# Patient Record
Sex: Female | Born: 1955
Health system: Southern US, Community
[De-identification: ages and names within clinical notes are randomized; demographics above are authoritative.]

## PROBLEM LIST (undated history)

## (undated) DIAGNOSIS — K219 Gastro-esophageal reflux disease without esophagitis: Secondary | ICD-10-CM

## (undated) DIAGNOSIS — K222 Esophageal obstruction: Secondary | ICD-10-CM

## (undated) DIAGNOSIS — F32A Depression, unspecified: Secondary | ICD-10-CM

## (undated) DIAGNOSIS — M1990 Unspecified osteoarthritis, unspecified site: Secondary | ICD-10-CM

## (undated) DIAGNOSIS — R519 Headache, unspecified: Secondary | ICD-10-CM

## (undated) DIAGNOSIS — K5900 Constipation, unspecified: Secondary | ICD-10-CM

## (undated) DIAGNOSIS — Z8489 Family history of other specified conditions: Secondary | ICD-10-CM

## (undated) DIAGNOSIS — E559 Vitamin D deficiency, unspecified: Secondary | ICD-10-CM

## (undated) DIAGNOSIS — E538 Deficiency of other specified B group vitamins: Secondary | ICD-10-CM

## (undated) DIAGNOSIS — G500 Trigeminal neuralgia: Secondary | ICD-10-CM

## (undated) DIAGNOSIS — T7840XA Allergy, unspecified, initial encounter: Secondary | ICD-10-CM

## (undated) DIAGNOSIS — H269 Unspecified cataract: Secondary | ICD-10-CM

## (undated) DIAGNOSIS — F419 Anxiety disorder, unspecified: Secondary | ICD-10-CM

## (undated) DIAGNOSIS — R51 Headache: Secondary | ICD-10-CM

## (undated) DIAGNOSIS — F329 Major depressive disorder, single episode, unspecified: Secondary | ICD-10-CM

## (undated) DIAGNOSIS — H35342 Macular cyst, hole, or pseudohole, left eye: Secondary | ICD-10-CM

## (undated) DIAGNOSIS — K760 Fatty (change of) liver, not elsewhere classified: Secondary | ICD-10-CM

## (undated) HISTORY — DX: Vitamin D deficiency, unspecified: E55.9

## (undated) HISTORY — DX: Anxiety disorder, unspecified: F41.9

## (undated) HISTORY — PX: ESOPHAGOGASTRODUODENOSCOPY (EGD) WITH ESOPHAGEAL DILATION: SHX5812

## (undated) HISTORY — DX: Esophageal obstruction: K22.2

## (undated) HISTORY — PX: OTHER SURGICAL HISTORY: SHX169

## (undated) HISTORY — PX: APPENDECTOMY: SHX54

## (undated) HISTORY — PX: COLONOSCOPY: SHX174

## (undated) HISTORY — PX: EYE SURGERY: SHX253

## (undated) HISTORY — DX: Allergy, unspecified, initial encounter: T78.40XA

## (undated) HISTORY — DX: Trigeminal neuralgia: G50.0

## (undated) HISTORY — DX: Deficiency of other specified B group vitamins: E53.8

## (undated) HISTORY — DX: Depression, unspecified: F32.A

## (undated) HISTORY — DX: Major depressive disorder, single episode, unspecified: F32.9

## (undated) HISTORY — PX: ABDOMINAL HYSTERECTOMY: SHX81

---

## 2010-09-08 ENCOUNTER — Other Ambulatory Visit (HOSPITAL_COMMUNITY): Payer: Self-pay | Admitting: Podiatry

## 2010-09-08 DIAGNOSIS — M79672 Pain in left foot: Secondary | ICD-10-CM

## 2010-09-08 DIAGNOSIS — M8430XA Stress fracture, unspecified site, initial encounter for fracture: Secondary | ICD-10-CM

## 2010-09-15 ENCOUNTER — Encounter (HOSPITAL_COMMUNITY)
Admission: RE | Admit: 2010-09-15 | Discharge: 2010-09-15 | Disposition: A | Discharge status: Home / Self Care | Payer: BC Managed Care – PPO | Source: Ambulatory Visit | Attending: Podiatry | Admitting: Podiatry

## 2010-09-15 ENCOUNTER — Other Ambulatory Visit (HOSPITAL_COMMUNITY): Payer: Self-pay | Admitting: Podiatry

## 2010-09-15 ENCOUNTER — Ambulatory Visit (HOSPITAL_COMMUNITY)
Admission: RE | Admit: 2010-09-15 | Discharge: 2010-09-15 | Disposition: A | Discharge status: Home / Self Care | Payer: BC Managed Care – PPO | Source: Ambulatory Visit | Attending: Podiatry | Admitting: Podiatry

## 2010-09-15 DIAGNOSIS — M8430XA Stress fracture, unspecified site, initial encounter for fracture: Secondary | ICD-10-CM

## 2010-09-15 DIAGNOSIS — M79672 Pain in left foot: Secondary | ICD-10-CM

## 2010-09-15 DIAGNOSIS — S92309A Fracture of unspecified metatarsal bone(s), unspecified foot, initial encounter for closed fracture: Secondary | ICD-10-CM | POA: Insufficient documentation

## 2010-09-15 DIAGNOSIS — X58XXXA Exposure to other specified factors, initial encounter: Secondary | ICD-10-CM | POA: Insufficient documentation

## 2010-09-15 DIAGNOSIS — M79609 Pain in unspecified limb: Secondary | ICD-10-CM | POA: Insufficient documentation

## 2010-09-15 DIAGNOSIS — W208XXA Other cause of strike by thrown, projected or falling object, initial encounter: Secondary | ICD-10-CM | POA: Insufficient documentation

## 2010-09-15 MED ORDER — TECHNETIUM TC 99M MEDRONATE IV KIT
23.5000 | PACK | Freq: Once | INTRAVENOUS | Status: AC | PRN
  Administered 2010-09-15: 23.5 via INTRAVENOUS

## 2010-09-18 ENCOUNTER — Other Ambulatory Visit: Payer: Self-pay | Admitting: Podiatry

## 2010-09-18 DIAGNOSIS — T07XXXA Unspecified multiple injuries, initial encounter: Secondary | ICD-10-CM

## 2010-09-24 ENCOUNTER — Ambulatory Visit
Admission: RE | Admit: 2010-09-24 | Discharge: 2010-09-24 | Disposition: A | Discharge status: Home / Self Care | Payer: BC Managed Care – PPO | Source: Ambulatory Visit | Attending: Podiatry | Admitting: Podiatry

## 2010-09-24 DIAGNOSIS — T07XXXA Unspecified multiple injuries, initial encounter: Secondary | ICD-10-CM

## 2010-10-20 ENCOUNTER — Other Ambulatory Visit (HOSPITAL_COMMUNITY): Payer: Self-pay | Admitting: Internal Medicine

## 2010-10-20 ENCOUNTER — Ambulatory Visit (HOSPITAL_COMMUNITY)
Admission: RE | Admit: 2010-10-20 | Discharge: 2010-10-20 | Disposition: A | Discharge status: Home / Self Care | Payer: BC Managed Care – PPO | Source: Ambulatory Visit | Attending: Internal Medicine | Admitting: Internal Medicine

## 2010-10-20 DIAGNOSIS — R059 Cough, unspecified: Secondary | ICD-10-CM | POA: Insufficient documentation

## 2010-10-20 DIAGNOSIS — R05 Cough: Secondary | ICD-10-CM

## 2012-12-20 ENCOUNTER — Encounter (INDEPENDENT_AMBULATORY_CARE_PROVIDER_SITE_OTHER): Payer: Self-pay | Admitting: Ophthalmology

## 2013-01-04 ENCOUNTER — Encounter (INDEPENDENT_AMBULATORY_CARE_PROVIDER_SITE_OTHER): Payer: 59 | Admitting: Ophthalmology

## 2013-01-04 DIAGNOSIS — H35349 Macular cyst, hole, or pseudohole, unspecified eye: Secondary | ICD-10-CM

## 2013-01-04 DIAGNOSIS — H43819 Vitreous degeneration, unspecified eye: Secondary | ICD-10-CM

## 2013-01-04 DIAGNOSIS — H2510 Age-related nuclear cataract, unspecified eye: Secondary | ICD-10-CM

## 2013-05-08 ENCOUNTER — Ambulatory Visit (INDEPENDENT_AMBULATORY_CARE_PROVIDER_SITE_OTHER): Payer: 59 | Admitting: Ophthalmology

## 2013-05-08 DIAGNOSIS — H35349 Macular cyst, hole, or pseudohole, unspecified eye: Secondary | ICD-10-CM

## 2013-05-08 DIAGNOSIS — H2510 Age-related nuclear cataract, unspecified eye: Secondary | ICD-10-CM

## 2013-05-08 DIAGNOSIS — H43819 Vitreous degeneration, unspecified eye: Secondary | ICD-10-CM

## 2013-05-28 ENCOUNTER — Encounter: Payer: Self-pay | Admitting: *Deleted

## 2013-05-28 DIAGNOSIS — F339 Major depressive disorder, recurrent, unspecified: Secondary | ICD-10-CM | POA: Insufficient documentation

## 2013-05-28 DIAGNOSIS — F325 Major depressive disorder, single episode, in full remission: Secondary | ICD-10-CM | POA: Insufficient documentation

## 2013-05-28 DIAGNOSIS — F329 Major depressive disorder, single episode, unspecified: Secondary | ICD-10-CM | POA: Insufficient documentation

## 2013-05-31 ENCOUNTER — Encounter: Payer: Self-pay | Admitting: Emergency Medicine

## 2013-05-31 ENCOUNTER — Encounter (INDEPENDENT_AMBULATORY_CARE_PROVIDER_SITE_OTHER): Payer: Self-pay

## 2013-05-31 ENCOUNTER — Ambulatory Visit (INDEPENDENT_AMBULATORY_CARE_PROVIDER_SITE_OTHER): Payer: 59 | Admitting: Emergency Medicine

## 2013-05-31 VITALS — BP 106/62 | HR 70 | Temp 98.2°F | Resp 16 | Ht 65.0 in | Wt 183.0 lb

## 2013-05-31 DIAGNOSIS — F329 Major depressive disorder, single episode, unspecified: Secondary | ICD-10-CM

## 2013-05-31 DIAGNOSIS — F32A Depression, unspecified: Secondary | ICD-10-CM

## 2013-05-31 DIAGNOSIS — Z23 Encounter for immunization: Secondary | ICD-10-CM

## 2013-05-31 DIAGNOSIS — K5900 Constipation, unspecified: Secondary | ICD-10-CM

## 2013-05-31 DIAGNOSIS — E559 Vitamin D deficiency, unspecified: Secondary | ICD-10-CM | POA: Insufficient documentation

## 2013-05-31 DIAGNOSIS — R635 Abnormal weight gain: Secondary | ICD-10-CM

## 2013-05-31 DIAGNOSIS — F419 Anxiety disorder, unspecified: Secondary | ICD-10-CM

## 2013-05-31 DIAGNOSIS — Z0000 Encounter for general adult medical examination without abnormal findings: Secondary | ICD-10-CM

## 2013-05-31 DIAGNOSIS — Z111 Encounter for screening for respiratory tuberculosis: Secondary | ICD-10-CM

## 2013-05-31 DIAGNOSIS — E538 Deficiency of other specified B group vitamins: Secondary | ICD-10-CM

## 2013-05-31 LAB — BASIC METABOLIC PANEL WITH GFR
BUN: 14 mg/dL (ref 6–23)
CO2: 28 mEq/L (ref 19–32)
Calcium: 9.3 mg/dL (ref 8.4–10.5)
Chloride: 102 mEq/L (ref 96–112)
Creat: 0.6 mg/dL (ref 0.50–1.10)
GFR, Est African American: >89 mL/min
GFR, Est Non African American: >89 mL/min
Glucose, Bld: 72 mg/dL (ref 70–99)
Potassium: 4.2 mEq/L (ref 3.5–5.3)
Sodium: 142 mEq/L (ref 135–145)

## 2013-05-31 LAB — CBC WITH DIFFERENTIAL/PLATELET
Basophils Absolute: 0 10*3/uL (ref 0.0–0.1)
Basophils Relative: 1 % (ref 0–1)
Eosinophils Absolute: 0.1 10*3/uL (ref 0.0–0.7)
Eosinophils Relative: 1 % (ref 0–5)
HCT: 37.9 % (ref 36.0–46.0)
Hemoglobin: 12.9 g/dL (ref 12.0–15.0)
Lymphocytes Relative: 40 % (ref 12–46)
Lymphs Abs: 2.5 10*3/uL (ref 0.7–4.0)
MCH: 30.1 pg (ref 26.0–34.0)
MCHC: 34 g/dL (ref 30.0–36.0)
MCV: 88.3 fL (ref 78.0–100.0)
Monocytes Absolute: 0.4 10*3/uL (ref 0.1–1.0)
Monocytes Relative: 7 % (ref 3–12)
Neutro Abs: 3.2 10*3/uL (ref 1.7–7.7)
Neutrophils Relative %: 51 % (ref 43–77)
Platelets: 327 10*3/uL (ref 150–400)
RBC: 4.29 MIL/uL (ref 3.87–5.11)
RDW: 13.9 % (ref 11.5–15.5)
WBC: 6.3 10*3/uL (ref 4.0–10.5)

## 2013-05-31 LAB — HEPATIC FUNCTION PANEL
ALT: 14 U/L (ref 0–35)
AST: 14 U/L (ref 0–37)
Albumin: 4.3 g/dL (ref 3.5–5.2)
Alkaline Phosphatase: 72 U/L (ref 39–117)
Bilirubin, Direct: 0.1 mg/dL (ref 0.0–0.3)
Indirect Bilirubin: 0.3 mg/dL (ref 0.0–0.9)
Total Bilirubin: 0.4 mg/dL (ref 0.3–1.2)
Total Protein: 7.2 g/dL (ref 6.0–8.3)

## 2013-05-31 LAB — VITAMIN B12: Vitamin B-12: >2000 pg/mL — ABNORMAL HIGH (ref 211–911)

## 2013-05-31 LAB — LIPID PANEL
Cholesterol: 186 mg/dL (ref 0–200)
HDL: 64 mg/dL (ref >39)
LDL Cholesterol: 109 mg/dL — ABNORMAL HIGH (ref 0–99)
Total CHOL/HDL Ratio: 2.9 Ratio
Triglycerides: 67 mg/dL (ref <150)
VLDL: 13 mg/dL (ref 0–40)

## 2013-05-31 LAB — IRON AND TIBC
%SAT: 23 % (ref 20–55)
Iron: 81 ug/dL (ref 42–145)
TIBC: 349 ug/dL (ref 250–470)
UIBC: 268 ug/dL (ref 125–400)

## 2013-05-31 LAB — TSH: TSH: 1.043 u[IU]/mL (ref 0.350–4.500)

## 2013-05-31 LAB — HEMOGLOBIN A1C
Hgb A1c MFr Bld: 5.8 % — ABNORMAL HIGH (ref <5.7)
Mean Plasma Glucose: 120 mg/dL — ABNORMAL HIGH (ref <117)

## 2013-05-31 LAB — MAGNESIUM: Magnesium: 2 mg/dL (ref 1.5–2.5)

## 2013-05-31 MED ORDER — LORAZEPAM 0.5 MG PO TABS: 0.5000 mg | ORAL_TABLET | Freq: Two times a day (BID) | ORAL | Status: DC

## 2013-05-31 NOTE — Progress Notes (Signed)
Subjective:    Patient ID: Margaret Gilmore, female    DOB: 09/24/1955, 58 y.o.   MRN: [id number]  HPI Comments: 58 yo female CPE.She has not had a CPE at this office. She notes stress level has increased with work and occasionally takes a Lorazepam of her mothers and notes it helps. She was unable to tolerate zoloft in past. She would like her own RX of Lorazepam for PRN use. She notes occasional difficulty with sleep. She is not exercising routinely. She eats descent. LAST LABS all WNL except ViT D was low. She added vit d AD.  She notes + Constipation. She thinks/ feels like gas comes out of vaginal area. She notes questionable fissure in the past which resolved. She is overdue for her colonoscopy but notes no polyps [place]     Medication List       This list is accurate as of: 05/31/13 11:59 PM.  Always use your most recent med list.               cyanocobalamin 100 MCG tablet  Take 1,000 mcg by mouth daily.     LORazepam 0.5 MG tablet  Commonly known as:  ATIVAN  Take 1 tablet (0.5 mg total) by mouth 2 (two) times daily.     Vitamin D 2000 UNITS tablet  Take 2,000 Units by mouth daily.       ALLERGIES Eggs or egg-derived products; Lyrica; Sulfa antibiotics; and Zoloft  Past Medical History  Diagnosis Date  . Depression   . Unspecified vitamin D deficiency   . Anxiety   . B12 deficiency     Past Surgical History  Procedure Laterality Date  . Abdominal hysterectomy    . Cesarean section    . Trigeminal      History  Substance Use Topics  . Smoking status: Never Smoker   . Smokeless tobacco: Not on file  . Alcohol Use: No   Family History  Problem Relation Age of Onset  . Cancer Father     pancreatic  . Hyperlipidemia Father   . Heart disease Father   . Hypertension Father   . Diabetes Mother   . COPD Mother   . Hyperlipidemia Mother   . Hypertension Brother   . Dementia Maternal Grandmother   . Cancer Maternal  Grandfather     bone  . Cancer Maternal Aunt     BREAST  . Cancer Paternal Aunt     HODGKINS  . Cancer Paternal Uncle     BONE      Review of Systems  HENT: Positive for postnasal drip.        [place] 2008- WNL  Eyes:       [place]- 05/31/13 APT TODAY [facility]- RETINAL HOLE HEALING 2 WEEKS AGO  Gastrointestinal: Positive for constipation.       COLONOSCOPY 11 YEARS AGO [place]  Genitourinary:       LAST PAP WNL 2004 (AFTER THE COMPLETE HYSTERECTOMY) PATIENT NOTES SHE HAD 2 SETS OF FEMALE ORGANS (UTERUS X 2, OVARIES X 4)  Musculoskeletal:       PETRY- 2012 BONE DENSITY NEG 2012 WITH FOOT STRESS FX  Skin:       [place]ULL [place]  Neurological:       SMIDT- 30 YEARS AGO FOR TRIGEMINAL  Psychiatric/Behavioral: The patient is nervous/anxious.   All other systems reviewed and are negative.  BP 106/62  Pulse 70  Temp(Src) 98.2 F (36.8 C) (Temporal)  Resp 16  Ht 5\' 5"  (1.651 m)  Wt 183 lb (83.008 kg)  BMI 30.45 kg/m2     Objective:   Physical Exam  Nursing note and vitals reviewed. Constitutional: She is oriented to person, place, and time. She appears well-developed and well-nourished. No distress.  Over wt  HENT:  Head: Normocephalic and atraumatic.  Right Ear: External ear normal.  Left Ear: External ear normal.  Nose: Nose normal.  Mouth/Throat: Oropharynx is clear and moist. No oropharyngeal exudate.  Eyes: Conjunctivae and EOM are normal. Pupils are equal, round, and reactive to light. Right eye exhibits no discharge. Left eye exhibits no discharge. No scleral icterus.  Neck: Normal range of motion. Neck supple. No JVD present. No tracheal deviation present. No thyromegaly present.  Cardiovascular: Normal rate, regular rhythm, normal heart sounds and intact distal pulses.   Pulmonary/Chest: Effort normal and breath sounds normal.  Abdominal: Soft. Bowel sounds are normal. She exhibits no distension and no mass. There  is no tenderness. There is no rebound and no guarding.  Genitourinary:  DEF GYN  Musculoskeletal: Normal range of motion. She exhibits no edema and no tenderness.  Lymphadenopathy:    She has no cervical adenopathy.  Neurological: She is alert and oriented to person, place, and time. She has normal reflexes. No cranial nerve deficit. She exhibits normal muscle tone. Coordination normal.  Skin: Skin is warm and dry. No rash noted. No erythema. No pallor.  Psychiatric: She has a normal mood and affect. Her behavior is normal. Judgment and thought content normal.     EKG NSCSPT WNL     Assessment & Plan:  1. CPE- Overwt-Check labs 2. Wt gain/ anxiety/ depression- Check labs, increase activity and water intake. Lorazepam .5 mg AD 3. Constipation- Increase fiber/ water intake, decrease caffeine, increase activity level, can add Miralax or Metamucil QD until BM soft. NEEDS UPDATED COLONOSCOPY REF TO GI

## 2013-05-31 NOTE — Patient Instructions (Addendum)
Tetanus, Diphtheria, Pertussis (Tdap) Vaccine What You Need to Know WHY GET VACCINATED? Tetanus, diphtheria and pertussis can be very serious diseases, even for adolescents and adults. Tdap vaccine can protect us from these diseases. TETANUS (Lockjaw) causes painful muscle tightening and stiffness, usually all over the body.  It can lead to tightening of muscles in the head and neck so you can't open your mouth, swallow, or sometimes even breathe. Tetanus kills about 1 out of 5 people who are infected. DIPHTHERIA can cause a thick coating to form in the back of the throat.  It can lead to breathing problems, paralysis, heart failure, and death. PERTUSSIS (Whooping Cough) causes severe coughing spells, which can cause difficulty breathing, vomiting and disturbed sleep.  It can also lead to weight loss, incontinence, and rib fractures. Up to 2 in 100 adolescents and 5 in 100 adults with pertussis are hospitalized or have complications, which could include pneumonia and death. These diseases are caused by bacteria. Diphtheria and pertussis are spread from person to person through coughing or sneezing. Tetanus enters the body through cuts, scratches, or wounds. Before vaccines, the [place] saw as many as 200,000 cases a year of diphtheria and pertussis, and hundreds of cases of tetanus. Since vaccination began, tetanus and diphtheria have dropped by about 99% and pertussis by about 80%. TDAP VACCINE Tdap vaccine can protect adolescents and adults from tetanus, diphtheria, and pertussis. One dose of Tdap is routinely given at age 11 or 12. People who did not get Tdap at that age should get it as soon as possible. Tdap is especially important for health care professionals and anyone having close contact with a baby younger than 12 months. Pregnant women should get a dose of Tdap during every pregnancy, to protect the newborn from pertussis. Infants are most at risk for severe, life-threatening  complications from pertussis. A similar vaccine, called Td, protects from tetanus and diphtheria, but not pertussis. A Td booster should be given every 10 years. Tdap may be given as one of these boosters if you have not already gotten a dose. Tdap may also be given after a severe cut or burn to prevent tetanus infection. Your doctor can give you more information. Tdap may safely be given at the same time as other vaccines. SOME PEOPLE SHOULD NOT GET THIS VACCINE  If you ever had a life-threatening allergic reaction after a dose of any tetanus, diphtheria, or pertussis containing vaccine, OR if you have a severe allergy to any part of this vaccine, you should not get Tdap. Tell your doctor if you have any severe allergies.  If you had a coma, or long or multiple seizures within 7 days after a childhood dose of DTP or DTaP, you should not get Tdap, unless a cause other than the vaccine was found. You can still get Td.  Talk to your doctor if you:  have epilepsy or another nervous system problem,  had severe pain or swelling after any vaccine containing diphtheria, tetanus or pertussis,  ever had Guillain-Barr Syndrome (GBS),  aren't feeling well on the day the shot is scheduled. RISKS OF A VACCINE REACTION With any medicine, including vaccines, there is a chance of side effects. These are usually mild and go away on their own, but serious reactions are also possible. Brief fainting spells can follow a vaccination, leading to injuries from falling. Sitting or lying down for about 15 minutes can help prevent these. Tell your doctor if you feel dizzy or light-headed, or   have vision changes or ringing in the ears. Mild problems following Tdap (Did not interfere with activities)  Pain where the shot was given (about 3 in 4 adolescents or 2 in 3 adults)  Redness or swelling where the shot was given (about 1 person in 5)  Mild fever of at least 100.20F (up to about 1 in 25 adolescents or 1 in  100 adults)  Headache (about 3 or 4 people in 10)  Tiredness (about 1 person in 3 or 4)  Nausea, vomiting, diarrhea, stomach ache (up to 1 in 4 adolescents or 1 in 10 adults)  Chills, body aches, sore joints, rash, swollen glands (uncommon) Moderate problems following Tdap (Interfered with activities, but did not require medical attention)  Pain where the shot was given (about 1 in 5 adolescents or 1 in 100 adults)  Redness or swelling where the shot was given (up to about 1 in 16 adolescents or 1 in 25 adults)  Fever over 102F (about 1 in 100 adolescents or 1 in 250 adults)  Headache (about 3 in 20 adolescents or 1 in 10 adults)  Nausea, vomiting, diarrhea, stomach ache (up to 1 or 3 people in 100)  Swelling of the entire arm where the shot was given (up to about 3 in 100). Severe problems following Tdap (Unable to perform usual activities, required medical attention)  Swelling, severe pain, bleeding and redness in the arm where the shot was given (rare). A severe allergic reaction could occur after any vaccine (estimated less than 1 in a million doses). WHAT IF THERE IS A SERIOUS REACTION? What should I look for?  Look for anything that concerns you, such as signs of a severe allergic reaction, very high fever, or behavior changes. Signs of a severe allergic reaction can include hives, swelling of the face and throat, difficulty breathing, a fast heartbeat, dizziness, and weakness. These would start a few minutes to a few hours after the vaccination. What should I do?  If you think it is a severe allergic reaction or other emergency that can't wait, call 9-1-1 or get the person to the nearest hospital. Otherwise, call your doctor.  Afterward, the reaction should be reported to the "Vaccine Adverse Event Reporting System" (VAERS). Your doctor might file this report, or you can do it yourself through the VAERS web site at www.vaers.SamedayNews.es, or by calling [phone]. VAERS is  only for reporting reactions. They do not give medical advice.  THE NATIONAL VACCINE INJURY COMPENSATION PROGRAM The National Vaccine Injury Compensation Program (VICP) is a federal program that was created to compensate people who may have been injured by certain vaccines. Persons who believe they may have been injured by a vaccine can learn about the program and about filing a claim by calling 651-569-0839 or visiting the [employer] website at GoldCloset.com.ee. HOW CAN I LEARN MORE?  Ask your doctor.  Call your local or state health department.  Contact the Centers for Disease Control and Prevention (CDC):  Call (203)708-0551 or visit CDC's website at http://hunter.com/. CDC Tdap Vaccine VIS (09/24/11) Document Released: 11/03/2011 Document Revised: 08/29/2012 Document Reviewed: 08/24/2012 [facility] Patient Information 2014 [place], [place]. Depression, Adult Depression is feeling sad, low, down in the dumps, blue, gloomy, or empty. In general, there are two kinds of depression:  Normal sadness or grief. This can happen after something upsetting. It often goes away on its own within 2 weeks. After losing a loved one (bereavement), normal sadness and grief may last longer than two weeks. It  usually gets better with time.  Clinical depression. This kind lasts longer than normal sadness or grief. It keeps you from doing the things you normally do in life. It is often hard to function at home, work, or at school. It may affect your relationships with others. Treatment is often needed. GET HELP RIGHT AWAY IF:  You have thoughts about hurting yourself or others.  You lose touch with reality (psychotic symptoms). You may:  See or hear things that are not real.  Have untrue beliefs about your life or people around you.  Your medicine is giving you problems. MAKE SURE YOU:  Understand these instructions.  Will watch your condition.  Will get help right away if you are not  doing well or get worse. Document Released: 06/06/2010 Document Revised: 01/27/2012 Document Reviewed: 09/03/2011 [facility] Patient Information 2014 [place], [place]. Constipation, Adult Constipation is when a person:  Poops (bowel movement) less than 3 times a week.  Has a hard time pooping.  Has poop that is dry, hard, or bigger than normal. HOME CARE   Eat more fiber, such as fruits, vegetables, whole grains like brown rice, and beans.  Eat less fatty foods and sugar. This includes [place] fries, hamburgers, cookies, candy, and soda.  If you are not getting enough fiber from food, take products with added fiber in them (supplements).  Drink enough fluid to keep your pee (urine) clear or pale yellow.  Go to the restroom when you feel like you need to poop. Do not hold it.  Only take medicine as told by your doctor. Do not take medicines that help you poop (laxatives) without talking to your doctor first.  Exercise on a regular basis, or as told by your doctor. GET HELP RIGHT AWAY IF:   You have bright red blood in your poop (stool).  Your constipation lasts more than 4 days or gets worse.  You have belly (abdomen) or butt (rectal) pain.  You have thin poop (as thin as a pencil).  You lose weight, and it cannot be explained. MAKE SURE YOU:   Understand these instructions.  Will watch your condition.  Will get help right away if you are not doing well or get worse. Document Released: 10/21/2007 Document Revised: 07/27/2011 Document Reviewed: 02/13/2013 [facility] Patient Information 2014 [place], [place].

## 2013-06-01 ENCOUNTER — Encounter: Payer: Self-pay | Admitting: Gastroenterology

## 2013-06-01 LAB — INSULIN, FASTING: Insulin fasting, serum: 10 u[IU]/mL (ref 3–28)

## 2013-06-01 LAB — MICROALBUMIN / CREATININE URINE RATIO
Creatinine, Urine: 53.8 mg/dL
Microalb Creat Ratio: 9.3 mg/g (ref 0.0–30.0)
Microalb, Ur: 0.5 mg/dL (ref 0.00–1.89)

## 2013-06-01 LAB — URINALYSIS, ROUTINE W REFLEX MICROSCOPIC
Bilirubin Urine: NEGATIVE
Glucose, UA: NEGATIVE mg/dL
Hgb urine dipstick: NEGATIVE
Ketones, ur: NEGATIVE mg/dL
Leukocytes, UA: NEGATIVE
Nitrite: NEGATIVE
Protein, ur: NEGATIVE mg/dL
Specific Gravity, Urine: 1.01 (ref 1.005–1.030)
Urobilinogen, UA: 0.2 mg/dL (ref 0.0–1.0)
pH: 6 (ref 5.0–8.0)

## 2013-06-01 LAB — FOLATE RBC: RBC Folate: 565 ng/mL (ref >280)

## 2013-06-01 LAB — VITAMIN D 25 HYDROXY (VIT D DEFICIENCY, FRACTURES): Vit D, 25-Hydroxy: 50 ng/mL (ref 30–89)

## 2013-06-02 LAB — TB SKIN TEST
Induration: 0 mm
TB Skin Test: NEGATIVE

## 2013-06-21 ENCOUNTER — Ambulatory Visit: Payer: BC Managed Care – PPO | Admitting: Gastroenterology

## 2013-06-21 ENCOUNTER — Encounter: Payer: Self-pay | Admitting: Gastroenterology

## 2013-06-21 ENCOUNTER — Ambulatory Visit (INDEPENDENT_AMBULATORY_CARE_PROVIDER_SITE_OTHER): Payer: 59 | Admitting: Gastroenterology

## 2013-06-21 VITALS — BP 104/70 | HR 77 | Ht 65.0 in | Wt 181.0 lb

## 2013-06-21 DIAGNOSIS — Z1211 Encounter for screening for malignant neoplasm of colon: Secondary | ICD-10-CM

## 2013-06-21 DIAGNOSIS — K5900 Constipation, unspecified: Secondary | ICD-10-CM

## 2013-06-21 DIAGNOSIS — R1319 Other dysphagia: Secondary | ICD-10-CM

## 2013-06-21 MED ORDER — PEG-KCL-NACL-NASULF-NA ASC-C 100 G PO SOLR: 1.0000 | Freq: Once | ORAL | Status: DC

## 2013-06-21 NOTE — Progress Notes (Addendum)
    History of Present Illness: This is a 58 year old female with chronic constipation. She relates a one year history of worsening solid food dysphagia. She states that she had a colonoscopy performed in 2003 in [place]. The only finding was apparently mild diverticulosis. Denies weight loss, abdominal pain, diarrhea, change in stool caliber, melena, hematochezia, nausea, vomiting, reflux symptoms, chest pain.  Review of Systems: Pertinent positive and negative review of systems were noted in the above HPI section. All other review of systems were otherwise negative.  Current Medications, Allergies, Past Medical History, Past Surgical History, Family History and Social History were reviewed in [employer] record.  Physical Exam: General: Well developed , well nourished, no acute distress Head: Normocephalic and atraumatic Eyes:  sclerae anicteric, EOMI Ears: Normal auditory acuity Mouth: No deformity or lesions Neck: Supple, no masses or thyromegaly Lungs: Clear throughout to auscultation Heart: Regular rate and rhythm; no murmurs, rubs or bruits Abdomen: Soft, non tender and non distended. No masses, hepatosplenomegaly or hernias noted. Normal Bowel sounds Rectal: Deferred to colonoscopy Musculoskeletal: Symmetrical with no gross deformities  Skin: No lesions on visible extremities Pulses:  Normal pulses noted Extremities: No clubbing, cyanosis, edema or deformities noted Neurological: Alert oriented x 4, grossly nonfocal Cervical Nodes:  No significant cervical adenopathy Inguinal Nodes: No significant inguinal adenopathy Psychological:  Alert and cooperative. Normal mood and affect  Assessment and Recommendations:  1. Dysphagia to solids. Suspected esophageal stricture. The risks, benefits, and alternatives to endoscopy with possible biopsy and possible dilation were discussed with the patient and they consent to proceed.   2. CRC screening, routine  risk. The risks, benefits, and alternatives to colonoscopy with possible biopsy and possible polypectomy were discussed with the patient and they consent to proceed.   3. Chronic constipation. Miralax qd to bid titrated for adequate bowel movements.

## 2013-06-21 NOTE — Patient Instructions (Signed)
Start taking Miralax over the counter mixing 17 grams in 8 oz of water 1-2 x daily. Make sure you take Miralax every day x 7 days leading up to your procedures.   You have been scheduled for an endoscopy and colonoscopy with propofol. Please follow the written instructions given to you at your visit today. Please pick up your prep at the pharmacy within the next 1-3 days. If you use inhalers (even only as needed), please bring them with you on the day of your procedure. Your physician has requested that you go to www.startemmi.com and enter the access code given to you at your visit today. This web site gives a general overview about your procedure. However, you should still follow specific instructions given to you by our office regarding your preparation for the procedure.  Thank you for choosing me and [facility].  Pricilla Riffle. Dagoberto Ligas., MD., Marval Regal  cc: Unk Pinto, MD

## 2013-06-22 ENCOUNTER — Encounter: Payer: Self-pay | Admitting: Gastroenterology

## 2013-07-27 ENCOUNTER — Other Ambulatory Visit: Payer: Self-pay | Admitting: Emergency Medicine

## 2013-08-11 ENCOUNTER — Encounter: Payer: 59 | Admitting: Gastroenterology

## 2013-08-16 ENCOUNTER — Telehealth: Payer: Self-pay | Admitting: *Deleted

## 2013-08-25 ENCOUNTER — Ambulatory Visit (AMBULATORY_SURGERY_CENTER): Payer: 59 | Admitting: Gastroenterology

## 2013-08-25 ENCOUNTER — Encounter: Payer: Self-pay | Admitting: Gastroenterology

## 2013-08-25 VITALS — BP 127/70 | HR 61 | Temp 97.8°F | Resp 12 | Ht 65.0 in | Wt 181.0 lb

## 2013-08-25 DIAGNOSIS — D128 Benign neoplasm of rectum: Secondary | ICD-10-CM

## 2013-08-25 DIAGNOSIS — D129 Benign neoplasm of anus and anal canal: Secondary | ICD-10-CM

## 2013-08-25 DIAGNOSIS — R1319 Other dysphagia: Secondary | ICD-10-CM

## 2013-08-25 DIAGNOSIS — D126 Benign neoplasm of colon, unspecified: Secondary | ICD-10-CM

## 2013-08-25 DIAGNOSIS — Z1211 Encounter for screening for malignant neoplasm of colon: Secondary | ICD-10-CM

## 2013-08-25 MED ORDER — OMEPRAZOLE 40 MG PO CPDR: 40.0000 mg | DELAYED_RELEASE_CAPSULE | Freq: Every day | ORAL | Status: DC

## 2013-08-25 MED ORDER — SODIUM CHLORIDE 0.9 % IV SOLN: 500.0000 mL | INTRAVENOUS | Status: DC

## 2013-08-25 NOTE — Progress Notes (Signed)
Procedure ewnds, to recovery, report given and VSS. 

## 2013-08-25 NOTE — Op Note (Addendum)
[place]  [address] [place], [id number]   ENDOSCOPY PROCEDURE REPORT  PATIENT: Margaret Gilmore, Margaret Gilmore  MR#: [id number] BIRTHDATE: 22-Mar-1956 , 58  yrs. old GENDER: Female ENDOSCOPIST: Ladene Artist, MD, [facility] REFERRED BY: Kelby Aline, PA PROCEDURE DATE:  08/25/2013 PROCEDURE:   EGD with dilatation over guidewire ASA CLASS:   Class II INDICATIONS:dysphagia. MEDICATIONS: There was residual sedation effect present from prior procedure, MAC sedation, administered by CRNA, and propofol (Diprivan) 150mg  IV TOPICAL ANESTHETIC:   none DESCRIPTION OF PROCEDURE:   After the risks benefits and alternatives of the procedure were thoroughly explained, informed consent was obtained.  The     endoscope was introduced through the mouth  and advanced to the descending duodenum ,      The instrument was slowly withdrawn as the mucosa was carefully examined.  ESOPHAGUS: There was LA Class A esophagitis noted.   A stricture was found at the gastroesophageal junction.  The stenosis was traversable with the endoscope. It measured about 14 mm in diameter.  The esophagus was otherwise normal. STOMACH: There was mild non erosive gastritis in the antrum. The gastric mucosa otherwise appeared normal. DUODENUM: The duodenal mucosa showed no abnormalities in the bulb and second portion of the duodenum.     Dilation was then performed at the gastroesphageal junction Dilator:Savary over guidewire Size:17 mm  Reststance:minimal Heme:none  COMPLICATIONS: There were no complications.  ENDOSCOPIC IMPRESSION: 1.    LA Class A esophagitis 2.   Stricture at the gastroesophageal junction 3.    Mild, nonerosive antral gastritis  RECOMMENDATIONS: 1.  anti-reflux regimen long term 2.  PPI qam long term: omeprazole 40 mg po qam, 1 year of refils 3.  post dilation instructions  eSigned:  Ladene Artist, MD, [facility] 08/25/2013 2:09 PM Revised: 08/25/2013 2:09 PM

## 2013-08-25 NOTE — Progress Notes (Signed)
Called to room to assist during endoscopic procedure.  Patient ID and intended procedure confirmed with present staff. Received instructions for my participation in the procedure from the performing physician.  

## 2013-08-25 NOTE — Patient Instructions (Addendum)
Discharge instructions given with verbal understanding. Handouts on polyps,diverticulosis,hemorrhoids and a dilatation diet. Resume previous medications. YOU HAD AN ENDOSCOPIC PROCEDURE TODAY AT [facility]: Refer to the procedure report that was given to you for any specific questions about what was found during the examination.  If the procedure report does not answer your questions, please call your gastroenterologist to clarify.  If you requested that your care partner not be given the details of your procedure findings, then the procedure report has been included in a sealed envelope for you to review at your convenience later.  YOU SHOULD EXPECT: Some feelings of bloating in the abdomen. Passage of more gas than usual.  Walking can help get rid of the air that was put into your GI tract during the procedure and reduce the bloating. If you had a lower endoscopy (such as a colonoscopy or flexible sigmoidoscopy) you may notice spotting of blood in your stool or on the toilet paper. If you underwent a bowel prep for your procedure, then you may not have a normal bowel movement for a few days.  DIET: Your first meal following the procedure should be a light meal and then it is ok to progress to your normal diet.  A half-sandwich or bowl of soup is an example of a good first meal.  Heavy or fried foods are harder to digest and may make you feel nauseous or bloated.  Likewise meals heavy in dairy and vegetables can cause extra gas to form and this can also increase the bloating.  Drink plenty of fluids but you should avoid alcoholic beverages for 24 hours.  ACTIVITY: Your care partner should take you home directly after the procedure.  You should plan to take it easy, moving slowly for the rest of the day.  You can resume normal activity the day after the procedure however you should NOT DRIVE or use heavy machinery for 24 hours (because of the sedation medicines used during the test).     SYMPTOMS TO REPORT IMMEDIATELY: A gastroenterologist can be reached at any hour.  During normal business hours, 8:30 AM to 5:00 PM Monday through Friday, call [phone].  After hours and on weekends, please call the GI answering service at [phone] who will take a message and have the physician on call contact you.   Following lower endoscopy (colonoscopy or flexible sigmoidoscopy):  Excessive amounts of blood in the stool  Significant tenderness or worsening of abdominal pains  Swelling of the abdomen that is new, acute  Fever of 100F or higher  Following upper endoscopy (EGD)  Vomiting of blood or coffee ground material  New chest pain or pain under the shoulder blades  Painful or persistently difficult swallowing  New shortness of breath  Fever of 100F or higher  Black, tarry-looking stools  FOLLOW UP: If any biopsies were taken you will be contacted by phone or by letter within the next 1-3 weeks.  Call your gastroenterologist if you have not heard about the biopsies in 3 weeks.  Our staff will call the home number listed on your records the next business day following your procedure to check on you and address any questions or concerns that you may have at that time regarding the information given to you following your procedure. This is a courtesy call and so if there is no answer at the home number and we have not heard from you through the emergency physician on call, we will assume that  you have returned to your regular daily activities without incident.  SIGNATURES/CONFIDENTIALITY: You and/or your care partner have signed paperwork which will be entered into your electronic medical record.  These signatures attest to the fact that that the information above on your After Visit Summary has been reviewed and is understood.  Full responsibility of the confidentiality of this discharge information lies with you and/or your care-partner.

## 2013-08-25 NOTE — Progress Notes (Signed)
Pt c/o headache ( bilat temporal areas and occipital area ) on admission. Pt also has Eggs listed as allergy or intolerance ,says she stays away from eggs since they give her a headache. John Nulty,CRNA spoke with pt in admitting about this and ok'd pt for propofol and will discuss with Holland Falling, CRNA who will be giving anesthesia for pt today.

## 2013-08-25 NOTE — Op Note (Addendum)
[address]  [employer] [place], [id number]   COLONOSCOPY PROCEDURE REPORT  PATIENT: Mareli, Antunes  MR#: [id number] BIRTHDATE: 1956-02-11 , 58  yrs. old GENDER: Female ENDOSCOPIST: Ladene Artist, MD, [facility] REFERRED BY: Kelby Aline, PA PROCEDURE DATE:  08/25/2013 PROCEDURE:   Colonoscopy with biopsy First Screening Colonoscopy - Avg.  risk and is 50 yrs.  old or older - No.  Prior Negative Screening - Now for repeat screening. 10 or more years since last screening  History of Adenoma - Now for follow-up colonoscopy & has been > or = to 3 yrs.  N/A  Polyps Removed Today? Yes. ASA CLASS:   Class II INDICATIONS:average risk screening. MEDICATIONS: MAC sedation, administered by CRNA and propofol (Diprivan) 250mg  IV DESCRIPTION OF PROCEDURE:   After the risks benefits and alternatives of the procedure were thoroughly explained, informed consent was obtained.  A digital rectal exam revealed no abnormalities of the rectum.   The LB [id number]  endoscope was introduced through the anus and advanced to the cecum, which was identified by both the appendix and ileocecal valve. No adverse events experienced with a tortuous and redundant colon.   The quality of the prep was good, using MoviPrep  The instrument was then slowly withdrawn as the colon was fully examined.  COLON FINDINGS: Moderate diverticulosis was noted in the ascending colon, transverse colon, descending colon, and sigmoid colon.   A sessile polyp measuring 4 mm in size was found at the cecum.  A polypectomy was performed with cold forceps.  The resection was complete and the polyp tissue was completely retrieved.   The colon was otherwise normal.  There was no diverticulosis, inflammation, polyps or cancers unless previously stated.  Retroflexed views revealed small internal hemorrhoids. The time to cecum=6 minutes 18 seconds.  Withdrawal time=10 minutes 31 seconds.  The scope was withdrawn  and the procedure completed. COMPLICATIONS: There were no complications.  ENDOSCOPIC IMPRESSION: 1.   Moderate diverticulosis in the ascending colon, transverse colon, descending colon, and sigmoid colon 2.   Sessile polyp measuring 4 mm at the cecum; polypectomy performed with cold forceps 3.   Small internal hemorrhoids  RECOMMENDATIONS: 1.  Await pathology results 2.  High fiber diet with liberal fluid intake. 3.  Repeat colonoscopy in 5 years if polyp adenomatous; otherwise 10 years  eSigned:  Ladene Artist, MD, [facility] 08/25/2013 2:04 PM Revised: 08/25/2013 2:04 PM

## 2013-08-28 ENCOUNTER — Telehealth: Payer: Self-pay | Admitting: *Deleted

## 2013-08-28 NOTE — Telephone Encounter (Signed)
  Follow up Call-  Call back number 08/25/2013  Post procedure Call Back phone  # [phone]  Permission to leave phone message Yes     Patient questions:  Do you have a fever, pain , or abdominal swelling? no Pain Score  0 *  Have you tolerated food without any problems? yes  Have you been able to return to your normal activities? yes  Do you have any questions about your discharge instructions: Diet   no Medications  no Follow up visit  no  Do you have questions or concerns about your Care? no  Actions: * If pain score is 4 or above: No action needed, pain <4.

## 2013-08-31 ENCOUNTER — Encounter: Payer: Self-pay | Admitting: Gastroenterology

## 2013-09-13 ENCOUNTER — Encounter: Payer: Self-pay | Admitting: Emergency Medicine

## 2013-09-13 ENCOUNTER — Ambulatory Visit (INDEPENDENT_AMBULATORY_CARE_PROVIDER_SITE_OTHER): Payer: 59 | Admitting: Emergency Medicine

## 2013-09-13 VITALS — BP 112/60 | HR 64 | Temp 98.2°F | Resp 16 | Ht 65.0 in | Wt 181.0 lb

## 2013-09-13 DIAGNOSIS — E782 Mixed hyperlipidemia: Secondary | ICD-10-CM

## 2013-09-13 DIAGNOSIS — F419 Anxiety disorder, unspecified: Secondary | ICD-10-CM

## 2013-09-13 DIAGNOSIS — R7309 Other abnormal glucose: Secondary | ICD-10-CM

## 2013-09-13 DIAGNOSIS — F411 Generalized anxiety disorder: Secondary | ICD-10-CM

## 2013-09-13 LAB — CBC WITH DIFFERENTIAL/PLATELET
Basophils Absolute: 0.1 10*3/uL (ref 0.0–0.1)
Basophils Relative: 1 % (ref 0–1)
Eosinophils Absolute: 0.1 10*3/uL (ref 0.0–0.7)
Eosinophils Relative: 2 % (ref 0–5)
HCT: 36.7 % (ref 36.0–46.0)
Hemoglobin: 12.7 g/dL (ref 12.0–15.0)
Lymphocytes Relative: 40 % (ref 12–46)
Lymphs Abs: 2.8 10*3/uL (ref 0.7–4.0)
MCH: 29.3 pg (ref 26.0–34.0)
MCHC: 34.6 g/dL (ref 30.0–36.0)
MCV: 84.6 fL (ref 78.0–100.0)
Monocytes Absolute: 0.4 10*3/uL (ref 0.1–1.0)
Monocytes Relative: 6 % (ref 3–12)
Neutro Abs: 3.6 10*3/uL (ref 1.7–7.7)
Neutrophils Relative %: 51 % (ref 43–77)
Platelets: 327 10*3/uL (ref 150–400)
RBC: 4.34 MIL/uL (ref 3.87–5.11)
RDW: 13.7 % (ref 11.5–15.5)
WBC: 7.1 10*3/uL (ref 4.0–10.5)

## 2013-09-13 LAB — HEPATIC FUNCTION PANEL
ALT: 26 U/L (ref 0–35)
AST: 22 U/L (ref 0–37)
Albumin: 4.1 g/dL (ref 3.5–5.2)
Alkaline Phosphatase: 70 U/L (ref 39–117)
Bilirubin, Direct: 0.1 mg/dL (ref 0.0–0.3)
Indirect Bilirubin: 0.3 mg/dL (ref 0.2–1.2)
Total Bilirubin: 0.4 mg/dL (ref 0.2–1.2)
Total Protein: 6.7 g/dL (ref 6.0–8.3)

## 2013-09-13 LAB — LIPID PANEL
Cholesterol: 182 mg/dL (ref 0–200)
HDL: 51 mg/dL (ref >39)
LDL Cholesterol: 101 mg/dL — ABNORMAL HIGH (ref 0–99)
Total CHOL/HDL Ratio: 3.6 Ratio
Triglycerides: 148 mg/dL (ref <150)
VLDL: 30 mg/dL (ref 0–40)

## 2013-09-13 LAB — BASIC METABOLIC PANEL WITH GFR
BUN: 20 mg/dL (ref 6–23)
CO2: 28 mEq/L (ref 19–32)
Calcium: 9 mg/dL (ref 8.4–10.5)
Chloride: 107 mEq/L (ref 96–112)
Creat: 0.68 mg/dL (ref 0.50–1.10)
GFR, Est African American: >89 mL/min
GFR, Est Non African American: >89 mL/min
Glucose, Bld: 86 mg/dL (ref 70–99)
Potassium: 4 mEq/L (ref 3.5–5.3)
Sodium: 140 mEq/L (ref 135–145)

## 2013-09-13 MED ORDER — LORAZEPAM 0.5 MG PO TABS: ORAL_TABLET | ORAL | Status: DC

## 2013-09-13 NOTE — Progress Notes (Signed)
Subjective:    Patient ID: Margaret Gilmore, female    DOB: Oct 08, 1955, 58 y.o.   MRN: [id number]  HPI Comments: 58 yo WF presents for 3 month F/U for Cholesterol, Pre-Dm. She has increase stress with decreased money with decreased work hours. She has had more headaches with increased stress and sleep disturbance with stress. She is not exercising due to work schedule. She is eating healthy but skips meals.  Last labs  CHOL         186   05/31/2013 HDL           64   05/31/2013 LDLCALC      109   05/31/2013 TRIG          67   05/31/2013 CHOLHDL      2.9   05/31/2013 ALT           14   05/31/2013 AST           14   05/31/2013 ALKPHOS       72   05/31/2013 BILITOT      0.4   05/31/2013 CREATININE     0.60   05/31/2013 BUN              14   05/31/2013 NA              142   05/31/2013 K               4.2   05/31/2013 CL              102   05/31/2013 CO2              28   05/31/2013 WBC      6.3   05/31/2013 HGB     12.9   05/31/2013 HCT     37.9   05/31/2013 MCV     88.3   05/31/2013 PLT      327   05/31/2013 A1c 5.8     Medication List       This list is accurate as of: 09/13/13  4:43 PM.  Always use your most recent med list.               GOODY HEADACHE PO  Take by mouth.     LORazepam 0.5 MG tablet  Commonly known as:  ATIVAN  TAKE 1 TABLET BY MOUTH TWICE A DAY prn     omeprazole 40 MG capsule  Commonly known as:  PRILOSEC  Take 1 capsule (40 mg total) by mouth daily. Take this medication in the A.M.     Vitamin D 2000 UNITS tablet  Take 2,000 Units by mouth daily.       Allergies  Allergen Reactions  . Eggs Or Egg-Derived Products     Headaches  . Lyrica [Pregabalin]     swelling  . Sulfa Antibiotics     Headache  . Zoloft [Sertraline Hcl]     headache    Past Medical History  Diagnosis Date  . Depression   . Unspecified vitamin D deficiency   . Anxiety   . B12 deficiency   . Trigeminal neuralgia       Review of Systems  Constitutional: Positive for fatigue.   Neurological: Positive for headaches.  Psychiatric/Behavioral: Positive for sleep disturbance. The patient is nervous/anxious.   All other systems reviewed and are negative.  BP 112/60  Pulse 64  Temp(Src) 98.2 F (36.8 C) (Temporal)  Resp 16  Ht 5\' 5"  (1.651 m)  Wt 181 lb (82.101 kg)  BMI 30.12 kg/m2     Objective:   Physical Exam  Nursing note and vitals reviewed. Constitutional: She is oriented to person, place, and time. She appears well-developed and well-nourished. No distress.  HENT:  Head: Normocephalic and atraumatic.  Right Ear: External ear normal.  Left Ear: External ear normal.  Nose: Nose normal.  Mouth/Throat: Oropharynx is clear and moist.  Eyes: Conjunctivae and EOM are normal. Pupils are equal, round, and reactive to light.  Neck: Normal range of motion. Neck supple. No JVD present. No thyromegaly present.  Cardiovascular: Normal rate, regular rhythm, normal heart sounds and intact distal pulses.   Pulmonary/Chest: Effort normal and breath sounds normal.  Abdominal: Soft. Bowel sounds are normal. She exhibits no distension. There is no tenderness. There is no guarding.  Musculoskeletal: Normal range of motion. She exhibits no edema and no tenderness.  Lymphadenopathy:    She has no cervical adenopathy.  Neurological: She is alert and oriented to person, place, and time. No cranial nerve deficit.  Skin: Skin is warm and dry. No rash noted. No erythema. No pallor.  Psychiatric: She has a normal mood and affect. Her behavior is normal. Judgment and thought content normal.          Assessment & Plan:  1. Depression/ Anxiety- ? Stress/ tension induced HA vs hypoglycemia-Take Lorazepam 1/2 QD- BID call with results, counseling/ decrease stress advised. w/c if SX increase or ER. Eat Q 2 hours  2. Pre-DM/ Cholesterol- recheck labs, Need to eat healthier and exercise AD.

## 2013-09-13 NOTE — Patient Instructions (Signed)
   Bad carbs also include fruit juice, alcohol, and sweet tea. These are empty calories that do not signal to your brain that you are full.   Please remember the good carbs are still carbs which convert into sugar. So please measure them out no more than 1/2-1 cup of rice, oatmeal, pasta, and beans.  Veggies are however free foods! Pile them on.   I like lean protein at every meal such as chicken, turkey, pork chops, cottage cheese, etc. Just do not fry these meats and please center your meal around vegetable, the meats should be a side dish.   No all fruit is created equal. Please see the list below, the fruit at the bottom is higher in sugars than the fruit at the top   We want weight loss that will last so you should lose 1-2 pounds a week.  THAT IS IT! Please pick THREE things a month to change. Once it is a habit check off the item. Then pick another three items off the list to become habits.  If you are already doing a habit on the list GREAT!  Cross that item off! o Don't drink your calories. Ie, alcohol, soda, fruit juice, and sweet tea.  o Drink more water. Drink a glass when you feel hungry or before each meal.  o Eat breakfast - Complex carb and protein (likeDannon light and fit yogurt, oatmeal, fruit, eggs, turkey bacon). o Measure your cereal.  Eat no more than one cup a day. (ie Kashi) o Eat an apple a day. o Add a vegetable a day. o Try a new vegetable a month. o Use Pam! Stop using oil or butter to cook. o Don't finish your plate or use smaller plates. o Share your dessert. o Eat sugar free Jello for dessert or frozen grapes. o Don't eat 2-3 hours before bed. o Switch to whole wheat bread, pasta, and brown rice. o Make healthier choices when you eat out. No fries! o Pick baked chicken, NOT fried. o Don't forget to SLOW DOWN when you eat. It is not going anywhere.  o Take the stairs. o Park far away in the parking lot o Lift soup cans (or weights) for 10 minutes while  watching TV. o Walk at work for 10 minutes during break. o Walk outside 1 time a week with your friend, kids, dog, or significant other. o Start a walking group at church. o Walk the mall as much as you can tolerate.  o Keep a food diary. o Weigh yourself daily. o Walk for 15 minutes 3 days per week. o Cook at home more often and eat out less.  If life happens and you go back to old habits, it is okay.  Just start over. You can do it!   If you experience chest pain, get short of breath, or tired during the exercise, please stop immediately and inform your doctor.   

## 2013-09-14 LAB — HEMOGLOBIN A1C
Hgb A1c MFr Bld: 5.8 % — ABNORMAL HIGH (ref <5.7)
Mean Plasma Glucose: 120 mg/dL — ABNORMAL HIGH (ref <117)

## 2013-09-19 ENCOUNTER — Encounter: Payer: Self-pay | Admitting: *Deleted

## 2014-01-18 ENCOUNTER — Ambulatory Visit: Payer: Self-pay | Admitting: Emergency Medicine

## 2014-01-23 ENCOUNTER — Ambulatory Visit (INDEPENDENT_AMBULATORY_CARE_PROVIDER_SITE_OTHER): Payer: 59 | Admitting: Physician Assistant

## 2014-01-23 ENCOUNTER — Encounter: Payer: Self-pay | Admitting: Physician Assistant

## 2014-01-23 ENCOUNTER — Ambulatory Visit: Payer: Self-pay | Admitting: Emergency Medicine

## 2014-01-23 VITALS — BP 102/62 | HR 72 | Temp 98.1°F | Resp 16 | Ht 65.0 in | Wt 168.0 lb

## 2014-01-23 DIAGNOSIS — R7309 Other abnormal glucose: Secondary | ICD-10-CM | POA: Insufficient documentation

## 2014-01-23 DIAGNOSIS — E785 Hyperlipidemia, unspecified: Secondary | ICD-10-CM

## 2014-01-23 DIAGNOSIS — Z79899 Other long term (current) drug therapy: Secondary | ICD-10-CM

## 2014-01-23 DIAGNOSIS — R7303 Prediabetes: Secondary | ICD-10-CM

## 2014-01-23 DIAGNOSIS — E559 Vitamin D deficiency, unspecified: Secondary | ICD-10-CM

## 2014-01-23 NOTE — Patient Instructions (Signed)

## 2014-01-23 NOTE — Progress Notes (Signed)
Assessment and Plan:  Hypertension: Continue medication, monitor blood pressure at home. Continue DASH diet. Cholesterol: Continue diet and exercise. Check cholesterol.  Pre-diabetes-Continue diet and exercise. Check A1C Vitamin D Def- check level and continue medications.  Migraines/TN- does not want med at this time if needs we will send in tomapax.   Continue diet and meds as discussed. Further disposition pending results of labs.  HPI 58 y.o. female  presents for 3 month follow up with hypertension, hyperlipidemia, prediabetes and vitamin D. Her blood pressure has been controlled at home, today their BP is BP: 102/62 mmHg She does not workout. She denies chest pain, shortness of breath, dizziness.  She is not on cholesterol medication and denies myalgias. Her cholesterol is at goal. The cholesterol last visit was:   Lab Results  Component Value Date   CHOL 182 09/13/2013   HDL 51 09/13/2013   LDLCALC 101* 09/13/2013   TRIG 148 09/13/2013   CHOLHDL 3.6 09/13/2013   She has been working on diet and exercise for prediabetes, and denies paresthesia of the feet, polydipsia and polyuria. Last A1C in the office was:  Lab Results  Component Value Date   HGBA1C 5.8* 09/13/2013   Patient is on Vitamin D supplement.   Lab Results  Component Value Date   VD25OH 50 05/31/2013     She started isogenics program with her daughter, and has lost a total of 13 lbs.  She has been stressed still but not taking any of the lorazepam that was given last visit.  She continues to have headaches but they have gotten less, has been on topamax in the past and did well.   Current Medications:  No current outpatient prescriptions on file prior to visit.   No current facility-administered medications on file prior to visit.   Medical History:  Past Medical History  Diagnosis Date  . Depression   . Unspecified vitamin D deficiency   . Anxiety   . B12 deficiency   . Trigeminal neuralgia    Allergies:   Allergies  Allergen Reactions  . Eggs Or Egg-Derived Products     Headaches  . Lyrica [Pregabalin]     swelling  . Sulfa Antibiotics     Headache  . Zoloft [Sertraline Hcl]     headache     Review of Systems: [X]  = complains of  [ ]  = denies  General: Fatigue [ ]  Fever [ ]  Chills [ ]  Weakness [ ]   Insomnia [ ]  Eyes: Redness [ ]  Blurred vision [ ]  Diplopia [ ]   ENT: Congestion [ ]  Sinus Pain [ ]  Post Nasal Drip [ ]  Sore Throat [ ]  Earache [ ]   Cardiac: Chest pain/pressure [ ]  SOB [ ]  Orthopnea [ ]   Palpitations [ ]   Paroxysmal nocturnal dyspnea[ ]  Claudication [ ]  Edema [ ]   Pulmonary: Cough [ ]  Wheezing[ ]   SOB [ ]   Snoring [ ]   GI: Nausea [ ]  Vomiting[ ]  Dysphagia[ ]  Heartburn[ ]  Abdominal pain [ ]  Constipation [ ] ; Diarrhea [ ] ; BRBPR [ ]  Melena[ ]  GU: Hematuria[ ]  Dysuria [ ]  Nocturia[ ]  Urgency [ ]   Hesitancy [ ]  Discharge [ ]  Neuro: Headaches[ ]  Vertigo[ ]  Paresthesias[ ]  Spasm [ ]  Speech changes [ ]  Incoordination [ ]   Ortho: Arthritis [ ]  Joint pain [ ]  Muscle pain [ ]  Joint swelling [ ]  Back Pain [ ]  Skin:  Rash [ ]   Pruritis [ ]  Change in skin lesion [ ]   Psych:  Depression[ ]  Anxiety[ ]  Confusion [ ]  Memory loss [ ]   Heme/Lypmh: Bleeding [ ]  Bruising [ ]  Enlarged lymph nodes [ ]   Endocrine: Visual blurring [ ]  Paresthesia [ ]  Polyuria [ ]  Polydypsea [ ]    Heat/cold intolerance [ ]  Hypoglycemia [ ]   Family history- Review and unchanged Social history- Review and unchanged Physical Exam: BP 102/62  Pulse 72  Temp(Src) 98.1 F (36.7 C)  Resp 16  Ht 5\' 5"  (1.651 m)  Wt 168 lb (76.204 kg)  BMI 27.96 kg/m2 Wt Readings from Last 3 Encounters:  01/23/14 168 lb (76.204 kg)  09/13/13 181 lb (82.101 kg)  08/25/13 181 lb (82.101 kg)   General Appearance: Well nourished, in no apparent distress. Eyes: PERRLA, EOMs, conjunctiva no swelling or erythema Sinuses: No Frontal/maxillary tenderness ENT/Mouth: Ext aud canals clear, TMs without erythema, bulging. No erythema,  swelling, or exudate on post pharynx.  Tonsils not swollen or erythematous. Hearing normal.  Neck: Supple, thyroid normal.  Respiratory: Respiratory effort normal, BS equal bilaterally without rales, rhonchi, wheezing or stridor.  Cardio: RRR with no MRGs. Brisk peripheral pulses without edema.  Abdomen: Soft, + BS.  Non tender, no guarding, rebound, hernias, masses. Lymphatics: Non tender without lymphadenopathy.  Musculoskeletal: Full ROM, 5/5 strength, normal gait.  Skin: Warm, dry without rashes, lesions, ecchymosis.  Neuro: Cranial nerves intact. Normal muscle tone, no cerebellar symptoms. Sensation intact.  Psych: Awake and oriented X 3, normal affect, Insight and Judgment appropriate.    Vicie Mutters 4:39 PM

## 2014-01-24 LAB — CBC WITH DIFFERENTIAL/PLATELET
Basophils Absolute: 0 10*3/uL (ref 0.0–0.1)
Basophils Relative: 0 % (ref 0–1)
Eosinophils Absolute: 0.1 10*3/uL (ref 0.0–0.7)
Eosinophils Relative: 1 % (ref 0–5)
HCT: 37.7 % (ref 36.0–46.0)
Hemoglobin: 12.7 g/dL (ref 12.0–15.0)
Lymphocytes Relative: 39 % (ref 12–46)
Lymphs Abs: 2.8 10*3/uL (ref 0.7–4.0)
MCH: 29.4 pg (ref 26.0–34.0)
MCHC: 33.7 g/dL (ref 30.0–36.0)
MCV: 87.3 fL (ref 78.0–100.0)
Monocytes Absolute: 0.4 10*3/uL (ref 0.1–1.0)
Monocytes Relative: 5 % (ref 3–12)
Neutro Abs: 4 10*3/uL (ref 1.7–7.7)
Neutrophils Relative %: 55 % (ref 43–77)
Platelets: 331 10*3/uL (ref 150–400)
RBC: 4.32 MIL/uL (ref 3.87–5.11)
RDW: 13.9 % (ref 11.5–15.5)
WBC: 7.3 10*3/uL (ref 4.0–10.5)

## 2014-01-24 LAB — LIPID PANEL
Cholesterol: 143 mg/dL (ref 0–200)
HDL: 52 mg/dL (ref >39)
LDL Cholesterol: 73 mg/dL (ref 0–99)
Total CHOL/HDL Ratio: 2.8 Ratio
Triglycerides: 92 mg/dL (ref <150)
VLDL: 18 mg/dL (ref 0–40)

## 2014-01-24 LAB — BASIC METABOLIC PANEL WITH GFR
BUN: 21 mg/dL (ref 6–23)
CO2: 27 mEq/L (ref 19–32)
Calcium: 9.7 mg/dL (ref 8.4–10.5)
Chloride: 105 mEq/L (ref 96–112)
Creat: 0.76 mg/dL (ref 0.50–1.10)
GFR, Est African American: >89 mL/min
GFR, Est Non African American: 86 mL/min
Glucose, Bld: 103 mg/dL — ABNORMAL HIGH (ref 70–99)
Potassium: 4.3 mEq/L (ref 3.5–5.3)
Sodium: 140 mEq/L (ref 135–145)

## 2014-01-24 LAB — HEMOGLOBIN A1C
Hgb A1c MFr Bld: 5.8 % — ABNORMAL HIGH (ref <5.7)
Mean Plasma Glucose: 120 mg/dL — ABNORMAL HIGH (ref <117)

## 2014-01-24 LAB — HEPATIC FUNCTION PANEL
ALT: 16 U/L (ref 0–35)
AST: 15 U/L (ref 0–37)
Albumin: 4.4 g/dL (ref 3.5–5.2)
Alkaline Phosphatase: 60 U/L (ref 39–117)
Bilirubin, Direct: 0.1 mg/dL (ref 0.0–0.3)
Indirect Bilirubin: 0.3 mg/dL (ref 0.2–1.2)
Total Bilirubin: 0.4 mg/dL (ref 0.2–1.2)
Total Protein: 7.4 g/dL (ref 6.0–8.3)

## 2014-01-24 LAB — VITAMIN D 25 HYDROXY (VIT D DEFICIENCY, FRACTURES): Vit D, 25-Hydroxy: 63 ng/mL (ref 30–89)

## 2014-01-24 LAB — TSH: TSH: 1.518 u[IU]/mL (ref 0.350–4.500)

## 2014-01-24 LAB — MAGNESIUM: Magnesium: 2.2 mg/dL (ref 1.5–2.5)

## 2014-02-07 ENCOUNTER — Ambulatory Visit (INDEPENDENT_AMBULATORY_CARE_PROVIDER_SITE_OTHER): Payer: 59 | Admitting: Ophthalmology

## 2014-02-07 DIAGNOSIS — H43819 Vitreous degeneration, unspecified eye: Secondary | ICD-10-CM

## 2014-02-07 DIAGNOSIS — H2510 Age-related nuclear cataract, unspecified eye: Secondary | ICD-10-CM

## 2014-02-07 DIAGNOSIS — H35349 Macular cyst, hole, or pseudohole, unspecified eye: Secondary | ICD-10-CM

## 2014-03-08 NOTE — Telephone Encounter (Signed)
error 

## 2014-05-31 ENCOUNTER — Encounter: Payer: Self-pay | Admitting: Emergency Medicine

## 2014-06-11 ENCOUNTER — Ambulatory Visit (INDEPENDENT_AMBULATORY_CARE_PROVIDER_SITE_OTHER): Payer: 59 | Admitting: Ophthalmology

## 2014-06-11 DIAGNOSIS — H35343 Macular cyst, hole, or pseudohole, bilateral: Secondary | ICD-10-CM

## 2014-06-11 DIAGNOSIS — H43813 Vitreous degeneration, bilateral: Secondary | ICD-10-CM

## 2014-10-10 ENCOUNTER — Ambulatory Visit (INDEPENDENT_AMBULATORY_CARE_PROVIDER_SITE_OTHER): Payer: 59 | Admitting: Ophthalmology

## 2014-10-10 DIAGNOSIS — H43813 Vitreous degeneration, bilateral: Secondary | ICD-10-CM | POA: Diagnosis not present

## 2014-10-10 DIAGNOSIS — H2513 Age-related nuclear cataract, bilateral: Secondary | ICD-10-CM | POA: Diagnosis not present

## 2014-10-10 DIAGNOSIS — H35342 Macular cyst, hole, or pseudohole, left eye: Secondary | ICD-10-CM

## 2014-10-22 ENCOUNTER — Ambulatory Visit: Payer: Self-pay

## 2014-10-22 ENCOUNTER — Other Ambulatory Visit: Payer: Self-pay | Admitting: Occupational Medicine

## 2014-10-22 DIAGNOSIS — M79671 Pain in right foot: Secondary | ICD-10-CM

## 2015-01-30 ENCOUNTER — Telehealth: Payer: Self-pay | Admitting: Physician Assistant

## 2015-01-30 MED ORDER — PREDNISONE 20 MG PO TABS: ORAL_TABLET | ORAL | Status: DC

## 2015-01-30 MED ORDER — FLUTICASONE PROPIONATE 50 MCG/ACT NA SUSP: 2.0000 | Freq: Every day | NASAL | Status: DC

## 2015-01-30 NOTE — Telephone Encounter (Signed)
Patient calling, saying having hoarseness, sore throat, eyes watery x several days, on allegra.  Still likely viral or allergies- will send in flonase and prednisone, if not better call the office.

## 2015-01-30 NOTE — Telephone Encounter (Signed)
Patient aware.

## 2015-02-11 ENCOUNTER — Ambulatory Visit (INDEPENDENT_AMBULATORY_CARE_PROVIDER_SITE_OTHER): Payer: Managed Care, Other (non HMO) | Admitting: Ophthalmology

## 2015-02-11 DIAGNOSIS — H43813 Vitreous degeneration, bilateral: Secondary | ICD-10-CM | POA: Diagnosis not present

## 2015-02-11 DIAGNOSIS — H35343 Macular cyst, hole, or pseudohole, bilateral: Secondary | ICD-10-CM

## 2015-02-11 DIAGNOSIS — H35342 Macular cyst, hole, or pseudohole, left eye: Secondary | ICD-10-CM | POA: Diagnosis present

## 2015-02-11 NOTE — H&P (Signed)
Margaret Gilmore is an 59 y.o. female.   Chief Complaint: Loss of vision left eye  HPI: Loss of vision left eye over 4 months from [facility]  Past Medical History  Diagnosis Date  . Depression   . Unspecified vitamin D deficiency   . Anxiety   . B12 deficiency   . Trigeminal neuralgia     Past Surgical History  Procedure Laterality Date  . Abdominal hysterectomy    . Cesarean section    . Trigeminal     . Appendectomy      Family History  Problem Relation Age of Onset  . Pancreatic cancer Father   . Hyperlipidemia Father   . Heart disease Father   . Hypertension Father   . Diabetes Mother   . COPD Mother   . Hyperlipidemia Mother   . Hypertension Brother   . Dementia Maternal Grandmother   . Cancer Maternal Grandfather     bone  . Cancer Maternal Aunt     BREAST  . Cancer Paternal Aunt     HODGKINS  . Cancer Paternal Uncle     BONE  . Colon cancer Paternal Uncle    Social History:  reports that she has never smoked. She has never used smokeless tobacco. She reports that she does not drink alcohol or use illicit drugs.  Allergies:  Allergies  Allergen Reactions  . Eggs Or Egg-Derived Products     Headaches  . Lyrica [Pregabalin]     swelling  . Sulfa Antibiotics     Headache  . Zoloft [Sertraline Hcl]     headache    No prescriptions prior to admission    Review of systems otherwise negative  There were no vitals taken for this visit.  Physical exam: Mental status: oriented x3. Eyes: See eye exam associated with this date of surgery in media tab.  Scanned in by scanning center Ears, Nose, Throat: within normal limits Neck: Within Normal limits General: within normal limits Chest: Within normal limits Breast: deferred Heart: Within normal limits Abdomen: Within normal limits GU: deferred Extremities: within normal limits Skin: within normal limits  Assessment/Plan Macular hole left eye Plan: To [facility] for Pars plana vitrectomy,  membrane peel, laser, serum patch, gas injection left eye  MATTHEWS, JOHN D 02/11/2015, 5:05 PM

## 2015-03-04 ENCOUNTER — Encounter (HOSPITAL_COMMUNITY): Payer: Self-pay | Admitting: *Deleted

## 2015-03-04 NOTE — Progress Notes (Signed)
Pt denies cardiac history, chest pain or sob. Pt was instructed by Dr. Zigmund Daniel to be here tomorrow at 9:00 AM and not to take any medications in the AM.

## 2015-03-05 ENCOUNTER — Ambulatory Visit (HOSPITAL_COMMUNITY): Payer: Managed Care, Other (non HMO) | Admitting: Certified Registered Nurse Anesthetist

## 2015-03-05 ENCOUNTER — Encounter (HOSPITAL_COMMUNITY)
Admission: RE | Disposition: A | Discharge status: Home / Self Care | Payer: Self-pay | Source: Ambulatory Visit | Attending: Ophthalmology

## 2015-03-05 ENCOUNTER — Encounter (HOSPITAL_COMMUNITY): Payer: Self-pay | Admitting: *Deleted

## 2015-03-05 ENCOUNTER — Ambulatory Visit (HOSPITAL_COMMUNITY)
Admission: RE | Admit: 2015-03-05 | Discharge: 2015-03-06 | Disposition: A | Discharge status: Home / Self Care | Payer: Managed Care, Other (non HMO) | Source: Ambulatory Visit | Attending: Ophthalmology | Admitting: Ophthalmology

## 2015-03-05 DIAGNOSIS — H35342 Macular cyst, hole, or pseudohole, left eye: Secondary | ICD-10-CM | POA: Insufficient documentation

## 2015-03-05 HISTORY — PX: MEMBRANE PEEL: SHX5967

## 2015-03-05 HISTORY — PX: 25 GAUGE PARS PLANA VITRECTOMY WITH 20 GAUGE MVR PORT FOR MACULAR HOLE: SHX6096

## 2015-03-05 HISTORY — DX: Family history of other specified conditions: Z84.89

## 2015-03-05 HISTORY — DX: Unspecified cataract: H26.9

## 2015-03-05 HISTORY — DX: Constipation, unspecified: K59.00

## 2015-03-05 HISTORY — DX: Unspecified osteoarthritis, unspecified site: M19.90

## 2015-03-05 HISTORY — DX: Macular cyst, hole, or pseudohole, left eye: H35.342

## 2015-03-05 HISTORY — PX: SERUM PATCH: SHX6091

## 2015-03-05 HISTORY — DX: Headache, unspecified: R51.9

## 2015-03-05 HISTORY — DX: Headache: R51

## 2015-03-05 HISTORY — PX: GAS/FLUID EXCHANGE: SHX5334

## 2015-03-05 HISTORY — PX: LASER PHOTO ABLATION: SHX5942

## 2015-03-05 HISTORY — DX: Gastro-esophageal reflux disease without esophagitis: K21.9

## 2015-03-05 LAB — CBC
HCT: 38.3 % (ref 36.0–46.0)
Hemoglobin: 12.4 g/dL (ref 12.0–15.0)
MCH: 29.5 pg (ref 26.0–34.0)
MCHC: 32.4 g/dL (ref 30.0–36.0)
MCV: 91.2 fL (ref 78.0–100.0)
Platelets: 300 10*3/uL (ref 150–400)
RBC: 4.2 MIL/uL (ref 3.87–5.11)
RDW: 13.3 % (ref 11.5–15.5)
WBC: 6.2 10*3/uL (ref 4.0–10.5)

## 2015-03-05 LAB — AUTOLOGOUS SERUM PATCH PREP

## 2015-03-05 SURGERY — 25 GAUGE PARS PLANA VITRECTOMY WITH 20 GAUGE MVR PORT FOR MACULAR HOLE
Anesthesia: General | Site: Eye | Laterality: Left

## 2015-03-05 MED ORDER — SODIUM CHLORIDE 0.9 % IJ SOLN
INTRAMUSCULAR | Status: AC
  Filled 2015-03-05: qty 10

## 2015-03-05 MED ORDER — HYDROMORPHONE HCL 1 MG/ML IJ SOLN
INTRAMUSCULAR | Status: AC
  Administered 2015-03-05: 0.25 mg via INTRAVENOUS
  Filled 2015-03-05: qty 1

## 2015-03-05 MED ORDER — SODIUM HYALURONATE 10 MG/ML IO SOLN
INTRAOCULAR | Status: DC | PRN
  Administered 2015-03-05: 0.85 mL via INTRAOCULAR

## 2015-03-05 MED ORDER — ACETAZOLAMIDE SODIUM 500 MG IJ SOLR
500.0000 mg | Freq: Once | INTRAMUSCULAR | Status: AC
  Administered 2015-03-06: 500 mg via INTRAVENOUS
  Filled 2015-03-05: qty 500

## 2015-03-05 MED ORDER — DEXAMETHASONE SODIUM PHOSPHATE 10 MG/ML IJ SOLN
INTRAMUSCULAR | Status: DC | PRN
  Administered 2015-03-05: 10 mg via INTRAVENOUS

## 2015-03-05 MED ORDER — PHENYLEPHRINE HCL 10 % OP SOLN
OPHTHALMIC | Status: AC
  Filled 2015-03-05: qty 5

## 2015-03-05 MED ORDER — LATANOPROST 0.005 % OP SOLN
1.0000 [drp] | Freq: Every day | OPHTHALMIC | Status: DC
  Filled 2015-03-05: qty 2.5

## 2015-03-05 MED ORDER — HYDROCODONE-ACETAMINOPHEN 5-325 MG PO TABS: 1.0000 | ORAL_TABLET | ORAL | Status: DC | PRN

## 2015-03-05 MED ORDER — ONDANSETRON HCL 4 MG/2ML IJ SOLN
INTRAMUSCULAR | Status: DC | PRN
  Administered 2015-03-05: 4 mg via INTRAVENOUS

## 2015-03-05 MED ORDER — FENTANYL CITRATE (PF) 250 MCG/5ML IJ SOLN
INTRAMUSCULAR | Status: AC
  Filled 2015-03-05: qty 5

## 2015-03-05 MED ORDER — SODIUM CHLORIDE 0.9 % IV SOLN
INTRAVENOUS | Status: DC
  Administered 2015-03-05 (×2): via INTRAVENOUS

## 2015-03-05 MED ORDER — HYDROMORPHONE HCL 1 MG/ML IJ SOLN
0.2500 mg | INTRAMUSCULAR | Status: DC | PRN
  Administered 2015-03-05 (×3): 0.25 mg via INTRAVENOUS

## 2015-03-05 MED ORDER — GLYCOPYRROLATE 0.2 MG/ML IJ SOLN
INTRAMUSCULAR | Status: DC | PRN
  Administered 2015-03-05: 0.4 mg via INTRAVENOUS

## 2015-03-05 MED ORDER — LIDOCAINE HCL 4 % MT SOLN
OROMUCOSAL | Status: DC | PRN
  Administered 2015-03-05: 10 mL via TOPICAL

## 2015-03-05 MED ORDER — TETRACAINE HCL 0.5 % OP SOLN
2.0000 [drp] | Freq: Once | OPHTHALMIC | Status: DC
  Filled 2015-03-05: qty 2

## 2015-03-05 MED ORDER — CEFAZOLIN SODIUM-DEXTROSE 2-3 GM-% IV SOLR: 2.0000 g | INTRAVENOUS | Status: DC

## 2015-03-05 MED ORDER — BACITRACIN-POLYMYXIN B 500-10000 UNIT/GM OP OINT
1.0000 "application " | TOPICAL_OINTMENT | Freq: Three times a day (TID) | OPHTHALMIC | Status: DC
  Filled 2015-03-05: qty 3.5

## 2015-03-05 MED ORDER — ACETAMINOPHEN 325 MG PO TABS
325.0000 mg | ORAL_TABLET | ORAL | Status: DC | PRN
  Administered 2015-03-05 – 2015-03-06 (×2): 650 mg via ORAL
  Filled 2015-03-05 (×2): qty 2

## 2015-03-05 MED ORDER — ROCURONIUM BROMIDE 100 MG/10ML IV SOLN
INTRAVENOUS | Status: DC | PRN
  Administered 2015-03-05: 30 mg via INTRAVENOUS

## 2015-03-05 MED ORDER — TEMAZEPAM 15 MG PO CAPS: 15.0000 mg | ORAL_CAPSULE | Freq: Every evening | ORAL | Status: DC | PRN

## 2015-03-05 MED ORDER — HYALURONIDASE HUMAN 150 UNIT/ML IJ SOLN
INTRAMUSCULAR | Status: AC
  Filled 2015-03-05: qty 1

## 2015-03-05 MED ORDER — GATIFLOXACIN 0.5 % OP SOLN
1.0000 [drp] | OPHTHALMIC | Status: AC
  Administered 2015-03-05 (×3): 1 [drp] via OPHTHALMIC

## 2015-03-05 MED ORDER — CEFAZOLIN SODIUM-DEXTROSE 2-3 GM-% IV SOLR
INTRAVENOUS | Status: AC
  Administered 2015-03-05: 2 g via INTRAVENOUS
  Filled 2015-03-05: qty 50

## 2015-03-05 MED ORDER — EPINEPHRINE HCL 1 MG/ML IJ SOLN
INTRAOCULAR | Status: DC | PRN
  Administered 2015-03-05: 12:00:00

## 2015-03-05 MED ORDER — LIDOCAINE HCL (CARDIAC) 20 MG/ML IV SOLN
INTRAVENOUS | Status: DC | PRN
  Administered 2015-03-05: 100 mg via INTRAVENOUS

## 2015-03-05 MED ORDER — DEXAMETHASONE SODIUM PHOSPHATE 10 MG/ML IJ SOLN
INTRAMUSCULAR | Status: AC
  Filled 2015-03-05: qty 1

## 2015-03-05 MED ORDER — MAGNESIUM HYDROXIDE 400 MG/5ML PO SUSP: 15.0000 mL | Freq: Four times a day (QID) | ORAL | Status: DC | PRN

## 2015-03-05 MED ORDER — BACITRACIN-POLYMYXIN B 500-10000 UNIT/GM OP OINT
TOPICAL_OINTMENT | OPHTHALMIC | Status: AC
  Filled 2015-03-05: qty 3.5

## 2015-03-05 MED ORDER — PHENYLEPHRINE HCL 10 % OP SOLN
1.0000 [drp] | Freq: Once | OPHTHALMIC | Status: AC
  Administered 2015-03-05: 1 [drp] via OPHTHALMIC

## 2015-03-05 MED ORDER — BUPIVACAINE HCL (PF) 0.75 % IJ SOLN
INTRAMUSCULAR | Status: AC
  Filled 2015-03-05: qty 10

## 2015-03-05 MED ORDER — CYCLOPENTOLATE HCL 1 % OP SOLN
OPHTHALMIC | Status: AC
  Filled 2015-03-05: qty 2

## 2015-03-05 MED ORDER — PHENYLEPHRINE HCL 2.5 % OP SOLN
1.0000 [drp] | OPHTHALMIC | Status: AC
  Administered 2015-03-05 (×3): 1 [drp] via OPHTHALMIC

## 2015-03-05 MED ORDER — PREDNISOLONE ACETATE 1 % OP SUSP
1.0000 [drp] | Freq: Four times a day (QID) | OPHTHALMIC | Status: DC
  Filled 2015-03-05: qty 5

## 2015-03-05 MED ORDER — EPHEDRINE SULFATE 50 MG/ML IJ SOLN
INTRAMUSCULAR | Status: DC | PRN
  Administered 2015-03-05 (×6): 5 mg via INTRAVENOUS

## 2015-03-05 MED ORDER — FENTANYL CITRATE (PF) 100 MCG/2ML IJ SOLN
INTRAMUSCULAR | Status: DC | PRN
  Administered 2015-03-05: 75 ug via INTRAVENOUS

## 2015-03-05 MED ORDER — BRIMONIDINE TARTRATE 0.2 % OP SOLN
1.0000 [drp] | Freq: Two times a day (BID) | OPHTHALMIC | Status: DC
  Filled 2015-03-05: qty 5

## 2015-03-05 MED ORDER — BSS IO SOLN
INTRAOCULAR | Status: AC
  Filled 2015-03-05: qty 15

## 2015-03-05 MED ORDER — POLYMYXIN B SULFATE 500000 UNITS IJ SOLR
INTRAMUSCULAR | Status: DC | PRN
  Administered 2015-03-05: 12:00:00

## 2015-03-05 MED ORDER — GATIFLOXACIN 0.5 % OP SOLN
1.0000 [drp] | Freq: Four times a day (QID) | OPHTHALMIC | Status: DC
  Filled 2015-03-05: qty 2.5

## 2015-03-05 MED ORDER — GATIFLOXACIN 0.5 % OP SOLN
OPHTHALMIC | Status: AC
  Filled 2015-03-05: qty 2.5

## 2015-03-05 MED ORDER — SODIUM CHLORIDE 0.45 % IV SOLN
INTRAVENOUS | Status: DC
  Administered 2015-03-05: 20:00:00 via INTRAVENOUS

## 2015-03-05 MED ORDER — TROPICAMIDE 1 % OP SOLN
1.0000 [drp] | OPHTHALMIC | Status: AC
  Administered 2015-03-05 (×3): 1 [drp] via OPHTHALMIC

## 2015-03-05 MED ORDER — TROPICAMIDE 1 % OP SOLN
OPHTHALMIC | Status: AC
  Filled 2015-03-05: qty 3

## 2015-03-05 MED ORDER — ONDANSETRON HCL 4 MG/2ML IJ SOLN: 4.0000 mg | Freq: Once | INTRAMUSCULAR | Status: DC | PRN

## 2015-03-05 MED ORDER — SODIUM HYALURONATE 10 MG/ML IO SOLN
INTRAOCULAR | Status: AC
  Filled 2015-03-05: qty 0.85

## 2015-03-05 MED ORDER — CYCLOPENTOLATE HCL 1 % OP SOLN
1.0000 [drp] | OPHTHALMIC | Status: AC
  Administered 2015-03-05 (×3): 1 [drp] via OPHTHALMIC

## 2015-03-05 MED ORDER — PHENYLEPHRINE HCL 10 MG/ML IJ SOLN
INTRAMUSCULAR | Status: DC | PRN
  Administered 2015-03-05: 40 ug via INTRAVENOUS
  Administered 2015-03-05 (×2): 80 ug via INTRAVENOUS
  Administered 2015-03-05 (×3): 40 ug via INTRAVENOUS

## 2015-03-05 MED ORDER — LIDOCAINE HCL 2 % IJ SOLN
INTRAMUSCULAR | Status: AC
  Filled 2015-03-05: qty 20

## 2015-03-05 MED ORDER — MORPHINE SULFATE (PF) 2 MG/ML IV SOLN: 1.0000 mg | INTRAVENOUS | Status: DC | PRN

## 2015-03-05 MED ORDER — PHENYLEPHRINE HCL 2.5 % OP SOLN
OPHTHALMIC | Status: AC
  Filled 2015-03-05: qty 2

## 2015-03-05 MED ORDER — BSS IO SOLN
INTRAOCULAR | Status: DC | PRN
  Administered 2015-03-05: 15 mL via INTRAOCULAR

## 2015-03-05 MED ORDER — TRIAMCINOLONE ACETONIDE 40 MG/ML IJ SUSP
INTRAMUSCULAR | Status: AC
  Filled 2015-03-05: qty 5

## 2015-03-05 MED ORDER — PROPOFOL 10 MG/ML IV BOLUS
INTRAVENOUS | Status: AC
  Filled 2015-03-05: qty 20

## 2015-03-05 MED ORDER — EPINEPHRINE HCL 1 MG/ML IJ SOLN
INTRAMUSCULAR | Status: AC
  Filled 2015-03-05: qty 1

## 2015-03-05 MED ORDER — NEOSTIGMINE METHYLSULFATE 10 MG/10ML IV SOLN
INTRAVENOUS | Status: DC | PRN
  Administered 2015-03-05: 3 mg via INTRAVENOUS

## 2015-03-05 MED ORDER — PROPOFOL 10 MG/ML IV BOLUS
INTRAVENOUS | Status: DC | PRN
  Administered 2015-03-05: 200 mg via INTRAVENOUS

## 2015-03-05 MED ORDER — HYPROMELLOSE (GONIOSCOPIC) 2.5 % OP SOLN
OPHTHALMIC | Status: AC
  Filled 2015-03-05: qty 15

## 2015-03-05 MED ORDER — POLYMYXIN B SULFATE 500000 UNITS IJ SOLR
INTRAMUSCULAR | Status: AC
  Filled 2015-03-05: qty 1

## 2015-03-05 MED ORDER — BUPIVACAINE HCL (PF) 0.75 % IJ SOLN
INTRAMUSCULAR | Status: DC | PRN
  Administered 2015-03-05: 20 mL

## 2015-03-05 MED ORDER — ONDANSETRON HCL 4 MG/2ML IJ SOLN
4.0000 mg | Freq: Four times a day (QID) | INTRAMUSCULAR | Status: DC
  Administered 2015-03-05 – 2015-03-06 (×3): 4 mg via INTRAVENOUS
  Filled 2015-03-05 (×3): qty 2

## 2015-03-05 MED ORDER — GENTAMICIN SULFATE 40 MG/ML IJ SOLN
INTRAMUSCULAR | Status: AC
  Filled 2015-03-05: qty 2

## 2015-03-05 MED ORDER — BSS PLUS IO SOLN
INTRAOCULAR | Status: AC
  Filled 2015-03-05: qty 500

## 2015-03-05 MED ORDER — BACITRACIN-POLYMYXIN B 500-10000 UNIT/GM OP OINT
TOPICAL_OINTMENT | OPHTHALMIC | Status: DC | PRN
  Administered 2015-03-05: 1 via OPHTHALMIC

## 2015-03-05 MED ORDER — ATROPINE SULFATE 1 % OP SOLN
OPHTHALMIC | Status: AC
  Filled 2015-03-05: qty 5

## 2015-03-05 SURGICAL SUPPLY — 70 items
BALL CTTN LRG ABS STRL LF (GAUZE/BANDAGES/DRESSINGS) ×3
BLADE EYE CATARACT 19 1.4 BEAV (BLADE) IMPLANT
BLADE MVR KNIFE 19G (BLADE) IMPLANT
BLADE MVR KNIFE 20G (BLADE) ×3 IMPLANT
CANNULA VLV SOFT TIP 25G (OPHTHALMIC) ×1 IMPLANT
CANNULA VLV SOFT TIP 25GA (OPHTHALMIC) ×6 IMPLANT
CORDS BIPOLAR (ELECTRODE) ×2 IMPLANT
COTTONBALL LRG STERILE PKG (GAUZE/BANDAGES/DRESSINGS) ×6 IMPLANT
COVER MAYO STAND STRL (DRAPES) IMPLANT
DRAPE INCISE 51X51 W/FILM STRL (DRAPES) ×1 IMPLANT
DRAPE OPHTHALMIC 77X100 STRL (CUSTOM PROCEDURE TRAY) ×2 IMPLANT
ERASER HMR WETFIELD 23G BP (MISCELLANEOUS) ×2 IMPLANT
FILTER BLUE MILLIPORE (MISCELLANEOUS) IMPLANT
FILTER STRAW FLUID ASPIR (MISCELLANEOUS) ×2 IMPLANT
FORCEPS GRIESHABER ILM 25G A (INSTRUMENTS) ×1 IMPLANT
GAS AUTO FILL CONSTEL (OPHTHALMIC) ×2
GAS AUTO FILL CONSTELLATION (OPHTHALMIC) ×1 IMPLANT
GLOVE SS BIOGEL STRL SZ 6.5 (GLOVE) ×1 IMPLANT
GLOVE SS BIOGEL STRL SZ 7 (GLOVE) ×1 IMPLANT
GLOVE SUPERSENSE BIOGEL SZ 6.5 (GLOVE) ×1
GLOVE SUPERSENSE BIOGEL SZ 7 (GLOVE) ×1
GLOVE SURG 8.5 LATEX PF (GLOVE) ×2 IMPLANT
GOWN STRL REUS W/ TWL LRG LVL3 (GOWN DISPOSABLE) ×3 IMPLANT
GOWN STRL REUS W/TWL LRG LVL3 (GOWN DISPOSABLE) ×6
HANDLE PNEUMATIC FOR CONSTEL (OPHTHALMIC) IMPLANT
KIT BASIN OR (CUSTOM PROCEDURE TRAY) ×2 IMPLANT
KNIFE GRIESHABER SHARP 2.5MM (MISCELLANEOUS) IMPLANT
MICROPICK 25G (MISCELLANEOUS)
NDL 18GX1X1/2 (RX/OR ONLY) (NEEDLE) ×1 IMPLANT
NDL 25GX 5/8IN NON SAFETY (NEEDLE) ×1 IMPLANT
NDL FILTER BLUNT 18X1 1/2 (NEEDLE) IMPLANT
NDL HYPO 30X.5 LL (NEEDLE) IMPLANT
NEEDLE 18GX1X1/2 (RX/OR ONLY) (NEEDLE) ×2 IMPLANT
NEEDLE 25GX 5/8IN NON SAFETY (NEEDLE) ×2 IMPLANT
NEEDLE 27GAX1X1/2 (NEEDLE) IMPLANT
NEEDLE BACKFLUSH 1281 A1 (NEEDLE) ×1 IMPLANT
NEEDLE FILTER BLUNT 18X 1/2SAF (NEEDLE)
NEEDLE FILTER BLUNT 18X1 1/2 (NEEDLE) IMPLANT
NEEDLE HYPO 30X.5 LL (NEEDLE) IMPLANT
NS IRRIG 1000ML POUR BTL (IV SOLUTION) ×2 IMPLANT
PACK FRAGMATOME (OPHTHALMIC) IMPLANT
PACK VITRECTOMY CUSTOM (CUSTOM PROCEDURE TRAY) ×2 IMPLANT
PAD ARMBOARD 7.5X6 YLW CONV (MISCELLANEOUS) ×4 IMPLANT
PAK PIK VITRECTOMY CVS 25GA (OPHTHALMIC) ×2 IMPLANT
PIC ILLUMINATED 25G (OPHTHALMIC) ×2
PICK MICROPICK 25G (MISCELLANEOUS) IMPLANT
PIK ILLUMINATED 25G (OPHTHALMIC) ×1 IMPLANT
PROBE LASER ILLUM FLEX CVD 25G (OPHTHALMIC) IMPLANT
REPL STRA BRUSH NDL (NEEDLE) ×1 IMPLANT
REPL STRA BRUSH NEEDLE (NEEDLE) ×4 IMPLANT
RESERVOIR BACK FLUSH (MISCELLANEOUS) ×2 IMPLANT
ROLLS DENTAL (MISCELLANEOUS) ×4 IMPLANT
SCRAPER DIAMOND 25GA (OPHTHALMIC RELATED) IMPLANT
SCRAPER DIAMOND DUST MEMBRANE (MISCELLANEOUS) ×3 IMPLANT
SPEAR EYE SURG WECK-CEL (MISCELLANEOUS) ×1 IMPLANT
SPONGE SURGIFOAM ABS GEL 12-7 (HEMOSTASIS) ×3 IMPLANT
STOPCOCK 4 WAY LG BORE MALE ST (IV SETS) IMPLANT
SUT CHROMIC 7 0 TG140 8 (SUTURE) IMPLANT
SUT ETHILON 9 0 TG140 8 (SUTURE) ×3 IMPLANT
SUT POLY NON ABSORB 10-0 8 STR (SUTURE) IMPLANT
SUT SILK 4 0 RB 1 (SUTURE) IMPLANT
SUT VICRYL 7 0 TG140 8 (SUTURE) ×1 IMPLANT
SYR 20CC LL (SYRINGE) ×2 IMPLANT
SYR 5ML LL (SYRINGE) IMPLANT
SYR BULB 3OZ (MISCELLANEOUS) ×2 IMPLANT
SYR TB 1ML LUER SLIP (SYRINGE) ×2 IMPLANT
SYRINGE 10CC LL (SYRINGE) IMPLANT
TUBING HIGH PRESS EXTEN 6IN (TUBING) IMPLANT
WATER STERILE IRR 1000ML POUR (IV SOLUTION) ×2 IMPLANT
WIPE INSTRUMENT VISIWIPE 73X73 (MISCELLANEOUS) IMPLANT

## 2015-03-05 NOTE — Transfer of Care (Signed)
Immediate Anesthesia Transfer of Care Note  Patient: Margaret Gilmore  Procedure(s) Performed: Procedure(s) with comments: 25 GAUGE PARS PLANA VITRECTOMY WITH 20 GAUGE MVR PORT FOR MACULAR HOLE (Left) MEMBRANE PEEL (Left) SERUM PATCH (Left) LASER PHOTO ABLATION (Left) - Headscope Laser GAS/FLUID EXCHANGE (Left) - CF38  Patient Location: PACU  Anesthesia Type:General  Level of Consciousness: awake, patient cooperative and responds to stimulation  Airway & Oxygen Therapy: Patient Spontanous Breathing and Patient connected to nasal cannula oxygen  Post-op Assessment: Report given to RN, Post -op Vital signs reviewed and stable and Patient moving all extremities X 4  Post vital signs: Reviewed and stable  Last Vitals:  Filed Vitals:   03/05/15 1305  BP:   Pulse:   Temp: 90.1 C    Complications: No apparent anesthesia complications

## 2015-03-05 NOTE — Anesthesia Preprocedure Evaluation (Signed)
Anesthesia Evaluation  Patient identified by MRN, date of birth, ID band Patient awake    Reviewed: Allergy & Precautions, NPO status , Patient's Chart, lab work & pertinent test results, reviewed documented beta blocker date and time   Airway Mallampati: I  TM Distance: >3 FB     Dental   Pulmonary    Pulmonary exam normal        Cardiovascular  Rhythm:Regular Rate:Normal     Neuro/Psych  Headaches, Depression  Neuromuscular disease    GI/Hepatic GERD  ,  Endo/Other    Renal/GU      Musculoskeletal  (+) Arthritis ,   Abdominal   Peds  Hematology   Anesthesia Other Findings   Reproductive/Obstetrics                             Anesthesia Physical Anesthesia Plan  ASA: II  Anesthesia Plan: General   Post-op Pain Management:    Induction: Intravenous  Airway Management Planned: Oral ETT  Additional Equipment:   Intra-op Plan:   Post-operative Plan: Extubation in OR  Informed Consent: I have reviewed the patients History and Physical, chart, labs and discussed the procedure including the risks, benefits and alternatives for the proposed anesthesia with the patient or authorized representative who has indicated his/her understanding and acceptance.     Plan Discussed with: CRNA, Anesthesiologist and Surgeon  Anesthesia Plan Comments:         Anesthesia Quick Evaluation

## 2015-03-05 NOTE — H&P (Signed)
I examined the patient today and there is no change in the medical status 

## 2015-03-05 NOTE — Brief Op Note (Signed)
Brief Operative note   Preoperative diagnosis:  macular hole left eye Postoperative diagnosis  * No Diagnosis Codes entered *  Procedures: repair of macular hole with pars plana vitrectomy, laser, serum patch, ILM peeling, gas fluid exchange left eye  Surgeon:  [name], MD...  Assistant:  Deatra Ina SA   Anesthesia: General  Specimen: none  Estimated blood loss:  1cc  Complications: none  Patient sent to PACU in good condition  Composed by [name] MD  Dictation number: [id number]

## 2015-03-05 NOTE — Anesthesia Postprocedure Evaluation (Signed)
  Anesthesia Post-op Note  Patient: Margaret Gilmore  Procedure(s) Performed: Procedure(s) with comments: 25 GAUGE PARS PLANA VITRECTOMY WITH 20 GAUGE MVR PORT FOR MACULAR HOLE (Left) MEMBRANE PEEL (Left) SERUM PATCH (Left) LASER PHOTO ABLATION (Left) - Headscope Laser GAS/FLUID EXCHANGE (Left) - CF38  Patient Location: PACU  Anesthesia Type:General  Level of Consciousness: awake, alert , oriented and patient cooperative  Airway and Oxygen Therapy: Patient Spontanous Breathing  Post-op Pain: mild  Post-op Assessment: Post-op Vital signs reviewed, Patient's Cardiovascular Status Stable, Respiratory Function Stable, Patent Airway and No signs of Nausea or vomiting              Post-op Vital Signs: stable  Last Vitals:  Filed Vitals:   03/05/15 1350  BP: 115/55  Pulse: 69  Temp:   Resp: 12    Complications: No apparent anesthesia complications

## 2015-03-05 NOTE — Op Note (Signed)
Margaret Gilmore, Margaret Gilmore NO.:  [id number]  MEDICAL RECORD NO.:  [id number]  LOCATION:  6N01C                        FACILITY:  [facility]  PHYSICIAN:  Chrystie Nose. Zigmund Daniel, M.D. DATE OF BIRTH:  1955/11/10  DATE OF PROCEDURE:  03/05/2015 DATE OF DISCHARGE:                              OPERATIVE REPORT   ADMISSION DIAGNOSIS:  Macular hole, left eye.  PROCEDURES:  Pars plana vitrectomy, left eye.  Retinal photocoagulation, left eye.  Membrane peel, left eye.  ILM removal, left eye.  Serum patch, left eye.  Gas fluid exchange, left eye.  SURGEON:  Chrystie Nose. Zigmund Daniel, M.D.  ASSISTANT:  Deatra Ina, SA.  ANESTHESIA:  General.  DETAILS OF PROCEDURE:  After usual prep and drape, the indirect ophthalmoscope laser was moved into place.  A 767 burns were placed in the retinal periphery and weak areas near the ora serrata.  The power was 400 mW, 1000 microns each in 0.1 seconds each.  Attention was then carried to the pars plana area where a three layered 20-gauge MVR incision was made at 1 o'clock.  The 25-gauge trocars were placed at 4 and 10 o'clock.  Contact lens ring anchored into place at 6 and 12 o'clock.  Methylcellulose placed on the corneal surface, and the flat contact lens placed.  Pars plana vitrectomy begun just behind the cataractous lens.  The vitrectomy was carried posteriorly down to the macular surface where the macular hole was seen.  There was wrinkling of this retinal surface around the macular hole.  The core vitrectomy was completed, and the silicone tip suction line was drawn down to the macular region, and the fish strike sign occurred.  Posterior hyaloid was lifted from its attachments to the macular region.  The attention was carried to the mid periphery and far periphery with a 30-degree prismatic contact lens.  All vitreous was removed from these areas. Attention was then carried back to the macular region where the magnifying contact lens was placed  onto a layer of methylcellulose and onto the cornea.  The diamond-dusted membrane scraper was used to engage the internal limiting membrane and dragged it from its attachments to the edge of the macular hole.  The hole was freed for 360 degrees. Then, the ILM was peeled out to 1 disc diameter from the central macular hole.  The edges of the macular hole were freed at that point.  An additional vitrectomy was carried out, and a gas fluid exchange was performed.  Additional time was allowed for fluid to track down the walls of the eye and collect in the posterior segment.  During this time, the serum patch was prepared, and C3F8 was prepared into a 14% concentration.  Additional fluid was removed with the silicone tip suction line.  The macular hole was flattened at this point.  Serum patch was delivered and then additional fluid and serum was removed with the silicone tip suction line.  C3F8 14% was exchanged for intravitreal gas.  The scleral wound was close at 1 o'clock with 9-0 nylon suture. The conjunctiva was closed with wet-field cautery.  The 25-gauge trocars were removed.  The wounds were tested and found to be secured.  Polymyxin and gentamicin were irrigated into tenon space.  Marcaine was injected around the globe for postop pain.  Closing pressure was 10 with a Barraquer tonometer.  Polysporin ophthalmic ointment, a patch and shield were placed.  The patient was awakened and taken to Recovery in satisfactory condition.     Chrystie Nose. Zigmund Daniel, M.D.     JDM/MEDQ  D:  03/05/2015  T:  03/05/2015  Job:  [id number]

## 2015-03-05 NOTE — Progress Notes (Signed)
Patient admitted to [facility].  S/P left PPV. Awake, alert and oriented. Sitting in recliner with head down as ordered. Recliner per patient's request. Dressing intact to left eye. Denies pain. Call bell in reach.

## 2015-03-05 NOTE — Anesthesia Procedure Notes (Signed)
Procedure Name: Intubation Date/Time: 03/05/2015 11:48 AM Performed by: Tressia Miners LEFFEW Pre-anesthesia Checklist: Patient identified, Patient being monitored, Timeout performed, Emergency Drugs available and Suction available Patient Re-evaluated:Patient Re-evaluated prior to inductionOxygen Delivery Method: Circle System Utilized Preoxygenation: Pre-oxygenation with 100% oxygen Intubation Type: IV induction Ventilation: Mask ventilation without difficulty Laryngoscope Size: Mac and 3 Grade View: Grade I Tube type: Oral Tube size: 7.0 mm Number of attempts: 1 Airway Equipment and Method: Stylet Placement Confirmation: ETT inserted through vocal cords under direct vision,  positive ETCO2 and breath sounds checked- equal and bilateral Secured at: 21 cm Tube secured with: Tape Dental Injury: Teeth and Oropharynx as per pre-operative assessment

## 2015-03-06 ENCOUNTER — Encounter (HOSPITAL_COMMUNITY): Payer: Self-pay | Admitting: Ophthalmology

## 2015-03-06 DIAGNOSIS — H35342 Macular cyst, hole, or pseudohole, left eye: Secondary | ICD-10-CM | POA: Diagnosis not present

## 2015-03-06 MED ORDER — GATIFLOXACIN 0.5 % OP SOLN: 1.0000 [drp] | Freq: Four times a day (QID) | OPHTHALMIC | Status: DC

## 2015-03-06 MED ORDER — PREDNISOLONE ACETATE 1 % OP SUSP: 1.0000 [drp] | Freq: Four times a day (QID) | OPHTHALMIC | Status: DC

## 2015-03-06 MED ORDER — BACITRACIN-POLYMYXIN B 500-10000 UNIT/GM OP OINT: 1.0000 "application " | TOPICAL_OINTMENT | Freq: Three times a day (TID) | OPHTHALMIC | Status: DC

## 2015-03-06 NOTE — Progress Notes (Signed)
Patient discharged to home with instructions. 

## 2015-03-06 NOTE — Progress Notes (Signed)
03/06/2015, 6:35 AM  Mental Status:  Awake, Alert, Oriented  Anterior segment: Cornea  Clear    Anterior Chamber Clear    Lens:   Cataract  Intra Ocular Pressure 9 mmHg with Tonopen  Vitreous: Clear 95%gas bubble  Retina:  Attached Good laser reaction   Impression: Excellent result Retina attached   Final Diagnosis: Principal Problem:   Macular hole, left eye   Plan: start post operative eye drops.  Discharge to home.  Give post operative instructions  [name] 03/06/2015, 6:35 AM

## 2015-03-06 NOTE — Discharge Summary (Signed)
Discharge summary not needed on [facility] patients per medical records. 

## 2015-03-12 ENCOUNTER — Encounter (INDEPENDENT_AMBULATORY_CARE_PROVIDER_SITE_OTHER): Payer: Managed Care, Other (non HMO) | Admitting: Ophthalmology

## 2015-03-12 DIAGNOSIS — H35342 Macular cyst, hole, or pseudohole, left eye: Secondary | ICD-10-CM

## 2015-04-03 ENCOUNTER — Encounter (INDEPENDENT_AMBULATORY_CARE_PROVIDER_SITE_OTHER): Payer: Managed Care, Other (non HMO) | Admitting: Ophthalmology

## 2015-04-03 DIAGNOSIS — H35342 Macular cyst, hole, or pseudohole, left eye: Secondary | ICD-10-CM

## 2015-06-12 ENCOUNTER — Encounter (INDEPENDENT_AMBULATORY_CARE_PROVIDER_SITE_OTHER): Payer: Managed Care, Other (non HMO) | Admitting: Ophthalmology

## 2015-06-12 DIAGNOSIS — H35342 Macular cyst, hole, or pseudohole, left eye: Secondary | ICD-10-CM | POA: Diagnosis not present

## 2015-06-12 DIAGNOSIS — H2513 Age-related nuclear cataract, bilateral: Secondary | ICD-10-CM | POA: Diagnosis not present

## 2015-06-12 DIAGNOSIS — H43811 Vitreous degeneration, right eye: Secondary | ICD-10-CM

## 2015-06-27 ENCOUNTER — Ambulatory Visit (INDEPENDENT_AMBULATORY_CARE_PROVIDER_SITE_OTHER): Payer: Managed Care, Other (non HMO) | Admitting: Internal Medicine

## 2015-06-27 ENCOUNTER — Encounter: Payer: Self-pay | Admitting: Internal Medicine

## 2015-06-27 VITALS — BP 126/70 | HR 78 | Temp 98.0°F | Resp 16 | Ht 65.0 in | Wt 183.0 lb

## 2015-06-27 DIAGNOSIS — G44209 Tension-type headache, unspecified, not intractable: Secondary | ICD-10-CM | POA: Diagnosis not present

## 2015-06-27 DIAGNOSIS — R040 Epistaxis: Secondary | ICD-10-CM

## 2015-06-27 MED ORDER — RIZATRIPTAN BENZOATE 10 MG PO TABS
10.0000 mg | ORAL_TABLET | Freq: Once | ORAL | Status: DC | PRN
Start: 2015-06-27 — End: 2017-01-11

## 2015-06-27 MED ORDER — KETOROLAC TROMETHAMINE 30 MG/ML IJ SOLN
30.0000 mg | Freq: Once | INTRAMUSCULAR | Status: AC
  Administered 2015-06-27: 30 mg via INTRAMUSCULAR

## 2015-06-27 NOTE — Progress Notes (Signed)
Subjective:    Patient ID: Margaret Gilmore, female    DOB: 1955/11/25, 60 y.o.   MRN: [id number]  Epistaxis   Patient presents to the office for evaluation of headache which has been intermittent for the last 3 weeks.  She reports that the headaches are all over her head.  She reports that the headache came on and woke her up in the middle of the night.  She reports that she has been constant with some little bit of nausea.  She does seem to have some light sensitivity.  She has had a history of bad headaches in the past.  This headache is different from prior headaches.  She does have some sound sensitivity.  Some tightness in the base of the skull, but no actual neck pain.  No fevers no chills.  She has not vomited.  She has also been having some nosebleeds which has happened once daily for the last 3 days.  She is taking excedrin and flonase which help a little bit.  She has a stressful job working with dementia patients.    No head injury.  NO recent pregnancy.    Review of Systems  Constitutional: Negative for fever, chills and fatigue.  HENT: Positive for congestion, nosebleeds, postnasal drip and sinus pressure. Negative for ear pain, sore throat and trouble swallowing.   Eyes: Positive for photophobia. Negative for visual disturbance.  Neurological: Positive for headaches. Negative for dizziness, weakness, light-headedness and numbness.  Psychiatric/Behavioral: Positive for decreased concentration. Negative for confusion.       Objective:   Physical Exam  Constitutional: She is oriented to person, place, and time. She appears well-developed and well-nourished. No distress.  HENT:  Head: Normocephalic.  Mouth/Throat: Oropharynx is clear and moist. No oropharyngeal exudate.  Right nose scabbing on the later rim of the nare.    Eyes: Conjunctivae are normal. No scleral icterus.  Neck: Normal range of motion. Neck supple. No JVD present. No Brudzinski's sign and no Kernig's sign  noted. No thyromegaly present.  Cardiovascular: Normal rate, regular rhythm, normal heart sounds and intact distal pulses.  Exam reveals no gallop and no friction rub.   No murmur heard. Pulmonary/Chest: Effort normal and breath sounds normal. No respiratory distress. She has no wheezes. She has no rales. She exhibits no tenderness.  Abdominal: Soft. Bowel sounds are normal. She exhibits no distension and no mass. There is no tenderness. There is no rebound and no guarding.  Musculoskeletal: Normal range of motion.  Lymphadenopathy:    She has no cervical adenopathy.  Neurological: She is alert and oriented to person, place, and time. She has normal strength. No cranial nerve deficit or sensory deficit. Coordination normal.  Skin: Skin is warm and dry. She is not diaphoretic.  Psychiatric: She has a normal mood and affect. Her behavior is normal. Judgment and thought content normal.  Nursing note and vitals reviewed.   Filed Vitals:   06/27/15 0900  BP: 126/70  Pulse: 78  Temp: 98 F (36.7 C)  Resp: 16         Assessment & Plan:    1. Epistaxis -vaseline or aquaphor -avoid touching -ER if bleeding greater than 45 minutes  2. Tension headache  - rizatriptan (MAXALT) 10 MG tablet; Take 1 tablet (10 mg total) by mouth once as needed for migraine. May repeat in 2 hours if needed  Dispense: 30 tablet; Refill: 2 - ketorolac (TORADOL) 30 MG/ML injection 30 mg; Inject 1 mL (30 mg  total) into the muscle once.

## 2015-06-27 NOTE — Patient Instructions (Signed)
Rizatriptan tablets What is this medicine? RIZATRIPTAN (rye za TRIP tan) is used to treat migraines with or without aura. An aura is a strange feeling or visual disturbance that warns you of an attack. It is not used to prevent migraines. This medicine may be used for other purposes; ask your health care provider or pharmacist if you have questions. What should I tell my health care provider before I take this medicine? They need to know if you have any of these conditions: -bowel disease or colitis -diabetes -family history of heart disease -fast or irregular heart beat -heart or blood vessel disease, angina (chest pain), or previous heart attack -high blood pressure -high cholesterol -history of stroke, transient ischemic attacks (TIAs or mini-strokes), or intracranial bleeding -kidney or liver disease -overweight -poor circulation -postmenopausal or surgical removal of uterus and ovaries -Raynaud's disease -seizure disorder -an unusual or allergic reaction to rizatriptan, other medicines, foods, dyes, or preservatives -pregnant or trying to get pregnant -breast-feeding How should I use this medicine? This medicine is taken by mouth with a glass of water. Follow the directions on the prescription label. This medicine is taken at the first symptoms of a migraine. It is not for everyday use. If your migraine headache returns after one dose, you can take another dose as directed. You must leave at least 2 hours between doses, and do not take more than 30 mg total in 24 hours. If there is no improvement at all after the first dose, do not take a second dose without talking to your doctor or health care professional. Do not take your medicine more often than directed. Talk to your pediatrician regarding the use of this medicine in children. While this drug may be prescribed for children as young as 6 years for selected conditions, precautions do apply. Overdosage: If you think you have taken  too much of this medicine contact a poison control center or emergency room at once. NOTE: This medicine is only for you. Do not share this medicine with others. What if I miss a dose? This does not apply; this medicine is not for regular use. What may interact with this medicine? Do not take this medicine with any of the following medicines: -amphetamine, dextroamphetamine or cocaine -dihydroergotamine, ergotamine, ergoloid mesylates, methysergide, or ergot-type medication - do not take within 24 hours of taking rizatriptan -feverfew -MAOIs like Carbex, Eldepryl, Marplan, Nardil, and Parnate - do not take rizatriptan within 2 weeks of stopping MAOI therapy. -other migraine medicines like almotriptan, eletriptan, naratriptan, sumatriptan, zolmitriptan - do not take within 24 hours of taking rizatriptan -tryptophan This medicine may also interact with the following medications: -medicines for mental depression, anxiety or mood problems -propranolol This list may not describe all possible interactions. Give your health care provider a list of all the medicines, herbs, non-prescription drugs, or dietary supplements you use. Also tell them if you smoke, drink alcohol, or use illegal drugs. Some items may interact with your medicine. What should I watch for while using this medicine? Only take this medicine for a migraine headache. Take it if you get warning symptoms or at the start of a migraine attack. It is not for regular use to prevent migraine attacks. You may get drowsy or dizzy. Do not drive, use machinery, or do anything that needs mental alertness until you know how this medicine affects you. To reduce dizzy or fainting spells, do not sit or stand up quickly, especially if you are an older patient. Alcohol can increase   drowsiness, dizziness and flushing. Avoid alcoholic drinks. Smoking cigarettes may increase the risk of heart-related side effects from using this medicine. If you take  migraine medicines for 10 or more days a month, your migraines may get worse. Keep a diary of headache days and medicine use. Contact your healthcare professional if your migraine attacks occur more frequently. What side effects may I notice from receiving this medicine? Side effects that you should report to your doctor or health care professional as soon as possible: -allergic reactions like skin rash, itching or hives, swelling of the face, lips, or tongue -fast, slow, or irregular heart beat -increased or decreased blood pressure -seizures -severe stomach pain and cramping, bloody diarrhea -signs and symptoms of a blood clot such as breathing problems; changes in vision; chest pain; severe, sudden headache; pain, swelling, warmth in the leg; trouble speaking; sudden numbness or weakness of the face, arm or leg -tingling, pain, or numbness in the face, hands, or feet Side effects that usually do not require medical attention (report to your doctor or health care professional if they continue or are bothersome): -drowsiness -dry mouth -feeling warm, flushing, or redness of the face -headache -muscle cramps, pain -nausea, vomiting -unusually weak or tired This list may not describe all possible side effects. Call your doctor for medical advice about side effects. You may report side effects to FDA at 1-800-FDA-1088. Where should I keep my medicine? Keep out of the reach of children. Store at room temperature between 15 and 30 degrees C (59 and 86 degrees F). Keep container tightly closed. Throw away any unused medicine after the expiration date. NOTE: This sheet is a summary. It may not cover all possible information. If you have questions about this medicine, talk to your doctor, pharmacist, or health care provider.    2016, Elsevier/Gold Standard. (2013-01-03 10:16:39)  

## 2015-08-23 ENCOUNTER — Encounter: Payer: Self-pay | Admitting: Internal Medicine

## 2015-08-23 ENCOUNTER — Ambulatory Visit (INDEPENDENT_AMBULATORY_CARE_PROVIDER_SITE_OTHER): Payer: Managed Care, Other (non HMO) | Admitting: Internal Medicine

## 2015-08-23 VITALS — BP 122/60 | HR 70 | Temp 98.0°F | Resp 16 | Ht 65.0 in | Wt 179.0 lb

## 2015-08-23 DIAGNOSIS — F329 Major depressive disorder, single episode, unspecified: Secondary | ICD-10-CM | POA: Diagnosis not present

## 2015-08-23 DIAGNOSIS — F32A Depression, unspecified: Secondary | ICD-10-CM

## 2015-08-23 MED ORDER — ALPRAZOLAM 0.5 MG PO TABS: 0.5000 mg | ORAL_TABLET | Freq: Two times a day (BID) | ORAL | Status: AC | PRN

## 2015-08-23 MED ORDER — SERTRALINE HCL 50 MG PO TABS: 50.0000 mg | ORAL_TABLET | Freq: Every day | ORAL | Status: DC

## 2015-08-23 NOTE — Patient Instructions (Addendum)
For questions about this support group, call [phone]. Family, friends and caregivers of individuals with Alzheimer's are welcome to attend this free support group. The group meets on the second Monday of each month, from 1 to 2:30 p.m., in the [place] Day Room at the [facility] in [place].  This support group is open to caregivers. Meetings are held on the fourth Thursday of each month from noon to 1 p.m. at the [facility], Classroom [id number].  For information, call Epifania Gore at [phone].  Please take a 1/2 tablet of zoloft daily for a week to a week and a half.  If you have no side effects go ahead and start taking a full tablet daily.  Please take xanax as needed for really bad stressful days.

## 2015-08-23 NOTE — Progress Notes (Signed)
   Subjective:    Patient ID: Margaret Gilmore, female    DOB: 1955-10-02, 60 y.o.   MRN: [id number]  Anxiety Symptoms include nervous/anxious behavior. Patient reports no confusion, decreased concentration, dizziness, nausea or suicidal ideas.    Depression        Associated symptoms include no decreased concentration, no fatigue and no suicidal ideas.  Past medical history includes anxiety.    Patient presents to the office for evaluation of anxiety and depression.  She reports that work has been giving her a lot of stress due to a combative patient at work.  She is also noting that she has a lot going on at home which has been stressful financially.  She has used an antidepressant in the past.  She did use zoloft without issue.    Review of Systems  Constitutional: Negative for fever, chills and fatigue.  Gastrointestinal: Negative for nausea and vomiting.  Neurological: Negative for dizziness, speech difficulty and light-headedness.  Psychiatric/Behavioral: Positive for depression. Negative for suicidal ideas, hallucinations, behavioral problems, confusion, sleep disturbance and decreased concentration. The patient is nervous/anxious.        Objective:   Physical Exam  Constitutional: She is oriented to person, place, and time. She appears well-developed and well-nourished. No distress.  HENT:  Head: Normocephalic.  Mouth/Throat: Oropharynx is clear and moist. No oropharyngeal exudate.  Eyes: Conjunctivae are normal. No scleral icterus.  Neck: Normal range of motion. Neck supple. No JVD present. No thyromegaly present.  Cardiovascular: Normal rate, regular rhythm, normal heart sounds and intact distal pulses.  Exam reveals no gallop and no friction rub.   No murmur heard. Pulmonary/Chest: Breath sounds normal. No respiratory distress. She has no wheezes. She has no rales. She exhibits no tenderness.  Abdominal: Soft. Bowel sounds are normal. She exhibits no distension and no mass.  There is no tenderness. There is no rebound and no guarding.  Musculoskeletal: Normal range of motion.  Lymphadenopathy:    She has no cervical adenopathy.  Neurological: She is alert and oriented to person, place, and time.  Skin: Skin is warm and dry. She is not diaphoretic.  Psychiatric: She has a normal mood and affect. Her behavior is normal. Judgment and thought content normal.  Nursing note and vitals reviewed.   Filed Vitals:   08/23/15 1039  BP: 122/60  Pulse: 70  Temp: 98 F (36.7 C)  Resp: 16           Assessment & Plan:    1. Depression -zoloft -xanax -recheck in 6 weeks

## 2015-09-30 ENCOUNTER — Encounter: Payer: Self-pay | Admitting: Internal Medicine

## 2015-09-30 ENCOUNTER — Ambulatory Visit (INDEPENDENT_AMBULATORY_CARE_PROVIDER_SITE_OTHER): Payer: Managed Care, Other (non HMO) | Admitting: Internal Medicine

## 2015-09-30 VITALS — BP 122/80 | HR 76 | Temp 98.2°F | Resp 18 | Ht 65.0 in

## 2015-09-30 DIAGNOSIS — J069 Acute upper respiratory infection, unspecified: Secondary | ICD-10-CM | POA: Diagnosis not present

## 2015-09-30 MED ORDER — PSEUDOEPH-BROMPHEN-DM 30-2-10 MG/5ML PO SYRP: 5.0000 mL | ORAL_SOLUTION | Freq: Four times a day (QID) | ORAL | Status: DC | PRN

## 2015-09-30 MED ORDER — DEXAMETHASONE SODIUM PHOSPHATE 100 MG/10ML IJ SOLN
10.0000 mg | Freq: Once | INTRAMUSCULAR | Status: AC
  Administered 2015-09-30: 10 mg via INTRAMUSCULAR

## 2015-09-30 MED ORDER — AZITHROMYCIN 250 MG PO TABS: ORAL_TABLET | ORAL | Status: DC

## 2015-09-30 NOTE — Progress Notes (Signed)
HPI  Patient presents to the office for evaluation of cough.  It has been going on for 5 days.  Patient reports night > day, dry, barky, worse with lying down.  They also endorse change in voice, chills, postnasal drip, shortness of breath and nasal congestion, sinus pressure, clear rhinorrhea, sensitive ears, and some sore throat..  They have tried mucinex.  They report that nothing has worked.  They denies other sick contacts.  Review of Systems  Constitutional: Positive for chills and malaise/fatigue. Negative for fever.  HENT: Positive for congestion, ear pain and sore throat.   Respiratory: Positive for cough and shortness of breath. Negative for sputum production and wheezing.   Cardiovascular: Negative for chest pain, palpitations and leg swelling.  Skin: Negative.   Neurological: Positive for headaches.    PE:  Filed Vitals:   09/30/15 1520  BP: 122/80  Pulse: 76  Temp: 98.2 F (36.8 C)  Resp: 18    General:  Alert and non-toxic, WDWN, NAD HEENT: NCAT, PERLA, EOM normal, no occular discharge or erythema.  Nasal mucosal edema with sinus tenderness to palpation.  Oropharynx clear with minimal oropharyngeal edema and erythema.  Mucous membranes moist and pink. Neck:  Cervical adenopathy Chest:  RRR no MRGs.  Lungs clear to auscultation A&P with no wheezes rhonchi or rales.   Abdomen: +BS x 4 quadrants, soft, non-tender, no guarding, rigidity, or rebound. Skin: warm and dry no rash Neuro: A&Ox4, CN II-XII grossly intact  Assessment and Plan:   1. Acute URI -nasal saline -aquaphor -zyrtec -flonase - brompheniramine-pseudoephedrine-DM 30-2-10 MG/5ML syrup; Take 5 mLs by mouth 4 (four) times daily as needed.  Dispense: 473 mL; Refill: 0 - azithromycin (ZITHROMAX Z-PAK) 250 MG tablet; 2 po day one, then 1 daily x 4 days  Dispense: 6 tablet; Refill: 0 - dexamethasone (DECADRON) injection 10 mg; Inject 1 mL (10 mg total) into the muscle once.

## 2015-09-30 NOTE — Patient Instructions (Signed)
Please use saline in your nose as often as possible to rinse out nose.  Please continue to use aquaphor in the nose.  Please use cough syrup up to 4 times daily.   Please use zyrtec nightly.  Please use nasal spray one spray per nostril at bedtime.  Please take zpak until it is gone.  Please stop mucinex.

## 2015-12-04 ENCOUNTER — Ambulatory Visit (INDEPENDENT_AMBULATORY_CARE_PROVIDER_SITE_OTHER): Payer: Managed Care, Other (non HMO) | Admitting: Ophthalmology

## 2015-12-04 DIAGNOSIS — H35342 Macular cyst, hole, or pseudohole, left eye: Secondary | ICD-10-CM | POA: Diagnosis not present

## 2015-12-04 DIAGNOSIS — H43811 Vitreous degeneration, right eye: Secondary | ICD-10-CM

## 2015-12-10 ENCOUNTER — Ambulatory Visit (INDEPENDENT_AMBULATORY_CARE_PROVIDER_SITE_OTHER): Payer: Managed Care, Other (non HMO) | Admitting: Ophthalmology

## 2016-05-01 ENCOUNTER — Other Ambulatory Visit: Payer: Self-pay | Admitting: Occupational Medicine

## 2016-05-01 ENCOUNTER — Ambulatory Visit: Payer: Self-pay

## 2016-05-01 DIAGNOSIS — M79631 Pain in right forearm: Secondary | ICD-10-CM

## 2016-05-01 DIAGNOSIS — M25521 Pain in right elbow: Secondary | ICD-10-CM

## 2016-12-02 ENCOUNTER — Ambulatory Visit (INDEPENDENT_AMBULATORY_CARE_PROVIDER_SITE_OTHER): Payer: Managed Care, Other (non HMO) | Admitting: Ophthalmology

## 2016-12-03 ENCOUNTER — Ambulatory Visit (INDEPENDENT_AMBULATORY_CARE_PROVIDER_SITE_OTHER): Payer: Managed Care, Other (non HMO) | Admitting: Ophthalmology

## 2016-12-09 ENCOUNTER — Ambulatory Visit (INDEPENDENT_AMBULATORY_CARE_PROVIDER_SITE_OTHER): Payer: BLUE CROSS/BLUE SHIELD | Admitting: Ophthalmology

## 2016-12-09 DIAGNOSIS — H353111 Nonexudative age-related macular degeneration, right eye, early dry stage: Secondary | ICD-10-CM | POA: Diagnosis not present

## 2016-12-09 DIAGNOSIS — H43811 Vitreous degeneration, right eye: Secondary | ICD-10-CM | POA: Diagnosis not present

## 2016-12-09 DIAGNOSIS — H35343 Macular cyst, hole, or pseudohole, bilateral: Secondary | ICD-10-CM | POA: Diagnosis not present

## 2017-01-11 ENCOUNTER — Encounter: Payer: Self-pay | Admitting: Physician Assistant

## 2017-01-11 ENCOUNTER — Ambulatory Visit (INDEPENDENT_AMBULATORY_CARE_PROVIDER_SITE_OTHER): Payer: BLUE CROSS/BLUE SHIELD | Admitting: Physician Assistant

## 2017-01-11 VITALS — BP 120/76 | HR 85 | Temp 97.7°F | Resp 14 | Ht 65.0 in | Wt 186.8 lb

## 2017-01-11 DIAGNOSIS — R1010 Upper abdominal pain, unspecified: Secondary | ICD-10-CM

## 2017-01-11 DIAGNOSIS — K2100 Gastro-esophageal reflux disease with esophagitis, without bleeding: Secondary | ICD-10-CM

## 2017-01-11 MED ORDER — SUCRALFATE 1 G PO TABS: 1.0000 g | ORAL_TABLET | Freq: Three times a day (TID) | ORAL | 1 refills | Status: DC

## 2017-01-11 MED ORDER — PANTOPRAZOLE SODIUM 40 MG PO TBEC: 40.0000 mg | DELAYED_RELEASE_TABLET | Freq: Every day | ORAL | 3 refills | Status: DC

## 2017-01-11 NOTE — Progress Notes (Signed)
Subjective:    Patient ID: Margaret Gilmore, female    DOB: 08/03/1955, 61 y.o.   MRN: [id number]  HPI 61 y.o. WF presents with indigestion/chest pain.  Has been several months, does tablespoon of apple vinegar. Has history of GERD and has had EGD/dilitation in 2015. Worse with food, never with exertion. Fried foods make it worse, no nausea/vomiting. Bread feels like it gets caught.  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 Has been on omeprazole OTC 20mg , will take 2. Only takes NSAIDs once a week for migraines occ two, no ETOH. Dad with MI between 34-65. Never smoker.   Blood pressure 120/76, pulse 85, temperature 97.7 F (36.5 C), resp. rate 14, height 5\' 5"  (1.651 m), weight 186 lb 12.8 oz (84.7 kg), SpO2 97 %.  Medications Current Outpatient Prescriptions on File Prior to Visit  Medication Sig  . aspirin-acetaminophen-caffeine (EXCEDRIN MIGRAINE) 250-250-65 MG tablet Take 1 tablet by mouth every 6 (six) hours as needed for headache.   No current facility-administered medications on file prior to visit.     Problem list She has Depression; Unspecified vitamin D deficiency; Anxiety; B12 deficiency; Prediabetes; and Macular hole, left eye on her problem list.   Review of Systems  Constitutional: Negative.  Negative for chills, fatigue and fever.  HENT: Negative.  Negative for sore throat.   Eyes: Negative.   Respiratory: Negative for apnea, cough, choking, chest tightness, shortness of breath, wheezing and stridor.   Cardiovascular: Positive for chest pain. Negative for palpitations and leg swelling.  Gastrointestinal: Positive for abdominal pain. Negative for abdominal distention, anal bleeding, blood in stool, constipation, diarrhea, nausea, rectal pain and vomiting.  Genitourinary: Negative.   Musculoskeletal: Negative.   Skin: Negative.   Neurological: Negative.   Hematological: Negative.    Psychiatric/Behavioral: Negative.        Objective:   Physical Exam  Constitutional: She is oriented to person, place, and time. She appears well-developed and well-nourished.  HENT:  Head: Normocephalic and atraumatic.  Right Ear: External ear normal.  Left Ear: External ear normal.  Mouth/Throat: Oropharynx is clear and moist.  Eyes: Pupils are equal, round, and reactive to light. Conjunctivae and EOM are normal.  Neck: Normal range of motion. Neck supple. No thyromegaly present.  Cardiovascular: Normal rate, regular rhythm and normal heart sounds.  Exam reveals no gallop and no friction rub.   No murmur heard. Pulmonary/Chest: Effort normal and breath sounds normal. No respiratory distress. She has no wheezes.  Abdominal: Soft. Bowel sounds are normal. She exhibits distension. She exhibits no shifting dullness, no abdominal bruit, no pulsatile midline mass and no mass. There is no hepatosplenomegaly. There is tenderness in the right upper quadrant and epigastric area. There is no rigidity, no rebound, no guarding, no CVA tenderness, no tenderness at McBurney's point and negative Murphy's sign (negative). No hernia.  Musculoskeletal: Normal range of motion.  Lymphadenopathy:    She has no cervical adenopathy.  Neurological: She is alert and oriented to person, place, and time.  Skin: Skin is warm and dry.  Psychiatric: She has a normal mood and affect.       Assessment & Plan:   Pain of upper abdomen and GERD Epigastric pain  DDX GERD, ulcer ,GB, pancreatitis-  check labs, bland diet, small portions, increase H20, PPI if pain continues referral to GI Patient advised to go to the ER if the  symptoms increase or worsen.   -     US Abdomen Complete; Future -     CBC with Differential/Platelet -     BASIC METABOLIC PANEL WITH GFR -     Hepatic function panel -     Amylase -     pantoprazole (PROTONIX) 40 MG tablet; Take 1 tablet (40 mg total) by mouth daily. -     sucralfate  (CARAFATE) 1 g tablet; Take 1 tablet (1 g total) by mouth 4 (four) times daily -  with meals and at bedtime.

## 2017-01-11 NOTE — Patient Instructions (Signed)

## 2017-01-12 NOTE — Progress Notes (Signed)
Pt aware of lab results & voiced understanding of those results.

## 2017-01-14 LAB — HEPATIC FUNCTION PANEL
AG Ratio: 1.6 (calc) (ref 1.0–2.5)
ALT: 12 U/L (ref 6–29)
AST: 12 U/L (ref 10–35)
Albumin: 4.2 g/dL (ref 3.6–5.1)
Alkaline phosphatase (APISO): 68 U/L (ref 33–130)
Bilirubin, Direct: 0.1 mg/dL (ref 0.0–0.2)
Globulin: 2.7 g/dL (calc) (ref 1.9–3.7)
Indirect Bilirubin: 0.3 mg/dL (calc) (ref 0.2–1.2)
Total Bilirubin: 0.4 mg/dL (ref 0.2–1.2)
Total Protein: 6.9 g/dL (ref 6.1–8.1)

## 2017-01-14 LAB — BASIC METABOLIC PANEL WITH GFR
BUN: 23 mg/dL (ref 7–25)
CO2: 28 mmol/L (ref 20–32)
Calcium: 9.4 mg/dL (ref 8.6–10.4)
Chloride: 105 mmol/L (ref 98–110)
Creat: 0.77 mg/dL (ref 0.50–0.99)
GFR, Est African American: 96 mL/min/{1.73_m2} (ref >=60)
GFR, Est Non African American: 83 mL/min/{1.73_m2} (ref >=60)
Glucose, Bld: 129 mg/dL — ABNORMAL HIGH (ref 65–99)
Potassium: 3.9 mmol/L (ref 3.5–5.3)
Sodium: 142 mmol/L (ref 135–146)

## 2017-01-14 LAB — CBC WITH DIFFERENTIAL/PLATELET
Basophils Absolute: 43 cells/uL (ref 0–200)
Basophils Relative: 0.6 %
Eosinophils Absolute: 94 cells/uL (ref 15–500)
Eosinophils Relative: 1.3 %
HCT: 37.6 % (ref 35.0–45.0)
Hemoglobin: 12.6 g/dL (ref 11.7–15.5)
Lymphs Abs: 2909 cells/uL (ref 850–3900)
MCH: 29.3 pg (ref 27.0–33.0)
MCHC: 33.5 g/dL (ref 32.0–36.0)
MCV: 87.4 fL (ref 80.0–100.0)
MPV: 10.1 fL (ref 7.5–12.5)
Monocytes Relative: 5.9 %
Neutro Abs: 3730 cells/uL (ref 1500–7800)
Neutrophils Relative %: 51.8 %
Platelets: 312 10*3/uL (ref 140–400)
RBC: 4.3 10*6/uL (ref 3.80–5.10)
RDW: 13 % (ref 11.0–15.0)
Total Lymphocyte: 40.4 %
WBC mixed population: 425 cells/uL (ref 200–950)
WBC: 7.2 10*3/uL (ref 3.8–10.8)

## 2017-01-14 LAB — AMYLASE: Amylase: 48 U/L (ref 21–101)

## 2017-01-20 ENCOUNTER — Ambulatory Visit
Admission: RE | Admit: 2017-01-20 | Discharge: 2017-01-20 | Disposition: A | Discharge status: Home / Self Care | Payer: BLUE CROSS/BLUE SHIELD | Source: Ambulatory Visit | Attending: Physician Assistant | Admitting: Physician Assistant

## 2017-01-20 DIAGNOSIS — K2100 Gastro-esophageal reflux disease with esophagitis, without bleeding: Secondary | ICD-10-CM

## 2017-01-20 DIAGNOSIS — R1010 Upper abdominal pain, unspecified: Secondary | ICD-10-CM

## 2017-01-21 NOTE — Progress Notes (Signed)
Pt aware of lab results & voiced understanding of those results. Pt was transferred to front office to schedule a follow up appt.

## 2017-05-28 ENCOUNTER — Encounter (INDEPENDENT_AMBULATORY_CARE_PROVIDER_SITE_OTHER): Payer: BLUE CROSS/BLUE SHIELD | Admitting: Ophthalmology

## 2017-05-28 DIAGNOSIS — H2511 Age-related nuclear cataract, right eye: Secondary | ICD-10-CM | POA: Diagnosis not present

## 2017-05-28 DIAGNOSIS — H43813 Vitreous degeneration, bilateral: Secondary | ICD-10-CM

## 2017-05-28 DIAGNOSIS — H35343 Macular cyst, hole, or pseudohole, bilateral: Secondary | ICD-10-CM | POA: Diagnosis not present

## 2017-05-31 NOTE — H&P (Signed)
Margaret Gilmore is an 62 y.o. female.   Chief Complaint:loss of vision right eye HPI: noted loss of central vision in the right eye three months ago.  No pain  Past Medical History:  Diagnosis Date  . Anxiety   . Arthritis   . B12 deficiency    hx of , no longer taking medication  . Cataracts, bilateral   . Constipation   . Depression   . Family history of adverse reaction to anesthesia    mom had problems waking up  . GERD (gastroesophageal reflux disease)   . Headache    migraines  . Macular hole of left eye   . Trigeminal neuralgia    right side of face is numb  . Unspecified vitamin D deficiency     Past Surgical History:  Procedure Laterality Date  . [place] VITRECTOMY WITH 20 GAUGE MVR PORT FOR MACULAR HOLE Left 03/05/2015   Procedure: 25 GAUGE PARS PLANA VITRECTOMY WITH 20 GAUGE MVR PORT FOR MACULAR HOLE;  Surgeon: [name], MD;  Location: [place];  Service: Ophthalmology;  Laterality: Left;  . ABDOMINAL HYSTERECTOMY    . APPENDECTOMY    . CESAREAN SECTION    . COLONOSCOPY    . esophagus stretched    . GAS/FLUID EXCHANGE Left 03/05/2015   Procedure: GAS/FLUID EXCHANGE;  Surgeon: [name], MD;  Location: [place];  Service: Ophthalmology;  Laterality: Left;  CF38  . knot removed from right side of abdomen    . LASER PHOTO ABLATION Left 03/05/2015   Procedure: LASER PHOTO ABLATION;  Surgeon: [name], MD;  Location: [place];  Service: Ophthalmology;  Laterality: Left;  Headscope Laser  . MEMBRANE PEEL Left 03/05/2015   Procedure: MEMBRANE PEEL;  Surgeon: [name], MD;  Location: [place];  Service: Ophthalmology;  Laterality: Left;  . SERUM PATCH Left 03/05/2015   Procedure: SERUM PATCH;  Surgeon: [name], MD;  Location: [place];  Service: Ophthalmology;  Laterality: Left;  . trigeminal       Family History  Problem Relation Age of Onset  . Pancreatic cancer Father   . Hyperlipidemia Father   . Heart disease Father   .  Hypertension Father   . Diabetes Mother   . COPD Mother   . Hyperlipidemia Mother   . Hypertension Brother   . Dementia Maternal Grandmother   . Cancer Maternal Grandfather        bone  . Cancer Maternal Aunt        BREAST  . Cancer Paternal Aunt        HODGKINS  . Cancer Paternal Uncle        BONE  . Colon cancer Paternal Uncle    Social History:  reports that  has never smoked. she has never used smokeless tobacco. She reports that she does not drink alcohol or use drugs.  Allergies:  Allergies  Allergen Reactions  . Eggs Or Egg-Derived Products     Headaches  . Lyrica [Pregabalin]     swelling  . Sulfa Antibiotics     Headache    No medications prior to admission.    Review of systems otherwise negative  There were no vitals taken for this visit.  Physical exam: Mental status: oriented x3. Eyes: See eye exam associated with this date of surgery in media tab.  Scanned in by scanning center Ears, Nose, Throat: within normal limits Neck: Within Normal limits General: within normal limits  Chest: Within normal limits Breast: deferred Heart: Within normal limits Abdomen: Within normal limits GU: deferred Extremities: within normal limits Skin: within normal limits  Assessment/Plan Macular hole right eye Plan: To [facility] for pars plana vitrectomy, membrane peel, ILM peeling, laser, serum patch, gas injection right eye  [name] 05/31/2017, 9:03 AM

## 2017-06-15 ENCOUNTER — Encounter (INDEPENDENT_AMBULATORY_CARE_PROVIDER_SITE_OTHER): Payer: BLUE CROSS/BLUE SHIELD | Admitting: Ophthalmology

## 2017-06-15 DIAGNOSIS — H35343 Macular cyst, hole, or pseudohole, bilateral: Secondary | ICD-10-CM

## 2017-06-21 ENCOUNTER — Encounter (HOSPITAL_COMMUNITY): Payer: Self-pay | Admitting: *Deleted

## 2017-06-21 ENCOUNTER — Other Ambulatory Visit: Payer: Self-pay

## 2017-06-21 NOTE — Progress Notes (Signed)
Spoke with pt for pre-op call. Pt denies cardiac history, chest pain, sob or diabetes. Dr. Zigmund Daniel instructed pt not to take any medications in the AM and to arrive at 9:00 AM.

## 2017-06-22 ENCOUNTER — Ambulatory Visit (HOSPITAL_COMMUNITY): Payer: BLUE CROSS/BLUE SHIELD | Admitting: Certified Registered Nurse Anesthetist

## 2017-06-22 ENCOUNTER — Encounter (HOSPITAL_COMMUNITY)
Admission: RE | Disposition: A | Discharge status: Home / Self Care | Payer: Self-pay | Source: Ambulatory Visit | Attending: Ophthalmology

## 2017-06-22 ENCOUNTER — Other Ambulatory Visit: Payer: Self-pay

## 2017-06-22 ENCOUNTER — Ambulatory Visit (HOSPITAL_COMMUNITY)
Admission: RE | Admit: 2017-06-22 | Discharge: 2017-06-23 | Disposition: A | Discharge status: Home / Self Care | Payer: BLUE CROSS/BLUE SHIELD | Source: Ambulatory Visit | Attending: Ophthalmology | Admitting: Ophthalmology

## 2017-06-22 ENCOUNTER — Encounter (HOSPITAL_COMMUNITY): Payer: Self-pay | Admitting: Certified Registered Nurse Anesthetist

## 2017-06-22 DIAGNOSIS — Z79899 Other long term (current) drug therapy: Secondary | ICD-10-CM | POA: Insufficient documentation

## 2017-06-22 DIAGNOSIS — K219 Gastro-esophageal reflux disease without esophagitis: Secondary | ICD-10-CM | POA: Insufficient documentation

## 2017-06-22 DIAGNOSIS — H35341 Macular cyst, hole, or pseudohole, right eye: Secondary | ICD-10-CM | POA: Diagnosis present

## 2017-06-22 DIAGNOSIS — G500 Trigeminal neuralgia: Secondary | ICD-10-CM | POA: Diagnosis not present

## 2017-06-22 HISTORY — PX: MEMBRANE PEEL: SHX5967

## 2017-06-22 HISTORY — PX: 25 GAUGE PARS PLANA VITRECTOMY WITH 20 GAUGE MVR PORT FOR MACULAR HOLE: SHX6096

## 2017-06-22 HISTORY — DX: Fatty (change of) liver, not elsewhere classified: K76.0

## 2017-06-22 HISTORY — PX: LASER PHOTO ABLATION: SHX5942

## 2017-06-22 HISTORY — PX: GAS/FLUID EXCHANGE: SHX5334

## 2017-06-22 HISTORY — PX: SERUM PATCH: SHX6091

## 2017-06-22 LAB — CBC
HCT: 40.4 % (ref 36.0–46.0)
Hemoglobin: 13.3 g/dL (ref 12.0–15.0)
MCH: 29.4 pg (ref 26.0–34.0)
MCHC: 32.9 g/dL (ref 30.0–36.0)
MCV: 89.4 fL (ref 78.0–100.0)
Platelets: 324 10*3/uL (ref 150–400)
RBC: 4.52 MIL/uL (ref 3.87–5.11)
RDW: 13.4 % (ref 11.5–15.5)
WBC: 5.5 10*3/uL (ref 4.0–10.5)

## 2017-06-22 LAB — AUTOLOGOUS SERUM PATCH PREP

## 2017-06-22 SURGERY — 25 GAUGE PARS PLANA VITRECTOMY WITH 20 GAUGE MVR PORT FOR MACULAR HOLE
Anesthesia: General | Site: Eye | Laterality: Right

## 2017-06-22 MED ORDER — LACTATED RINGERS IV SOLN: INTRAVENOUS | Status: DC

## 2017-06-22 MED ORDER — STERILE WATER FOR INJECTION IJ SOLN
INTRAMUSCULAR | Status: AC
  Filled 2017-06-22: qty 20

## 2017-06-22 MED ORDER — ONDANSETRON HCL 4 MG/2ML IJ SOLN: 4.0000 mg | Freq: Four times a day (QID) | INTRAMUSCULAR | Status: DC | PRN

## 2017-06-22 MED ORDER — MIDAZOLAM HCL 2 MG/2ML IJ SOLN
INTRAMUSCULAR | Status: AC
  Filled 2017-06-22: qty 2

## 2017-06-22 MED ORDER — ACETAZOLAMIDE SODIUM 500 MG IJ SOLR
500.0000 mg | Freq: Once | INTRAMUSCULAR | Status: AC
  Administered 2017-06-23: 500 mg via INTRAVENOUS
  Filled 2017-06-22: qty 500

## 2017-06-22 MED ORDER — PANTOPRAZOLE SODIUM 40 MG PO TBEC: 40.0000 mg | DELAYED_RELEASE_TABLET | Freq: Every day | ORAL | Status: DC | PRN

## 2017-06-22 MED ORDER — DORZOLAMIDE HCL 2 % OP SOLN
1.0000 [drp] | Freq: Three times a day (TID) | OPHTHALMIC | Status: DC
  Filled 2017-06-22 (×2): qty 10

## 2017-06-22 MED ORDER — GATIFLOXACIN 0.5 % OP SOLN
1.0000 [drp] | OPHTHALMIC | Status: AC | PRN
  Administered 2017-06-22 (×2): 1 [drp] via OPHTHALMIC

## 2017-06-22 MED ORDER — CEFTAZIDIME 1 G IJ SOLR
INTRAMUSCULAR | Status: AC
  Filled 2017-06-22: qty 1

## 2017-06-22 MED ORDER — BRIMONIDINE TARTRATE 0.2 % OP SOLN
1.0000 [drp] | Freq: Two times a day (BID) | OPHTHALMIC | Status: DC
  Filled 2017-06-22: qty 5

## 2017-06-22 MED ORDER — ONDANSETRON HCL 4 MG/2ML IJ SOLN
INTRAMUSCULAR | Status: DC | PRN
  Administered 2017-06-22: 4 mg via INTRAVENOUS

## 2017-06-22 MED ORDER — BACITRACIN-POLYMYXIN B 500-10000 UNIT/GM OP OINT
1.0000 "application " | TOPICAL_OINTMENT | Freq: Three times a day (TID) | OPHTHALMIC | Status: DC
  Filled 2017-06-22: qty 3.5

## 2017-06-22 MED ORDER — HEMOSTATIC AGENTS (NO CHARGE) OPTIME
TOPICAL | Status: DC | PRN
  Administered 2017-06-22: 1 via TOPICAL

## 2017-06-22 MED ORDER — METOCLOPRAMIDE HCL 5 MG/ML IJ SOLN
10.0000 mg | Freq: Once | INTRAMUSCULAR | Status: DC | PRN
Start: 2017-06-22 — End: 2017-06-22

## 2017-06-22 MED ORDER — ROCURONIUM BROMIDE 10 MG/ML (PF) SYRINGE
PREFILLED_SYRINGE | INTRAVENOUS | Status: AC
  Filled 2017-06-22: qty 5

## 2017-06-22 MED ORDER — EPINEPHRINE PF 1 MG/ML IJ SOLN
INTRAMUSCULAR | Status: DC | PRN
  Administered 2017-06-22: .3 mL

## 2017-06-22 MED ORDER — SODIUM HYALURONATE 10 MG/ML IO SOLN
INTRAOCULAR | Status: DC | PRN
  Administered 2017-06-22: 0.85 mL via INTRAOCULAR

## 2017-06-22 MED ORDER — DEXAMETHASONE SODIUM PHOSPHATE 10 MG/ML IJ SOLN
INTRAMUSCULAR | Status: DC | PRN
  Administered 2017-06-22: 10 mg

## 2017-06-22 MED ORDER — SUGAMMADEX SODIUM 200 MG/2ML IV SOLN
INTRAVENOUS | Status: AC
  Filled 2017-06-22: qty 2

## 2017-06-22 MED ORDER — ATROPINE SULFATE 1 % OP SOLN
OPHTHALMIC | Status: DC | PRN
  Administered 2017-06-22: 1 [drp] via OPHTHALMIC

## 2017-06-22 MED ORDER — FENTANYL CITRATE (PF) 250 MCG/5ML IJ SOLN
INTRAMUSCULAR | Status: AC
  Filled 2017-06-22: qty 5

## 2017-06-22 MED ORDER — PROPOFOL 10 MG/ML IV BOLUS
INTRAVENOUS | Status: AC
  Filled 2017-06-22: qty 20

## 2017-06-22 MED ORDER — CYCLOPENTOLATE HCL 1 % OP SOLN
1.0000 [drp] | OPHTHALMIC | Status: AC | PRN
  Administered 2017-06-22 (×2): 1 [drp] via OPHTHALMIC

## 2017-06-22 MED ORDER — SODIUM HYALURONATE 10 MG/ML IO SOLN
INTRAOCULAR | Status: AC
  Filled 2017-06-22: qty 0.85

## 2017-06-22 MED ORDER — DEXAMETHASONE SODIUM PHOSPHATE 10 MG/ML IJ SOLN
INTRAMUSCULAR | Status: AC
  Filled 2017-06-22: qty 1

## 2017-06-22 MED ORDER — PHENYLEPHRINE HCL 2.5 % OP SOLN
1.0000 [drp] | OPHTHALMIC | Status: AC | PRN
  Administered 2017-06-22 (×2): 1 [drp] via OPHTHALMIC

## 2017-06-22 MED ORDER — LIDOCAINE HCL (CARDIAC) 20 MG/ML IV SOLN
INTRAVENOUS | Status: DC | PRN
  Administered 2017-06-22: 80 mg via INTRAVENOUS

## 2017-06-22 MED ORDER — ACETAZOLAMIDE SODIUM 500 MG IJ SOLR
INTRAMUSCULAR | Status: AC
  Filled 2017-06-22: qty 500

## 2017-06-22 MED ORDER — ROCURONIUM BROMIDE 100 MG/10ML IV SOLN
INTRAVENOUS | Status: DC | PRN
  Administered 2017-06-22: 50 mg via INTRAVENOUS
  Administered 2017-06-22: 20 mg via INTRAVENOUS

## 2017-06-22 MED ORDER — POLYMYXIN B SULFATE 500000 UNITS IJ SOLR
INTRAMUSCULAR | Status: AC
  Filled 2017-06-22: qty 500000

## 2017-06-22 MED ORDER — TRIAMCINOLONE ACETONIDE 40 MG/ML IJ SUSP
INTRAMUSCULAR | Status: AC
  Filled 2017-06-22: qty 5

## 2017-06-22 MED ORDER — LATANOPROST 0.005 % OP SOLN
1.0000 [drp] | Freq: Once | OPHTHALMIC | Status: AC
  Administered 2017-06-22: 1 [drp] via OPHTHALMIC
  Filled 2017-06-22 (×2): qty 2.5

## 2017-06-22 MED ORDER — TROPICAMIDE 1 % OP SOLN
1.0000 [drp] | OPHTHALMIC | Status: AC | PRN
  Administered 2017-06-22 (×2): 1 [drp] via OPHTHALMIC

## 2017-06-22 MED ORDER — EPINEPHRINE PF 1 MG/ML IJ SOLN
INTRAMUSCULAR | Status: AC
  Filled 2017-06-22: qty 1

## 2017-06-22 MED ORDER — CYCLOPENTOLATE HCL 1 % OP SOLN
1.0000 [drp] | OPHTHALMIC | Status: AC | PRN
  Administered 2017-06-22: 1 [drp] via OPHTHALMIC
  Filled 2017-06-22: qty 2

## 2017-06-22 MED ORDER — TETRACAINE HCL 0.5 % OP SOLN
2.0000 [drp] | Freq: Once | OPHTHALMIC | Status: DC
  Filled 2017-06-22: qty 4

## 2017-06-22 MED ORDER — MEPERIDINE HCL 25 MG/ML IJ SOLN: 6.2500 mg | INTRAMUSCULAR | Status: DC | PRN

## 2017-06-22 MED ORDER — HYALURONIDASE HUMAN 150 UNIT/ML IJ SOLN
INTRAMUSCULAR | Status: AC
  Filled 2017-06-22: qty 1

## 2017-06-22 MED ORDER — SODIUM CHLORIDE 0.9 % IJ SOLN
INTRAMUSCULAR | Status: AC
  Filled 2017-06-22: qty 10

## 2017-06-22 MED ORDER — TEMAZEPAM 15 MG PO CAPS: 15.0000 mg | ORAL_CAPSULE | Freq: Every evening | ORAL | Status: DC | PRN

## 2017-06-22 MED ORDER — MAGNESIUM HYDROXIDE 400 MG/5ML PO SUSP: 15.0000 mL | Freq: Four times a day (QID) | ORAL | Status: DC | PRN

## 2017-06-22 MED ORDER — BSS IO SOLN
INTRAOCULAR | Status: AC
  Filled 2017-06-22: qty 15

## 2017-06-22 MED ORDER — PREDNISOLONE ACETATE 1 % OP SUSP
1.0000 [drp] | Freq: Four times a day (QID) | OPHTHALMIC | Status: DC
  Filled 2017-06-22: qty 5

## 2017-06-22 MED ORDER — BSS PLUS IO SOLN
INTRAOCULAR | Status: AC
  Filled 2017-06-22: qty 500

## 2017-06-22 MED ORDER — BUPIVACAINE HCL (PF) 0.75 % IJ SOLN
INTRAMUSCULAR | Status: DC | PRN
  Administered 2017-06-22: 10 mL

## 2017-06-22 MED ORDER — MIDAZOLAM HCL 5 MG/5ML IJ SOLN
INTRAMUSCULAR | Status: DC | PRN
  Administered 2017-06-22: 2 mg via INTRAVENOUS

## 2017-06-22 MED ORDER — EPHEDRINE SULFATE 50 MG/ML IJ SOLN
INTRAMUSCULAR | Status: DC | PRN
  Administered 2017-06-22 (×2): 15 mg via INTRAVENOUS
  Administered 2017-06-22: 20 mg via INTRAVENOUS

## 2017-06-22 MED ORDER — STERILE WATER FOR INJECTION IJ SOLN
INTRAMUSCULAR | Status: DC | PRN
  Administered 2017-06-22: 20 mL

## 2017-06-22 MED ORDER — CEFAZOLIN SODIUM-DEXTROSE 2-4 GM/100ML-% IV SOLN
2.0000 g | INTRAVENOUS | Status: AC
  Administered 2017-06-22: 2 g via INTRAVENOUS
  Filled 2017-06-22: qty 100

## 2017-06-22 MED ORDER — BSS IO SOLN
INTRAOCULAR | Status: DC | PRN
  Administered 2017-06-22: 15 mL via INTRAOCULAR

## 2017-06-22 MED ORDER — PHENYLEPHRINE HCL 10 MG/ML IJ SOLN: INTRAMUSCULAR | Status: DC | PRN

## 2017-06-22 MED ORDER — TROPICAMIDE 1 % OP SOLN
1.0000 [drp] | OPHTHALMIC | Status: AC | PRN
  Administered 2017-06-22: 1 [drp] via OPHTHALMIC
  Filled 2017-06-22: qty 15

## 2017-06-22 MED ORDER — ONDANSETRON HCL 4 MG/2ML IJ SOLN
INTRAMUSCULAR | Status: AC
  Filled 2017-06-22: qty 2

## 2017-06-22 MED ORDER — PROPOFOL 10 MG/ML IV BOLUS
INTRAVENOUS | Status: DC | PRN
  Administered 2017-06-22: 130 mg via INTRAVENOUS

## 2017-06-22 MED ORDER — PHENYLEPHRINE HCL 2.5 % OP SOLN
1.0000 [drp] | OPHTHALMIC | Status: AC | PRN
  Administered 2017-06-22: 1 [drp] via OPHTHALMIC
  Filled 2017-06-22: qty 2

## 2017-06-22 MED ORDER — MORPHINE SULFATE (PF) 4 MG/ML IV SOLN: 1.0000 mg | INTRAVENOUS | Status: DC | PRN

## 2017-06-22 MED ORDER — BUPIVACAINE HCL (PF) 0.75 % IJ SOLN
INTRAMUSCULAR | Status: AC
  Filled 2017-06-22: qty 10

## 2017-06-22 MED ORDER — FENTANYL CITRATE (PF) 100 MCG/2ML IJ SOLN: 25.0000 ug | INTRAMUSCULAR | Status: DC | PRN

## 2017-06-22 MED ORDER — BACITRACIN-POLYMYXIN B 500-10000 UNIT/GM OP OINT
TOPICAL_OINTMENT | OPHTHALMIC | Status: AC
  Filled 2017-06-22: qty 3.5

## 2017-06-22 MED ORDER — BACITRACIN-POLYMYXIN B 500-10000 UNIT/GM OP OINT
TOPICAL_OINTMENT | OPHTHALMIC | Status: DC | PRN
  Administered 2017-06-22: 1 via OPHTHALMIC

## 2017-06-22 MED ORDER — HYDROCODONE-ACETAMINOPHEN 5-325 MG PO TABS: 1.0000 | ORAL_TABLET | ORAL | Status: DC | PRN

## 2017-06-22 MED ORDER — ATROPINE SULFATE 1 % OP SOLN
OPHTHALMIC | Status: AC
  Filled 2017-06-22: qty 5

## 2017-06-22 MED ORDER — SODIUM CHLORIDE 0.45 % IV SOLN
INTRAVENOUS | Status: DC
  Administered 2017-06-22: 16:00:00 via INTRAVENOUS

## 2017-06-22 MED ORDER — BSS PLUS IO SOLN
INTRAOCULAR | Status: DC | PRN
  Administered 2017-06-22: 1 via INTRAOCULAR

## 2017-06-22 MED ORDER — LATANOPROST 0.005 % OP SOLN
1.0000 [drp] | Freq: Every day | OPHTHALMIC | Status: DC
  Filled 2017-06-22 (×2): qty 2.5

## 2017-06-22 MED ORDER — FENTANYL CITRATE (PF) 100 MCG/2ML IJ SOLN
INTRAMUSCULAR | Status: DC | PRN
  Administered 2017-06-22: 100 ug via INTRAVENOUS

## 2017-06-22 MED ORDER — HYPROMELLOSE (GONIOSCOPIC) 2.5 % OP SOLN
OPHTHALMIC | Status: AC
  Filled 2017-06-22: qty 15

## 2017-06-22 MED ORDER — SUGAMMADEX SODIUM 200 MG/2ML IV SOLN
INTRAVENOUS | Status: DC | PRN
  Administered 2017-06-22: 200 mg via INTRAVENOUS

## 2017-06-22 MED ORDER — SODIUM CHLORIDE 0.9 % IV SOLN
INTRAVENOUS | Status: DC
  Administered 2017-06-22 (×3): via INTRAVENOUS

## 2017-06-22 MED ORDER — LIDOCAINE 2% (20 MG/ML) 5 ML SYRINGE
INTRAMUSCULAR | Status: AC
  Filled 2017-06-22: qty 5

## 2017-06-22 MED ORDER — LIDOCAINE HCL 2 % IJ SOLN
INTRAMUSCULAR | Status: AC
  Filled 2017-06-22: qty 20

## 2017-06-22 MED ORDER — ACETAMINOPHEN 325 MG PO TABS
325.0000 mg | ORAL_TABLET | ORAL | Status: DC | PRN
  Administered 2017-06-23: 650 mg via ORAL
  Filled 2017-06-22: qty 2

## 2017-06-22 MED ORDER — GATIFLOXACIN 0.5 % OP SOLN
1.0000 [drp] | OPHTHALMIC | Status: AC | PRN
  Administered 2017-06-22: 1 [drp] via OPHTHALMIC
  Filled 2017-06-22: qty 2.5

## 2017-06-22 MED ORDER — GATIFLOXACIN 0.5 % OP SOLN
1.0000 [drp] | Freq: Four times a day (QID) | OPHTHALMIC | Status: DC
  Filled 2017-06-22: qty 2.5

## 2017-06-22 SURGICAL SUPPLY — 69 items
BALL CTTN LRG ABS STRL LF (GAUZE/BANDAGES/DRESSINGS) ×3
BLADE EYE CATARACT 19 1.4 BEAV (BLADE) IMPLANT
BLADE MVR KNIFE 19G (BLADE) IMPLANT
BLADE MVR KNIFE 20G (BLADE) ×2 IMPLANT
CANNULA VLV SOFT TIP 25G (OPHTHALMIC) ×1 IMPLANT
CANNULA VLV SOFT TIP 25GA (OPHTHALMIC) ×4 IMPLANT
CORD BIPOLAR FORCEPS 12FT (ELECTRODE) ×1 IMPLANT
CORDS BIPOLAR (ELECTRODE) ×1 IMPLANT
COTTONBALL LRG STERILE PKG (GAUZE/BANDAGES/DRESSINGS) ×6 IMPLANT
COVER MAYO STAND STRL (DRAPES) IMPLANT
DRAPE INCISE 51X51 W/FILM STRL (DRAPES) ×1 IMPLANT
DRAPE OPHTHALMIC 77X100 STRL (CUSTOM PROCEDURE TRAY) ×2 IMPLANT
ERASER HMR WETFIELD 23G BP (MISCELLANEOUS) ×2 IMPLANT
FILTER BLUE MILLIPORE (MISCELLANEOUS) IMPLANT
FILTER STRAW FLUID ASPIR (MISCELLANEOUS) ×3 IMPLANT
FORCEPS GRIESHABER ILM 25G A (INSTRUMENTS) IMPLANT
GAS AUTO FILL CONSTEL (OPHTHALMIC) ×2
GAS AUTO FILL CONSTELLATION (OPHTHALMIC) ×1 IMPLANT
GLOVE SS BIOGEL STRL SZ 6.5 (GLOVE) ×1 IMPLANT
GLOVE SS BIOGEL STRL SZ 7 (GLOVE) ×1 IMPLANT
GLOVE SUPERSENSE BIOGEL SZ 6.5 (GLOVE) ×1
GLOVE SUPERSENSE BIOGEL SZ 7 (GLOVE) ×1
GLOVE SURG 8.5 LATEX PF (GLOVE) ×2 IMPLANT
GOWN STRL REUS W/ TWL LRG LVL3 (GOWN DISPOSABLE) ×3 IMPLANT
GOWN STRL REUS W/TWL LRG LVL3 (GOWN DISPOSABLE) ×6
HANDLE PNEUMATIC FOR CONSTEL (OPHTHALMIC) IMPLANT
KIT BASIN OR (CUSTOM PROCEDURE TRAY) ×2 IMPLANT
KNIFE GRIESHABER SHARP 2.5MM (MISCELLANEOUS) IMPLANT
MICROPICK 25G (MISCELLANEOUS)
NDL 18GX1X1/2 (RX/OR ONLY) (NEEDLE) ×1 IMPLANT
NDL 25GX 5/8IN NON SAFETY (NEEDLE) ×1 IMPLANT
NDL FILTER BLUNT 18X1 1/2 (NEEDLE) IMPLANT
NDL HYPO 30X.5 LL (NEEDLE) IMPLANT
NEEDLE 18GX1X1/2 (RX/OR ONLY) (NEEDLE) ×2 IMPLANT
NEEDLE 25GX 5/8IN NON SAFETY (NEEDLE) ×2 IMPLANT
NEEDLE FILTER BLUNT 18X 1/2SAF (NEEDLE)
NEEDLE FILTER BLUNT 18X1 1/2 (NEEDLE) IMPLANT
NEEDLE HYPO 30X.5 LL (NEEDLE) IMPLANT
NS IRRIG 1000ML POUR BTL (IV SOLUTION) ×2 IMPLANT
PACK FRAGMATOME (OPHTHALMIC) IMPLANT
PACK VITRECTOMY CUSTOM (CUSTOM PROCEDURE TRAY) ×2 IMPLANT
PAD ARMBOARD 7.5X6 YLW CONV (MISCELLANEOUS) ×4 IMPLANT
PAK PIK VITRECTOMY CVS 25GA (OPHTHALMIC) ×2 IMPLANT
PIC ILLUMINATED 25G (OPHTHALMIC) ×2
PICK MICROPICK 25G (MISCELLANEOUS) IMPLANT
PIK ILLUMINATED 25G (OPHTHALMIC) ×1 IMPLANT
PROBE LASER ILLUM FLEX CVD 25G (OPHTHALMIC) IMPLANT
REPL STRA BRUSH NDL (NEEDLE) ×1 IMPLANT
REPL STRA BRUSH NEEDLE (NEEDLE) ×2 IMPLANT
RESERVOIR BACK FLUSH (MISCELLANEOUS) ×2 IMPLANT
ROLLS DENTAL (MISCELLANEOUS) ×4 IMPLANT
SCRAPER DIAMOND 25GA (OPHTHALMIC RELATED) ×1 IMPLANT
SCRAPER DIAMOND DUST MEMBRANE (MISCELLANEOUS) ×2 IMPLANT
SPONGE SURGIFOAM ABS GEL 12-7 (HEMOSTASIS) ×2 IMPLANT
STOPCOCK 4 WAY LG BORE MALE ST (IV SETS) IMPLANT
SUT CHROMIC 7 0 TG140 8 (SUTURE) IMPLANT
SUT ETHILON 10 0 CS140 6 (SUTURE) ×1 IMPLANT
SUT ETHILON 9 0 TG140 8 (SUTURE) ×2 IMPLANT
SUT POLY NON ABSORB 10-0 8 STR (SUTURE) ×1 IMPLANT
SUT SILK 4 0 RB 1 (SUTURE) IMPLANT
SUT VICRYL 7 0 TG140 8 (SUTURE) ×1 IMPLANT
SYR 10ML LL (SYRINGE) IMPLANT
SYR 20CC LL (SYRINGE) ×2 IMPLANT
SYR 5ML LL (SYRINGE) IMPLANT
SYR BULB 3OZ (MISCELLANEOUS) ×2 IMPLANT
SYR TB 1ML LUER SLIP (SYRINGE) ×3 IMPLANT
TUBING HIGH PRESS EXTEN 6IN (TUBING) IMPLANT
WATER STERILE IRR 1000ML POUR (IV SOLUTION) ×2 IMPLANT
WIPE INSTRUMENT VISIWIPE 73X73 (MISCELLANEOUS) ×1 IMPLANT

## 2017-06-22 NOTE — Anesthesia Procedure Notes (Signed)
Procedure Name: Intubation Date/Time: 06/22/2017 11:41 AM Performed by: White, Amedeo Plenty, CRNA Pre-anesthesia Checklist: Patient identified, Emergency Drugs available, Suction available and Patient being monitored Patient Re-evaluated:Patient Re-evaluated prior to induction Oxygen Delivery Method: Circle System Utilized Preoxygenation: Pre-oxygenation with 100% oxygen Induction Type: IV induction Ventilation: Mask ventilation without difficulty Laryngoscope Size: Mac and 3 Grade View: Grade I Tube type: Oral Tube size: 7.0 mm Number of attempts: 1 Airway Equipment and Method: Stylet Placement Confirmation: ETT inserted through vocal cords under direct vision,  positive ETCO2 and breath sounds checked- equal and bilateral Secured at: 22 cm Tube secured with: Tape Dental Injury: Teeth and Oropharynx as per pre-operative assessment

## 2017-06-22 NOTE — Transfer of Care (Signed)
Immediate Anesthesia Transfer of Care Note  Patient: Margaret Gilmore  Procedure(s) Performed: 25 GAUGE PARS PLANA VITRECTOMY WITH 20 GAUGE MVR PORT FOR MACULAR HOLE (Right Eye) GAS/FLUID EXCHANGE (Right Eye) LASER PHOTO ABLATION (Right Eye) SERUM PATCH (Right Eye) MEMBRANE PEEL (Right Eye)  Patient Location: PACU  Anesthesia Type:General  Level of Consciousness: awake, alert  and patient cooperative  Airway & Oxygen Therapy: Patient Spontanous Breathing  Post-op Assessment: Report given to RN and Post -op Vital signs reviewed and stable  Post vital signs: Reviewed and stable  Last Vitals:  Vitals:   06/22/17 0926 06/22/17 1315  BP: 130/85   Pulse: 69   Resp: 18   Temp: 36.9 C 36.6 C  SpO2: 100%     Last Pain:  Vitals:   06/22/17 1315  TempSrc:   PainSc: 0-No pain      Patients Stated Pain Goal: 2 (45/85/92 [id number])  Complications: No apparent anesthesia complications

## 2017-06-22 NOTE — Anesthesia Postprocedure Evaluation (Signed)
Anesthesia Post Note  Patient: Margaret Gilmore  Procedure(s) Performed: 25 GAUGE PARS PLANA VITRECTOMY WITH 20 GAUGE MVR PORT FOR MACULAR HOLE (Right Eye) GAS/FLUID EXCHANGE (Right Eye) LASER PHOTO ABLATION (Right Eye) SERUM PATCH (Right Eye) MEMBRANE PEEL (Right Eye)     Patient location during evaluation: PACU Anesthesia Type: General Level of consciousness: awake and alert Pain management: pain level controlled Vital Signs Assessment: post-procedure vital signs reviewed and stable Respiratory status: spontaneous breathing, nonlabored ventilation, respiratory function stable and patient connected to nasal cannula oxygen Cardiovascular status: blood pressure returned to baseline and stable Postop Assessment: no apparent nausea or vomiting Anesthetic complications: no    Last Vitals:  Vitals:   06/22/17 1344 06/22/17 1345  BP: 109/69   Pulse: 79 83  Resp: 15 15  Temp:    SpO2: 95% 95%    Last Pain:  Vitals:   06/22/17 1345  TempSrc:   PainSc: 0-No pain                 Montez Hageman

## 2017-06-22 NOTE — H&P (Signed)
I examined the patient today and there is no change in the medical status 

## 2017-06-22 NOTE — Brief Op Note (Signed)
Brief Operative note   Preoperative diagnosis:  macular hole right eye Postoperative diagnosis  Same.  Posterior vitreous membranes   Procedures: Pars plana vitrectomy, membrane peel, ILM peel, laser, serum patch, gas injection right eye  Surgeon:  [name], MD...  Assistant:  Deatra Ina SA  Anesthesia: General  Specimen: none  Estimated blood loss:  1cc  Complications: none  Patient sent to PACU in good condition  Composed by [name] MD  Dictation number: [phone]

## 2017-06-22 NOTE — Progress Notes (Signed)
Green Gas Bracelet put on patients right wrist

## 2017-06-22 NOTE — Anesthesia Preprocedure Evaluation (Signed)
Anesthesia Evaluation  Patient identified by MRN, date of birth, ID band Patient awake    Reviewed: Allergy & Precautions, NPO status , Patient's Chart, lab work & pertinent test results  Airway Mallampati: II  TM Distance: >3 FB Neck ROM: Full    Dental no notable dental hx.    Pulmonary neg pulmonary ROS,    Pulmonary exam normal breath sounds clear to auscultation       Cardiovascular negative cardio ROS Normal cardiovascular exam Rhythm:Regular Rate:Normal     Neuro/Psych negative neurological ROS  negative psych ROS   GI/Hepatic Neg liver ROS, GERD  Medicated and Controlled,  Endo/Other  negative endocrine ROS  Renal/GU negative Renal ROS  negative genitourinary   Musculoskeletal negative musculoskeletal ROS (+)   Abdominal   Peds negative pediatric ROS (+)  Hematology negative hematology ROS (+)   Anesthesia Other Findings   Reproductive/Obstetrics negative OB ROS                             Anesthesia Physical Anesthesia Plan  ASA: II  Anesthesia Plan: General   Post-op Pain Management:    Induction: Intravenous  PONV Risk Score and Plan: 3 and Ondansetron, Treatment may vary due to age or medical condition and Midazolam  Airway Management Planned: Oral ETT  Additional Equipment:   Intra-op Plan:   Post-operative Plan: Extubation in OR  Informed Consent: I have reviewed the patients History and Physical, chart, labs and discussed the procedure including the risks, benefits and alternatives for the proposed anesthesia with the patient or authorized representative who has indicated his/her understanding and acceptance.   Dental advisory given  Plan Discussed with: CRNA  Anesthesia Plan Comments:         Anesthesia Quick Evaluation

## 2017-06-23 ENCOUNTER — Encounter (HOSPITAL_COMMUNITY): Payer: Self-pay | Admitting: Ophthalmology

## 2017-06-23 DIAGNOSIS — H35341 Macular cyst, hole, or pseudohole, right eye: Secondary | ICD-10-CM | POA: Diagnosis not present

## 2017-06-23 MED ORDER — PREDNISOLONE ACETATE 1 % OP SUSP: 1.0000 [drp] | Freq: Four times a day (QID) | OPHTHALMIC | 0 refills | Status: DC

## 2017-06-23 MED ORDER — GATIFLOXACIN 0.5 % OP SOLN: 1.0000 [drp] | Freq: Four times a day (QID) | OPHTHALMIC | Status: DC

## 2017-06-23 MED ORDER — BACITRACIN-POLYMYXIN B 500-10000 UNIT/GM OP OINT: 1.0000 "application " | TOPICAL_OINTMENT | Freq: Three times a day (TID) | OPHTHALMIC | 0 refills | Status: DC

## 2017-06-23 NOTE — Discharge Summary (Signed)
Discharge summary not needed on [facility] patients per medical records. 

## 2017-06-23 NOTE — Progress Notes (Signed)
06/23/2017, 6:21 AM  Mental Status:  Awake, Alert, Oriented  Anterior segment: Cornea  Clear    Anterior Chamber Clear    Lens:   Cataract  Intra Ocular Pressure 18 mmHg with Tonopen  Vitreous: Clear 95%gas bubble   Retina:  Attached Good laser reaction   Impression: Excellent result Retina attached  Final Diagnosis: Active Problems:   Macular hole, right eye   Plan: start post operative eye drops.  Discharge to home.  Give post operative instructions  Margaret Gilmore 06/23/2017, 6:21 AM

## 2017-06-23 NOTE — Op Note (Signed)
Margaret Gilmore, Margaret Gilmore               ACCOUNT NO.:  [id number]  MEDICAL RECORD NO.:  [id number]  LOCATION:                                 FACILITY:  PHYSICIAN:  Sanoe Hazan D. Zigmund Daniel, M.D.      DATE OF BIRTH:  DATE OF PROCEDURE:  06/22/2017 DATE OF DISCHARGE:                              OPERATIVE REPORT   ADMISSION DIAGNOSIS:  Macular hole, right eye.  PROCEDURES:  Pars plana vitrectomy, membrane peel, ILM peel, retinal photocoagulation, serum patch, C3F8 14%, vitreous injection, right eye.  SURGEON:  Chrystie Nose. Zigmund Daniel, M.D.  ASSISTANT:  Deatra Ina, SA.  ANESTHESIA:  General.  DETAILS:  Usual prep and drape, the indirect ophthalmoscope laser was moved into place.  1130 burns were placed around the retinal periphery and the far periphery in weak areas of the retina.  A retinal break was seen at 12 o'clock.  This was carefully surrounded with laser.  The power was between 420 mW and 500 mW, 1000 microns each, 0.1 seconds each.  Attention was then carried to the pars plana area where a conjunctival incision was made at 2 o'clock.  The sclera was diathermized and a 3-layered intrascleral wound was created with MVR incision into the vitreous cavity.  A 20-gauge incision was made.  A 25- gauge trocars placed at 8 o'clock and 10 o'clock.  Infusion at 8 o'clock.  Contact lens ring anchored into place at 6 and 9 o'clock. Provisc placed on the corneal surface and the flat contact lens was placed.  Pars plana vitrectomy was begun just behind the cataractous lens.  A large cleft of cortical spoke was seen at 12 o'clock.  The vitrectomy was carried down to the macular surface where additional posterior membranes were seen.  Posterior membranes were rippling along the retinal surface.  All vitreous membranes were removed from the macular region in a core fashion.  The silicone tip suction line was brought into the eye and drawn down toward the macula.  The fish-strike sign occurred.  The  operculum from the macular hole lifted up into the vitreous cavity.  It was removed with a vitreous cutter.  The edges of the hole were teased with the diamond-dusted membrane scraper.  All attachments of the vitreous to the edge of the hole were removed with the diamond-dusted membrane scraper and the vitreous cutter.  The vitrectomy was carried into the mid periphery where additional membranes were encountered.  They were attachments to the retina and these attachments were broken and trimmed.  The vitrectomy was carried into the far periphery with the 30 degree prismatic lens for viewing.  The vitrectomy was carried for 360 degrees down to the vitreous base.  Once this was accomplished, the magnifying contact lens was placed onto the cornea into the ring with a layer of methylcellulose.  The attention was carried to the macular hole region.  The diamond-dusted membrane scraper was used to engage the internal limiting membrane for 360 degrees around the hole.  The membrane was elevated and removed with a vitreous cutter. The edges of the hole were somewhat serrated.  Once the hole was totally freed and there were no attachments of vitreous  to the hole or ILM to the hole, a total gas fluid exchange was carried out.  Fluid was removed with a [place] Ophthalmic brush until the posterior segment was dry.  The serum patch was prepared during this time and the C3F8 was prepared to a 14% concentration.  Additional time was allowed for the fluid to track down the walls of the eye and collect in the posterior segment.  This fluid was removed with the [place] Ophthalmic brush.  The serum patch was delivered and additional fluid was removed with the [place] Ophthalmic brush.  C3F8 14% was exchanged for intravitreal gas.  A 100% exchange was accomplished.  The scleral wound was closed with a single 9-0 nylon suture.  The conjunctiva was closed with wet-field cautery.  25-gauge trocars were removed.  The  wounds were tested and found to be secure. Polymyxin and ceftazidime were rinsed around the globe for antibiotic coverage.  Decadron 10 mg was injected to the lower subconjunctival space.  Atropine solution was applied.  Marcaine was injected around the globe for postop pain.  The closing pressure was 10 with a [place] tonometer.  COMPLICATIONS:  None.  DURATION:  1 hour 30 minutes.  The patient was awakened and taken to recovery in satisfactory condition.     Chrystie Nose. Zigmund Daniel, M.D.     JDM/MEDQ  D:  06/22/2017  T:  06/22/2017  Job:  [id number]

## 2017-06-29 ENCOUNTER — Encounter (INDEPENDENT_AMBULATORY_CARE_PROVIDER_SITE_OTHER): Payer: BLUE CROSS/BLUE SHIELD | Admitting: Ophthalmology

## 2017-06-29 DIAGNOSIS — H35341 Macular cyst, hole, or pseudohole, right eye: Secondary | ICD-10-CM

## 2017-06-30 ENCOUNTER — Encounter (INDEPENDENT_AMBULATORY_CARE_PROVIDER_SITE_OTHER): Payer: BLUE CROSS/BLUE SHIELD | Admitting: Ophthalmology

## 2017-07-19 ENCOUNTER — Encounter (INDEPENDENT_AMBULATORY_CARE_PROVIDER_SITE_OTHER): Payer: BLUE CROSS/BLUE SHIELD | Admitting: Ophthalmology

## 2017-07-19 DIAGNOSIS — H338 Other retinal detachments: Secondary | ICD-10-CM

## 2017-07-19 DIAGNOSIS — H35341 Macular cyst, hole, or pseudohole, right eye: Secondary | ICD-10-CM | POA: Diagnosis not present

## 2017-07-23 ENCOUNTER — Other Ambulatory Visit: Payer: Self-pay

## 2017-07-23 ENCOUNTER — Encounter (HOSPITAL_COMMUNITY): Payer: Self-pay | Admitting: *Deleted

## 2017-07-23 NOTE — Progress Notes (Signed)
Pt denies SOB, chest pain, and being under the care of a cardiologist. Pt denies having a stress test, echo and cardiac cath. Pt denies having a chest x ray within the last year. Pt stated that MD advised that she stop taking Aspirin and  " Aspirin- like products. "  Pt made aware to stop taking vitamins, fish oil and herbal medications. Pt verbalized understanding of all pre-op instructions.

## 2017-07-27 ENCOUNTER — Ambulatory Visit (HOSPITAL_COMMUNITY): Payer: BLUE CROSS/BLUE SHIELD | Admitting: Anesthesiology

## 2017-07-27 ENCOUNTER — Ambulatory Visit (HOSPITAL_COMMUNITY)
Admission: RE | Admit: 2017-07-27 | Discharge: 2017-07-28 | Disposition: A | Discharge status: Home / Self Care | Payer: BLUE CROSS/BLUE SHIELD | Source: Ambulatory Visit | Attending: Ophthalmology | Admitting: Ophthalmology

## 2017-07-27 ENCOUNTER — Encounter (HOSPITAL_COMMUNITY): Payer: Self-pay

## 2017-07-27 ENCOUNTER — Encounter (HOSPITAL_COMMUNITY)
Admission: RE | Disposition: A | Discharge status: Home / Self Care | Payer: Self-pay | Source: Ambulatory Visit | Attending: Ophthalmology

## 2017-07-27 ENCOUNTER — Other Ambulatory Visit: Payer: Self-pay

## 2017-07-27 DIAGNOSIS — Z882 Allergy status to sulfonamides status: Secondary | ICD-10-CM | POA: Diagnosis not present

## 2017-07-27 DIAGNOSIS — Z79899 Other long term (current) drug therapy: Secondary | ICD-10-CM | POA: Diagnosis not present

## 2017-07-27 DIAGNOSIS — Z7982 Long term (current) use of aspirin: Secondary | ICD-10-CM | POA: Diagnosis not present

## 2017-07-27 DIAGNOSIS — Z888 Allergy status to other drugs, medicaments and biological substances status: Secondary | ICD-10-CM | POA: Diagnosis not present

## 2017-07-27 DIAGNOSIS — H33001 Unspecified retinal detachment with retinal break, right eye: Secondary | ICD-10-CM | POA: Diagnosis present

## 2017-07-27 DIAGNOSIS — H33021 Retinal detachment with multiple breaks, right eye: Secondary | ICD-10-CM | POA: Diagnosis not present

## 2017-07-27 DIAGNOSIS — K219 Gastro-esophageal reflux disease without esophagitis: Secondary | ICD-10-CM | POA: Insufficient documentation

## 2017-07-27 DIAGNOSIS — Z91012 Allergy to eggs: Secondary | ICD-10-CM | POA: Insufficient documentation

## 2017-07-27 DIAGNOSIS — H35341 Macular cyst, hole, or pseudohole, right eye: Secondary | ICD-10-CM | POA: Diagnosis not present

## 2017-07-27 DIAGNOSIS — Z7952 Long term (current) use of systemic steroids: Secondary | ICD-10-CM | POA: Diagnosis not present

## 2017-07-27 DIAGNOSIS — H338 Other retinal detachments: Secondary | ICD-10-CM | POA: Diagnosis not present

## 2017-07-27 HISTORY — PX: SCLERAL BUCKLE WITH POSSIBLE 25 GAUGE PARS PLANA VITRECTOMY: SHX6206

## 2017-07-27 HISTORY — PX: MEMBRANE PEEL: SHX5967

## 2017-07-27 HISTORY — PX: GAS/FLUID EXCHANGE: SHX5334

## 2017-07-27 HISTORY — DX: Retinal detachment with multiple breaks, right eye: H33.021

## 2017-07-27 HISTORY — PX: LASER PHOTO ABLATION: SHX5942

## 2017-07-27 LAB — CBC
HCT: 40 % (ref 36.0–46.0)
Hemoglobin: 12.9 g/dL (ref 12.0–15.0)
MCH: 28.7 pg (ref 26.0–34.0)
MCHC: 32.3 g/dL (ref 30.0–36.0)
MCV: 89.1 fL (ref 78.0–100.0)
Platelets: 328 10*3/uL (ref 150–400)
RBC: 4.49 MIL/uL (ref 3.87–5.11)
RDW: 13.7 % (ref 11.5–15.5)
WBC: 6.1 10*3/uL (ref 4.0–10.5)

## 2017-07-27 LAB — AUTOLOGOUS SERUM PATCH PREP

## 2017-07-27 SURGERY — SCLERAL BUCKLE WITH POSSIBLE 25 GAUGE PARS PLANA VITRECTOMY
Anesthesia: General | Site: Eye | Laterality: Right

## 2017-07-27 MED ORDER — DORZOLAMIDE HCL 2 % OP SOLN
1.0000 [drp] | Freq: Three times a day (TID) | OPHTHALMIC | Status: DC
  Filled 2017-07-27: qty 10

## 2017-07-27 MED ORDER — SUGAMMADEX SODIUM 200 MG/2ML IV SOLN
INTRAVENOUS | Status: AC
  Filled 2017-07-27: qty 2

## 2017-07-27 MED ORDER — TEMAZEPAM 15 MG PO CAPS: 15.0000 mg | ORAL_CAPSULE | Freq: Every evening | ORAL | Status: DC | PRN

## 2017-07-27 MED ORDER — BACITRACIN-POLYMYXIN B 500-10000 UNIT/GM OP OINT
1.0000 "application " | TOPICAL_OINTMENT | Freq: Three times a day (TID) | OPHTHALMIC | Status: DC
  Filled 2017-07-27: qty 3.5

## 2017-07-27 MED ORDER — PANTOPRAZOLE SODIUM 40 MG PO TBEC: 40.0000 mg | DELAYED_RELEASE_TABLET | Freq: Every day | ORAL | Status: DC | PRN

## 2017-07-27 MED ORDER — STERILE WATER FOR INJECTION IJ SOLN
INTRAMUSCULAR | Status: DC | PRN
  Administered 2017-07-27: 10 mL

## 2017-07-27 MED ORDER — ONDANSETRON HCL 4 MG/2ML IJ SOLN: 4.0000 mg | Freq: Four times a day (QID) | INTRAMUSCULAR | Status: DC | PRN

## 2017-07-27 MED ORDER — ROCURONIUM BROMIDE 50 MG/5ML IV SOLN
INTRAVENOUS | Status: AC
  Filled 2017-07-27: qty 1

## 2017-07-27 MED ORDER — HYALURONIDASE HUMAN 150 UNIT/ML IJ SOLN
INTRAMUSCULAR | Status: AC
  Filled 2017-07-27: qty 1

## 2017-07-27 MED ORDER — PHENYLEPHRINE HCL 2.5 % OP SOLN
1.0000 [drp] | OPHTHALMIC | Status: AC | PRN
  Administered 2017-07-27 (×3): 1 [drp] via OPHTHALMIC
  Filled 2017-07-27: qty 2

## 2017-07-27 MED ORDER — CYCLOPENTOLATE HCL 1 % OP SOLN: 1.0000 [drp] | OPHTHALMIC | Status: DC | PRN

## 2017-07-27 MED ORDER — BSS PLUS IO SOLN
INTRAOCULAR | Status: AC
  Filled 2017-07-27: qty 500

## 2017-07-27 MED ORDER — BRIMONIDINE TARTRATE 0.15 % OP SOLN
1.0000 [drp] | Freq: Two times a day (BID) | OPHTHALMIC | Status: DC
  Administered 2017-07-27: 1 [drp] via OPHTHALMIC
  Filled 2017-07-27: qty 5

## 2017-07-27 MED ORDER — GATIFLOXACIN 0.5 % OP SOLN
1.0000 [drp] | Freq: Four times a day (QID) | OPHTHALMIC | Status: DC
  Filled 2017-07-27: qty 2.5

## 2017-07-27 MED ORDER — EPHEDRINE 5 MG/ML INJ
INTRAVENOUS | Status: AC
  Filled 2017-07-27: qty 20

## 2017-07-27 MED ORDER — FENTANYL CITRATE (PF) 250 MCG/5ML IJ SOLN
INTRAMUSCULAR | Status: DC | PRN
  Administered 2017-07-27: 100 ug via INTRAVENOUS

## 2017-07-27 MED ORDER — STERILE WATER FOR INJECTION IJ SOLN
INTRAMUSCULAR | Status: DC | PRN
  Administered 2017-07-27: 20 mL

## 2017-07-27 MED ORDER — OXYCODONE HCL 5 MG PO TABS: 5.0000 mg | ORAL_TABLET | Freq: Once | ORAL | Status: DC | PRN

## 2017-07-27 MED ORDER — PHENYLEPHRINE HCL 2.5 % OP SOLN: 1.0000 [drp] | OPHTHALMIC | Status: DC | PRN

## 2017-07-27 MED ORDER — CYCLOPENTOLATE HCL 1 % OP SOLN
1.0000 [drp] | OPHTHALMIC | Status: AC | PRN
  Administered 2017-07-27 (×3): 1 [drp] via OPHTHALMIC
  Filled 2017-07-27: qty 2

## 2017-07-27 MED ORDER — PROPOFOL 10 MG/ML IV BOLUS
INTRAVENOUS | Status: AC
  Filled 2017-07-27: qty 20

## 2017-07-27 MED ORDER — GATIFLOXACIN 0.5 % OP SOLN: 1.0000 [drp] | OPHTHALMIC | Status: DC | PRN

## 2017-07-27 MED ORDER — TROPICAMIDE 1 % OP SOLN
1.0000 [drp] | OPHTHALMIC | Status: AC | PRN
  Administered 2017-07-27 (×3): 1 [drp] via OPHTHALMIC
  Filled 2017-07-27: qty 15

## 2017-07-27 MED ORDER — BACITRACIN-POLYMYXIN B 500-10000 UNIT/GM OP OINT
TOPICAL_OINTMENT | OPHTHALMIC | Status: AC
  Filled 2017-07-27: qty 3.5

## 2017-07-27 MED ORDER — PREDNISOLONE ACETATE 1 % OP SUSP: 1.0000 [drp] | Freq: Four times a day (QID) | OPHTHALMIC | Status: DC

## 2017-07-27 MED ORDER — HYPROMELLOSE (GONIOSCOPIC) 2.5 % OP SOLN
1.0000 [drp] | Freq: Four times a day (QID) | OPHTHALMIC | Status: DC
  Administered 2017-07-27: 1 [drp] via OPHTHALMIC
  Filled 2017-07-27: qty 15

## 2017-07-27 MED ORDER — DEXAMETHASONE SODIUM PHOSPHATE 10 MG/ML IJ SOLN
INTRAMUSCULAR | Status: AC
  Filled 2017-07-27: qty 1

## 2017-07-27 MED ORDER — ATROPINE SULFATE 1 % OP SOLN
OPHTHALMIC | Status: DC | PRN
  Administered 2017-07-27: 1 [drp] via OPHTHALMIC

## 2017-07-27 MED ORDER — ROCURONIUM BROMIDE 10 MG/ML (PF) SYRINGE
PREFILLED_SYRINGE | INTRAVENOUS | Status: DC | PRN
  Administered 2017-07-27: 40 mg via INTRAVENOUS
  Administered 2017-07-27 (×4): 10 mg via INTRAVENOUS

## 2017-07-27 MED ORDER — SODIUM HYALURONATE 10 MG/ML IO SOLN
INTRAOCULAR | Status: AC
  Filled 2017-07-27: qty 0.85

## 2017-07-27 MED ORDER — ACETAZOLAMIDE SODIUM 500 MG IJ SOLR
INTRAMUSCULAR | Status: AC
  Filled 2017-07-27: qty 500

## 2017-07-27 MED ORDER — BSS IO SOLN
INTRAOCULAR | Status: AC
  Filled 2017-07-27: qty 15

## 2017-07-27 MED ORDER — MORPHINE SULFATE (PF) 4 MG/ML IV SOLN: 1.0000 mg | INTRAVENOUS | Status: DC | PRN

## 2017-07-27 MED ORDER — HEMOSTATIC AGENTS (NO CHARGE) OPTIME
TOPICAL | Status: DC | PRN
  Administered 2017-07-27: 1

## 2017-07-27 MED ORDER — ACETAMINOPHEN 500 MG PO TABS: 1000.0000 mg | ORAL_TABLET | Freq: Four times a day (QID) | ORAL | Status: DC | PRN

## 2017-07-27 MED ORDER — BSS IO SOLN
INTRAOCULAR | Status: DC | PRN
  Administered 2017-07-27: 15 mL via INTRAOCULAR

## 2017-07-27 MED ORDER — EPINEPHRINE PF 1 MG/ML IJ SOLN
INTRAMUSCULAR | Status: AC
  Filled 2017-07-27: qty 1

## 2017-07-27 MED ORDER — ACETAMINOPHEN 325 MG PO TABS
325.0000 mg | ORAL_TABLET | ORAL | Status: DC | PRN
  Administered 2017-07-27: 650 mg via ORAL
  Filled 2017-07-27: qty 2

## 2017-07-27 MED ORDER — ASPIRIN-ACETAMINOPHEN-CAFFEINE 250-250-65 MG PO TABS
1.0000 | ORAL_TABLET | Freq: Four times a day (QID) | ORAL | Status: DC | PRN
  Filled 2017-07-27: qty 1

## 2017-07-27 MED ORDER — LIDOCAINE HCL 2 % IJ SOLN
INTRAMUSCULAR | Status: DC | PRN
  Administered 2017-07-27: 10 mL

## 2017-07-27 MED ORDER — CEFTAZIDIME 1 G IJ SOLR
INTRAMUSCULAR | Status: AC
  Filled 2017-07-27: qty 1

## 2017-07-27 MED ORDER — ATROPINE SULFATE 1 % OP SOLN
OPHTHALMIC | Status: AC
  Filled 2017-07-27: qty 5

## 2017-07-27 MED ORDER — SODIUM CHLORIDE 0.9 % IJ SOLN
INTRAMUSCULAR | Status: AC
  Filled 2017-07-27: qty 10

## 2017-07-27 MED ORDER — GATIFLOXACIN 0.5 % OP SOLN
1.0000 [drp] | OPHTHALMIC | Status: AC | PRN
  Administered 2017-07-27 (×3): 1 [drp] via OPHTHALMIC
  Filled 2017-07-27: qty 2.5

## 2017-07-27 MED ORDER — BACITRACIN-POLYMYXIN B 500-10000 UNIT/GM OP OINT
TOPICAL_OINTMENT | OPHTHALMIC | Status: DC | PRN
  Administered 2017-07-27: 1 via OPHTHALMIC

## 2017-07-27 MED ORDER — TOBRAMYCIN 0.3 % OP SOLN
1.0000 [drp] | Freq: Four times a day (QID) | OPHTHALMIC | Status: DC
  Administered 2017-07-27 – 2017-07-28 (×3): 1 [drp] via OPHTHALMIC
  Filled 2017-07-27: qty 5

## 2017-07-27 MED ORDER — CEFAZOLIN SODIUM-DEXTROSE 2-4 GM/100ML-% IV SOLN
2.0000 g | INTRAVENOUS | Status: AC
  Administered 2017-07-27: 2 g via INTRAVENOUS
  Filled 2017-07-27: qty 100

## 2017-07-27 MED ORDER — MAGNESIUM HYDROXIDE 400 MG/5ML PO SUSP: 15.0000 mL | Freq: Four times a day (QID) | ORAL | Status: DC | PRN

## 2017-07-27 MED ORDER — LATANOPROST 0.005 % OP SOLN
1.0000 [drp] | Freq: Every day | OPHTHALMIC | Status: DC
  Filled 2017-07-27: qty 2.5

## 2017-07-27 MED ORDER — TETRACAINE HCL 0.5 % OP SOLN
2.0000 [drp] | Freq: Once | OPHTHALMIC | Status: DC
  Filled 2017-07-27: qty 4

## 2017-07-27 MED ORDER — SODIUM CHLORIDE 0.9 % IV SOLN
INTRAVENOUS | Status: DC
  Administered 2017-07-27: 10:00:00 via INTRAVENOUS

## 2017-07-27 MED ORDER — BUPIVACAINE HCL (PF) 0.75 % IJ SOLN
INTRAMUSCULAR | Status: AC
  Filled 2017-07-27: qty 10

## 2017-07-27 MED ORDER — LIDOCAINE HCL (CARDIAC) 20 MG/ML IV SOLN
INTRAVENOUS | Status: AC
  Filled 2017-07-27: qty 10

## 2017-07-27 MED ORDER — ACETAZOLAMIDE SODIUM 500 MG IJ SOLR
500.0000 mg | Freq: Once | INTRAMUSCULAR | Status: AC
  Administered 2017-07-28: 500 mg via INTRAVENOUS
  Filled 2017-07-27: qty 500

## 2017-07-27 MED ORDER — DEXAMETHASONE SODIUM PHOSPHATE 10 MG/ML IJ SOLN
INTRAMUSCULAR | Status: DC | PRN
Start: 2017-07-27 — End: 2017-07-27
  Administered 2017-07-27: 10 mg

## 2017-07-27 MED ORDER — BRIMONIDINE TARTRATE 0.2 % OP SOLN
1.0000 [drp] | Freq: Two times a day (BID) | OPHTHALMIC | Status: DC
  Filled 2017-07-27: qty 5

## 2017-07-27 MED ORDER — NA CHONDROIT SULF-NA HYALURON 40-30 MG/ML IO SOLN
INTRAOCULAR | Status: AC
  Filled 2017-07-27: qty 0.5

## 2017-07-27 MED ORDER — ROCURONIUM BROMIDE 50 MG/5ML IV SOLN
INTRAVENOUS | Status: AC
  Filled 2017-07-27: qty 2

## 2017-07-27 MED ORDER — GLYCOPYRROLATE 0.2 MG/ML IJ SOLN
INTRAMUSCULAR | Status: DC | PRN
  Administered 2017-07-27: 0.2 mg via INTRAVENOUS

## 2017-07-27 MED ORDER — BUPIVACAINE HCL (PF) 0.75 % IJ SOLN
INTRAMUSCULAR | Status: DC | PRN
  Administered 2017-07-27: 10 mL

## 2017-07-27 MED ORDER — TOBRAMYCIN-DEXAMETHASONE 0.3-0.1 % OP OINT
TOPICAL_OINTMENT | OPHTHALMIC | Status: DC | PRN
  Administered 2017-07-27: 1 via OPHTHALMIC

## 2017-07-27 MED ORDER — LIDOCAINE 2% (20 MG/ML) 5 ML SYRINGE
INTRAMUSCULAR | Status: DC | PRN
  Administered 2017-07-27: 60 mg via INTRAVENOUS

## 2017-07-27 MED ORDER — PHENYLEPHRINE HCL 10 MG/ML IJ SOLN
INTRAMUSCULAR | Status: DC | PRN
  Administered 2017-07-27: 30 ug/min via INTRAVENOUS

## 2017-07-27 MED ORDER — MIDAZOLAM HCL 2 MG/2ML IJ SOLN
INTRAMUSCULAR | Status: DC | PRN
Start: 2017-07-27 — End: 2017-07-27
  Administered 2017-07-27 (×2): 1 mg via INTRAVENOUS

## 2017-07-27 MED ORDER — PROPOFOL 10 MG/ML IV BOLUS
INTRAVENOUS | Status: DC | PRN
  Administered 2017-07-27: 200 mg via INTRAVENOUS

## 2017-07-27 MED ORDER — BSS PLUS IO SOLN
INTRAOCULAR | Status: DC | PRN
  Administered 2017-07-27: 1 via INTRAOCULAR

## 2017-07-27 MED ORDER — LIDOCAINE HCL 2 % IJ SOLN
INTRAMUSCULAR | Status: AC
  Filled 2017-07-27: qty 20

## 2017-07-27 MED ORDER — OXYCODONE HCL 5 MG/5ML PO SOLN: 5.0000 mg | Freq: Once | ORAL | Status: DC | PRN

## 2017-07-27 MED ORDER — PROMETHAZINE HCL 25 MG/ML IJ SOLN: 6.2500 mg | INTRAMUSCULAR | Status: DC | PRN

## 2017-07-27 MED ORDER — ONDANSETRON HCL 4 MG/2ML IJ SOLN
INTRAMUSCULAR | Status: DC | PRN
  Administered 2017-07-27: 4 mg via INTRAVENOUS

## 2017-07-27 MED ORDER — HYPROMELLOSE 2 % IO SOLN
INTRAOCULAR | Status: DC | PRN
  Administered 2017-07-27: 1 mL via INTRAOCULAR

## 2017-07-27 MED ORDER — PHENYLEPHRINE 40 MCG/ML (10ML) SYRINGE FOR IV PUSH (FOR BLOOD PRESSURE SUPPORT)
PREFILLED_SYRINGE | INTRAVENOUS | Status: AC
  Filled 2017-07-27: qty 10

## 2017-07-27 MED ORDER — TRIAMCINOLONE ACETONIDE 40 MG/ML IJ SUSP
INTRAMUSCULAR | Status: AC
  Filled 2017-07-27: qty 5

## 2017-07-27 MED ORDER — HYDROMORPHONE HCL 1 MG/ML IJ SOLN: 0.2500 mg | INTRAMUSCULAR | Status: DC | PRN

## 2017-07-27 MED ORDER — SODIUM CHLORIDE 0.45 % IV SOLN: INTRAVENOUS | Status: DC

## 2017-07-27 MED ORDER — POLYMYXIN B SULFATE 500000 UNITS IJ SOLR
INTRAMUSCULAR | Status: AC
  Filled 2017-07-27: qty 500000

## 2017-07-27 MED ORDER — FENTANYL CITRATE (PF) 250 MCG/5ML IJ SOLN
INTRAMUSCULAR | Status: AC
  Filled 2017-07-27: qty 5

## 2017-07-27 MED ORDER — TROPICAMIDE 1 % OP SOLN: 1.0000 [drp] | OPHTHALMIC | Status: DC | PRN

## 2017-07-27 MED ORDER — SODIUM HYALURONATE 10 MG/ML IO SOLN
INTRAOCULAR | Status: DC | PRN
Start: 2017-07-27 — End: 2017-07-27
  Administered 2017-07-27: 0.85 mL via INTRAOCULAR

## 2017-07-27 MED ORDER — EPINEPHRINE PF 1 MG/ML IJ SOLN
INTRAMUSCULAR | Status: DC | PRN
  Administered 2017-07-27: 1 mg

## 2017-07-27 MED ORDER — SUGAMMADEX SODIUM 200 MG/2ML IV SOLN
INTRAVENOUS | Status: DC | PRN
  Administered 2017-07-27: 200 mg via INTRAVENOUS

## 2017-07-27 MED ORDER — HYPROMELLOSE (GONIOSCOPIC) 2.5 % OP SOLN
OPHTHALMIC | Status: AC
  Filled 2017-07-27: qty 15

## 2017-07-27 MED ORDER — MORPHINE SULFATE (PF) 2 MG/ML IV SOLN: 1.0000 mg | INTRAVENOUS | Status: DC | PRN

## 2017-07-27 MED ORDER — ONDANSETRON HCL 4 MG/2ML IJ SOLN
INTRAMUSCULAR | Status: AC
  Filled 2017-07-27: qty 2

## 2017-07-27 MED ORDER — DEXAMETHASONE SODIUM PHOSPHATE 10 MG/ML IJ SOLN
INTRAMUSCULAR | Status: DC | PRN
  Administered 2017-07-27: 5 mg via INTRAVENOUS

## 2017-07-27 MED ORDER — MIDAZOLAM HCL 2 MG/2ML IJ SOLN
INTRAMUSCULAR | Status: AC
  Filled 2017-07-27: qty 2

## 2017-07-27 MED ORDER — PREDNISOLONE ACETATE 1 % OP SUSP
1.0000 [drp] | Freq: Four times a day (QID) | OPHTHALMIC | Status: DC
  Filled 2017-07-27: qty 5

## 2017-07-27 MED ORDER — STERILE WATER FOR INJECTION IJ SOLN
INTRAMUSCULAR | Status: AC
  Filled 2017-07-27: qty 20

## 2017-07-27 MED ORDER — HYDROCODONE-ACETAMINOPHEN 5-325 MG PO TABS: 1.0000 | ORAL_TABLET | ORAL | Status: DC | PRN

## 2017-07-27 SURGICAL SUPPLY — 86 items
APL SRG 3 HI ABS STRL LF PLS (MISCELLANEOUS) ×8
APPLICATOR DR MATTHEWS STRL (MISCELLANEOUS) ×16 IMPLANT
BALL CTTN LRG ABS STRL LF (GAUZE/BANDAGES/DRESSINGS) ×3
BAND CIRCLING RETINAL 2.5 (Ophthalmic Related) IMPLANT
BAND RTNL 125X2.5X.6 CRC (Ophthalmic Related) ×1 IMPLANT
BANDAGE EYE OVAL (MISCELLANEOUS) IMPLANT
BLADE EYE CATARACT 19 1.4 BEAV (BLADE) IMPLANT
BLADE MVR KNIFE 19G (BLADE) IMPLANT
CANNULA ANT CHAM MAIN (OPHTHALMIC RELATED) IMPLANT
CANNULA DUAL BORE 23G (CANNULA) IMPLANT
CANNULA DUALBORE 25G (CANNULA) ×1 IMPLANT
CANNULA VLV SOFT TIP 25G (OPHTHALMIC) IMPLANT
CANNULA VLV SOFT TIP 25GA (OPHTHALMIC) IMPLANT
CORDS BIPOLAR (ELECTRODE) IMPLANT
COTTONBALL LRG STERILE PKG (GAUZE/BANDAGES/DRESSINGS) ×6 IMPLANT
COVER SURGICAL LIGHT HANDLE (MISCELLANEOUS) ×2 IMPLANT
DRAPE OPHTHALMIC 77X100 STRL (CUSTOM PROCEDURE TRAY) ×2 IMPLANT
ERASER HMR WETFIELD 23G BP (MISCELLANEOUS) IMPLANT
FILTER BLUE MILLIPORE (MISCELLANEOUS) ×4 IMPLANT
FILTER STRAW FLUID ASPIR (MISCELLANEOUS) ×1 IMPLANT
FORCEPS GRIESHABER ILM 25G A (INSTRUMENTS) IMPLANT
GAS AUTO FILL CONSTEL (OPHTHALMIC) ×2
GAS AUTO FILL CONSTELLATION (OPHTHALMIC) IMPLANT
GAS OPHTHALMIC (MISCELLANEOUS) IMPLANT
GLOVE SS BIOGEL STRL SZ 6.5 (GLOVE) ×1 IMPLANT
GLOVE SS BIOGEL STRL SZ 7 (GLOVE) ×1 IMPLANT
GLOVE SUPERSENSE BIOGEL SZ 6.5 (GLOVE) ×1
GLOVE SUPERSENSE BIOGEL SZ 7 (GLOVE) ×1
GLOVE SURG 8.5 LATEX PF (GLOVE) ×2 IMPLANT
GOWN STRL REUS W/ TWL LRG LVL3 (GOWN DISPOSABLE) ×2 IMPLANT
GOWN STRL REUS W/TWL LRG LVL3 (GOWN DISPOSABLE) ×4
HANDLE PNEUMATIC FOR CONSTEL (OPHTHALMIC) IMPLANT
IMPL SILICONE (Ophthalmic Related) ×1 IMPLANT
IMPLANT SILICONE (Ophthalmic Related) ×4 IMPLANT
KIT BASIN OR (CUSTOM PROCEDURE TRAY) ×2 IMPLANT
KIT PERFLUORON PROCEDURE 5ML (MISCELLANEOUS) IMPLANT
KIT ROOM TURNOVER OR (KITS) ×2 IMPLANT
KNIFE CRESCENT 1.75 EDGEAHEAD (BLADE) IMPLANT
KNIFE GRIESHABER SHARP 2.5MM (MISCELLANEOUS) ×7 IMPLANT
MICROPICK 25G (MISCELLANEOUS)
NDL 18GX1X1/2 (RX/OR ONLY) (NEEDLE) ×1 IMPLANT
NDL 25GX 5/8IN NON SAFETY (NEEDLE) IMPLANT
NDL FILTER BLUNT 18X1 1/2 (NEEDLE) ×1 IMPLANT
NDL HYPO 30X.5 LL (NEEDLE) ×2 IMPLANT
NDL PRECISIONGLIDE 27X1.5 (NEEDLE) IMPLANT
NEEDLE 18GX1X1/2 (RX/OR ONLY) (NEEDLE) ×2 IMPLANT
NEEDLE 25GX 5/8IN NON SAFETY (NEEDLE) IMPLANT
NEEDLE FILTER BLUNT 18X 1/2SAF (NEEDLE) ×1
NEEDLE FILTER BLUNT 18X1 1/2 (NEEDLE) ×1 IMPLANT
NEEDLE HYPO 30X.5 LL (NEEDLE) ×4 IMPLANT
NEEDLE PRECISIONGLIDE 27X1.5 (NEEDLE) IMPLANT
NS IRRIG 1000ML POUR BTL (IV SOLUTION) ×2 IMPLANT
PACK VITRECTOMY CUSTOM (CUSTOM PROCEDURE TRAY) ×2 IMPLANT
PAD ARMBOARD 7.5X6 YLW CONV (MISCELLANEOUS) ×4 IMPLANT
PAK VITRECTOMY PIK 25 GA (OPHTHALMIC RELATED) ×1 IMPLANT
PIC ILLUMINATED 25G (OPHTHALMIC)
PICK MICROPICK 25G (MISCELLANEOUS) IMPLANT
PIK ILLUMINATED 25G (OPHTHALMIC) IMPLANT
PROBE LASER ILLUM FLEX CVD 25G (OPHTHALMIC) ×1 IMPLANT
REPL STRA BRUSH NDL (NEEDLE) IMPLANT
REPL STRA BRUSH NEEDLE (NEEDLE) IMPLANT
RESERVOIR BACK FLUSH (MISCELLANEOUS) IMPLANT
ROLLS DENTAL (MISCELLANEOUS) ×4 IMPLANT
SET FLUID INJECTOR (SET/KITS/TRAYS/PACK) IMPLANT
SLEEVE SCLERAL BUCK TYPE 70 (Ophthalmic Related) ×1 IMPLANT
SLEEVE SCLERAL TYPE 270 (Ophthalmic Related) ×1 IMPLANT
SPEAR EYE SURG WECK-CEL (MISCELLANEOUS) ×6 IMPLANT
SPONGE SURGIFOAM ABS GEL 12-7 (HEMOSTASIS) IMPLANT
STOPCOCK 4 WAY LG BORE MALE ST (IV SETS) IMPLANT
SUT CHROMIC 7 0 TG140 8 (SUTURE) ×2 IMPLANT
SUT ETHILON 9 0 TG140 8 (SUTURE) IMPLANT
SUT MERSILENE 4 0 RV 2 (SUTURE) ×4 IMPLANT
SUT SILK 2 0 (SUTURE) ×2
SUT SILK 2-0 18XBRD TIE 12 (SUTURE) ×1 IMPLANT
SUT SILK 4 0 RB 1 (SUTURE) ×2 IMPLANT
SUT VICRYL 7 0 TG140 8 (SUTURE) IMPLANT
SYR 10ML LL (SYRINGE) ×3 IMPLANT
SYR 20CC LL (SYRINGE) ×2 IMPLANT
SYR 5ML LL (SYRINGE) ×2 IMPLANT
SYR BULB 3OZ (MISCELLANEOUS) ×2 IMPLANT
SYR TB 1ML LUER SLIP (SYRINGE) IMPLANT
TIRE RTNL 2.5XGRV CNCV 9X (Ophthalmic Related) IMPLANT
TOWEL OR 17X24 6PK STRL BLUE (TOWEL DISPOSABLE) ×6 IMPLANT
TUBING HIGH PRESS EXTEN 6IN (TUBING) IMPLANT
WATER STERILE IRR 1000ML POUR (IV SOLUTION) ×2 IMPLANT
WIPE INSTRUMENT VISIWIPE 73X73 (MISCELLANEOUS) ×2 IMPLANT

## 2017-07-27 NOTE — Transfer of Care (Signed)
Immediate Anesthesia Transfer of Care Note  Patient: Margaret Gilmore  Procedure(s) Performed: SCLERAL BUCKLE  25 GAUGE PARS PLANA VITRECTOMY, C3F8 INJECTION (Right Eye) LASER PHOTO ABLATION (Right Eye) GAS/FLUID EXCHANGE (Right Eye)  Patient Location: PACU  Anesthesia Type:General  Level of Consciousness: drowsy and patient cooperative  Airway & Oxygen Therapy: Patient Spontanous Breathing  Post-op Assessment: Report given to RN, Post -op Vital signs reviewed and stable and Patient moving all extremities X 4  Post vital signs: Reviewed and stable  Last Vitals:  Vitals:   07/27/17 0920  BP: 130/75  Pulse: 67  Resp: 20  Temp: 36.5 C  SpO2: 100%    Last Pain: There were no vitals filed for this visit.       Complications: No apparent anesthesia complications

## 2017-07-27 NOTE — H&P (Signed)
I examined the patient today and there is no change in the medical status 

## 2017-07-27 NOTE — Progress Notes (Signed)
Patient admitted from PACU. Alert and oriented x4.Dressing to right eye with some drainage. Denies pain at this time. Oriented to room and call bell.

## 2017-07-27 NOTE — H&P (Signed)
Margaret Gilmore is an 62 y.o. female.   Chief Complaint: loss of superior vision right eye  HPI: Had macular hole surgery 06-22-17.  Noted vision loss last week right eye  Past Medical History:  Diagnosis Date  . Anxiety   . Arthritis   . B12 deficiency    hx of , no longer taking medication  . Cataracts, bilateral   . Constipation   . Constipation   . Depression   . Family history of adverse reaction to anesthesia    mom had problems waking up  . Fatty liver   . GERD (gastroesophageal reflux disease)   . Headache    migraines   . Macular hole of left eye   . Trigeminal neuralgia    right side of face is numb  . Unspecified vitamin D deficiency     Past Surgical History:  Procedure Laterality Date  . [place] VITRECTOMY WITH 20 GAUGE MVR PORT FOR MACULAR HOLE Left 03/05/2015   Procedure: 25 GAUGE PARS PLANA VITRECTOMY WITH 20 GAUGE MVR PORT FOR MACULAR HOLE;  Surgeon: [name], MD;  Location: [place];  Service: Ophthalmology;  Laterality: Left;  . 25 GAUGE PARS PLANA VITRECTOMY WITH 20 GAUGE MVR PORT FOR MACULAR HOLE Right 06/22/2017  . 25 GAUGE PARS PLANA VITRECTOMY WITH 20 GAUGE MVR PORT FOR MACULAR HOLE Right 06/22/2017   Procedure: 25 GAUGE PARS PLANA VITRECTOMY WITH 20 GAUGE MVR PORT FOR MACULAR HOLE;  Surgeon: [name], MD;  Location: [place];  Service: Ophthalmology;  Laterality: Right;  . ABDOMINAL HYSTERECTOMY    . APPENDECTOMY    . CESAREAN SECTION    . COLONOSCOPY    . esophagus stretched    . GAS/FLUID EXCHANGE Left 03/05/2015   Procedure: GAS/FLUID EXCHANGE;  Surgeon: [name], MD;  Location: [place];  Service: Ophthalmology;  Laterality: Left;  CF38  . GAS/FLUID EXCHANGE Right 06/22/2017   Procedure: GAS/FLUID EXCHANGE;  Surgeon: [name], MD;  Location: [place];  Service: Ophthalmology;  Laterality: Right;  . knot removed from right side of abdomen    . LASER PHOTO ABLATION Left 03/05/2015   Procedure: LASER PHOTO ABLATION;   Surgeon: [name], MD;  Location: [place];  Service: Ophthalmology;  Laterality: Left;  Headscope Laser  . LASER PHOTO ABLATION Right 06/22/2017   Procedure: LASER PHOTO ABLATION;  Surgeon: [name], MD;  Location: [place];  Service: Ophthalmology;  Laterality: Right;  . MEMBRANE PEEL Left 03/05/2015   Procedure: MEMBRANE PEEL;  Surgeon: [name], MD;  Location: [place];  Service: Ophthalmology;  Laterality: Left;  . MEMBRANE PEEL Right 06/22/2017   Procedure: MEMBRANE PEEL;  Surgeon: [name], MD;  Location: [place];  Service: Ophthalmology;  Laterality: Right;  . SERUM PATCH Left 03/05/2015   Procedure: SERUM PATCH;  Surgeon: [name], MD;  Location: [place];  Service: Ophthalmology;  Laterality: Left;  . SERUM PATCH Right 06/22/2017   Procedure: SERUM PATCH;  Surgeon: [name], MD;  Location: [place];  Service: Ophthalmology;  Laterality: Right;  . trigeminal       Family History  Problem Relation Age of Onset  . Pancreatic cancer Father   . Hyperlipidemia Father   . Heart disease Father   . Hypertension Father   . Diabetes Mother   . COPD Mother   . Hyperlipidemia Mother   . Hypertension Brother   . Dementia Maternal Grandmother   .  Cancer Maternal Grandfather        bone  . Cancer Maternal Aunt        BREAST  . Cancer Paternal Aunt        HODGKINS  . Cancer Paternal Uncle        BONE  . Colon cancer Paternal Uncle    Social History:  reports that  has never smoked. she has never used smokeless tobacco. She reports that she does not drink alcohol or use drugs.  Allergies:  Allergies  Allergen Reactions  . Lyrica [Pregabalin] Swelling and Other (See Comments)    SWELLING REACTION UNSPECIFIED   . Eggs Or Egg-Derived Products Other (See Comments)    Headaches  . Sulfa Antibiotics Other (See Comments)    Headache    Medications Prior to Admission  Medication Sig Dispense Refill  . acetaminophen (TYLENOL) 500 MG tablet Take 1,000 mg by mouth  every 6 (six) hours as needed for moderate pain or headache.    Marland Kitchen aspirin-acetaminophen-caffeine (EXCEDRIN MIGRAINE) 250-250-65 MG tablet Take 1 tablet by mouth every 6 (six) hours as needed for headache.    . bacitracin-polymyxin b (POLYSPORIN) ophthalmic ointment Place 1 application into the right eye 3 (three) times daily. apply to eye every 12 hours while awake (Patient taking differently: Place 1 application into the right eye at bedtime. ) 3.5 g 0  . pantoprazole (PROTONIX) 40 MG tablet Take 1 tablet (40 mg total) by mouth daily. (Patient taking differently: Take 40 mg by mouth daily as needed (for acid reflux). ) 30 tablet 3  . prednisoLONE acetate (PRED FORTE) 1 % ophthalmic suspension Place 1 drop into the right eye 4 (four) times daily. 5 mL 0  . gatifloxacin (ZYMAXID) 0.5 % SOLN Place 1 drop into the right eye 4 (four) times daily. (Patient not taking: Reported on 07/20/2017)    . sucralfate (CARAFATE) 1 g tablet Take 1 tablet (1 g total) by mouth 4 (four) times daily -  with meals and at bedtime. (Patient not taking: Reported on 06/10/2017) 60 tablet 1    Review of systems otherwise negative  Blood pressure 130/75, pulse 67, temperature 97.7 F (36.5 C), resp. rate 20, height 5\' 5"  (1.651 m), weight 186 lb (84.4 kg), SpO2 100 %.  Physical exam: Mental status: oriented x3. Eyes: See eye exam associated with this date of surgery in media tab.  Scanned in by scanning center Ears, Nose, Throat: within normal limits Neck: Within Normal limits General: within normal limits Chest: Within normal limits Breast: deferred Heart: Within normal limits Abdomen: Within normal limits GU: deferred Extremities: within normal limits Skin: within normal limits  Assessment/Plan Rhegmatogenous retinal detachment right eye.  Macular hole right eye  Plan: To [facility] for :  Scleral buckle right eye, gas injection right eye, pars plana vitrectomy with possible serum patch right eye.  C3F8  injection if needed.  [name] 07/27/2017, 9:45 AM

## 2017-07-27 NOTE — Brief Op Note (Signed)
Brief Operative note   Preoperative diagnosis:  Complex,retinal detachment right eye Postoperative diagnosis  * No Diagnosis Codes entered *  Procedures: Repair of complex traction retinal detachment with pars plana vitrectomy, membrane peel, laser, gas injection, drainage of sub retinal fluid. Right eye  Surgeon:  [name], MD...  Assistant:  Deatra Ina SA    Anesthesia: General  Specimen: none  Estimated blood loss:  1cc  Complications: none  Patient sent to PACU in good condition  Composed by [name] MD  Dictation number: [id number]

## 2017-07-27 NOTE — Anesthesia Preprocedure Evaluation (Addendum)
Anesthesia Evaluation  Patient identified by MRN, date of birth, ID band Patient awake    Reviewed: Allergy & Precautions, NPO status , Patient's Chart, lab work & pertinent test results  Airway Mallampati: II  TM Distance: >3 FB Neck ROM: Full    Dental no notable dental hx.    Pulmonary neg pulmonary ROS,    Pulmonary exam normal breath sounds clear to auscultation       Cardiovascular negative cardio ROS Normal cardiovascular exam Rhythm:Regular Rate:Normal  ECG: NSR, rate 66   Neuro/Psych  Headaches, PSYCHIATRIC DISORDERS Anxiety Depression Trigeminal neuralgia    GI/Hepatic GERD  Medicated and Controlled,Fatty liver   Endo/Other  negative endocrine ROS  Renal/GU negative Renal ROS     Musculoskeletal negative musculoskeletal ROS (+)   Abdominal (+) + obese,   Peds  Hematology negative hematology ROS (+)   Anesthesia Other Findings retinal detachment right eye  Reproductive/Obstetrics                            Anesthesia Physical Anesthesia Plan  ASA: II  Anesthesia Plan: General   Post-op Pain Management:    Induction: Intravenous  PONV Risk Score and Plan: 3 and Ondansetron, Dexamethasone, Midazolam and Treatment may vary due to age or medical condition  Airway Management Planned: Oral ETT  Additional Equipment:   Intra-op Plan:   Post-operative Plan: Extubation in OR  Informed Consent: I have reviewed the patients History and Physical, chart, labs and discussed the procedure including the risks, benefits and alternatives for the proposed anesthesia with the patient or authorized representative who has indicated his/her understanding and acceptance.   Dental advisory given  Plan Discussed with: CRNA  Anesthesia Plan Comments:         Anesthesia Quick Evaluation

## 2017-07-27 NOTE — Anesthesia Procedure Notes (Signed)
Procedure Name: Intubation Date/Time: 07/27/2017 1:53 PM Performed by: Julieta Bellini, CRNA Pre-anesthesia Checklist: Patient identified, Emergency Drugs available, Suction available and Patient being monitored Patient Re-evaluated:Patient Re-evaluated prior to induction Oxygen Delivery Method: Circle system utilized Preoxygenation: Pre-oxygenation with 100% oxygen Induction Type: IV induction Ventilation: Mask ventilation without difficulty Laryngoscope Size: 3 Grade View: Grade I Tube type: Oral Tube size: 7.0 mm Number of attempts: 1 Airway Equipment and Method: Stylet Placement Confirmation: ETT inserted through vocal cords under direct vision,  positive ETCO2 and breath sounds checked- equal and bilateral Secured at: 21 cm Tube secured with: Tape Dental Injury: Teeth and Oropharynx as per pre-operative assessment

## 2017-07-28 ENCOUNTER — Encounter (HOSPITAL_COMMUNITY): Payer: Self-pay | Admitting: Ophthalmology

## 2017-07-28 DIAGNOSIS — H33021 Retinal detachment with multiple breaks, right eye: Secondary | ICD-10-CM | POA: Diagnosis not present

## 2017-07-28 MED ORDER — TOBRAMYCIN 0.3 % OP SOLN: 1.0000 [drp] | Freq: Four times a day (QID) | OPHTHALMIC | 0 refills | Status: DC

## 2017-07-28 MED ORDER — BACITRACIN-POLYMYXIN B 500-10000 UNIT/GM OP OINT: 1.0000 "application " | TOPICAL_OINTMENT | Freq: Three times a day (TID) | OPHTHALMIC | 0 refills | Status: DC

## 2017-07-28 MED ORDER — PREDNISOLONE ACETATE 1 % OP SUSP: 1.0000 [drp] | Freq: Four times a day (QID) | OPHTHALMIC | 0 refills | Status: DC

## 2017-07-28 MED ORDER — HYPROMELLOSE (GONIOSCOPIC) 2.5 % OP SOLN: 1.0000 [drp] | Freq: Four times a day (QID) | OPHTHALMIC | 12 refills | Status: DC

## 2017-07-28 MED ORDER — BRIMONIDINE TARTRATE 0.2 % OP SOLN: 1.0000 [drp] | Freq: Two times a day (BID) | OPHTHALMIC | 12 refills | Status: DC

## 2017-07-28 MED ORDER — LATANOPROST 0.005 % OP SOLN: 1.0000 [drp] | Freq: Every day | OPHTHALMIC | 12 refills | Status: DC

## 2017-07-28 MED ORDER — GATIFLOXACIN 0.5 % OP SOLN: 1.0000 [drp] | Freq: Four times a day (QID) | OPHTHALMIC | Status: DC

## 2017-07-28 NOTE — Progress Notes (Signed)
07/28/2017, 6:46 AM  Mental Status:  Awake, Alert, Oriented  Anterior segment: Cornea  Clear    Anterior Chamber Clear    Lens:   Cataract  Intra Ocular Pressure 26 mmHg with Tonopen  Vitreous: Clear 95%gas bubble   Retina:  Attached Good laser reaction   Impression: Excellent result Retina attached   Final Diagnosis: Active Problems:   Retinal detachment with multiple breaks, right   Plan: start post operative eye drops.  Add alphagan and Xalatan OD.  Discharge to home.  Give post operative instructions.  Note:  Patient had scratchiness in left eye after surgery.  Nursing called the on-call doctor and he ordered artificial tears and tobramycin for the left eye. Patient will continue these medications as needed at home.  This is in addition to her post op medications for the right, operative, eye.  [name] 07/28/2017, 6:46 AM

## 2017-07-28 NOTE — Discharge Summary (Signed)
Discharge summary not needed on [facility] patients per medical records. 

## 2017-07-28 NOTE — Anesthesia Postprocedure Evaluation (Signed)
Anesthesia Post Note  Patient: Margaret Gilmore  Procedure(s) Performed: SCLERAL BUCKLE  25 GAUGE PARS PLANA VITRECTOMY, C3F8 INJECTION REPAIR OF COMPLEX TRACTION OF RETINAL DETACHMENT (Right Eye) LASER PHOTO ABLATION (Right Eye) GAS/FLUID EXCHANGE (Right Eye) MEMBRANE PEEL (Right Eye)     Patient location during evaluation: PACU Anesthesia Type: General Level of consciousness: awake and alert Pain management: pain level controlled Vital Signs Assessment: post-procedure vital signs reviewed and stable Respiratory status: spontaneous breathing, nonlabored ventilation, respiratory function stable and patient connected to nasal cannula oxygen Cardiovascular status: blood pressure returned to baseline and stable Postop Assessment: no apparent nausea or vomiting Anesthetic complications: no    Last Vitals:  Vitals:   07/28/17 0258 07/28/17 0521  BP: 122/60 102/61  Pulse: 66 82  Resp: 16 16  Temp: 36.9 C 36.9 C  SpO2: 100% 97%    Last Pain:  Vitals:   07/28/17 0521  TempSrc: Oral  PainSc:                  Riccardo Dubin

## 2017-07-28 NOTE — Op Note (Signed)
NAMEYOCHEVED, DEPNER               ACCOUNT NO.:  [id number]  MEDICAL RECORD NO.:  [id number]  LOCATION:                                 FACILITY:  PHYSICIAN:  John D. Zigmund Daniel, M.D. DATE OF BIRTH:  04-19-1956  DATE OF PROCEDURE:  07/27/2017 DATE OF DISCHARGE:                              OPERATIVE REPORT   ADMISSION DIAGNOSES:  Rhegmatogenous retinal detachment, complex retinal detachment, posterior membranes, macular hole, right eye.  PROCEDURES:  Repair of complex traction retinal detachment with scleral buckle, pars plana vitrectomy, membrane peel, retinal photocoagulation, gas/fluid exchange in the right eye.  SURGEON:  Chrystie Nose. Zigmund Daniel, M.D.  ASSISTANT:  Deatra Ina, SA.  ANESTHESIA:  General.  DETAILS:  Usual prep and drape, 360-degree limbal peritomy, isolation of 4 rectus muscles on 2-0 silk, scleral dissection for 360 degrees to admit a #279 intrascleral implant with 1-mm trim from the posterior edge.  Diathermy placed in the bed.  Two sutures per quadrant for a total of 8 scleral sutures were placed in the scleral flaps.  The 279 implant was trimmed and it was placed around the globe with the joint at 2 o'clock.  A 240 band was placed around the eye with a 270 sleeve at 2 o'clock.  Perforation site was chosen at 6 o'clock.  After cutdown of the sclera and diathermy of the choroid, the incision was made with the white diathermy tip.  A large amount of clear colorless subretinal fluid came forth in a controlled manner.  Once the fluid stopped, the scleral flaps were closed over the scleral buckle and over the drain.  The buckle was adjusted and trimmed.  The band was adjusted and trimmed. Indirect ophthalmoscopy showed the retina to be lying nicely on the scleral buckle inferiorly with some folds in the retina and posterior membranes.  The scleral sutures were knotted and the free ends removed. The 240 band was adjusted and trimmed.  A 25-gauge trocars were  placed at 8, 10, and 2 o'clock in preparation for vitrectomy.  Provisc placed on the corneal surface and the BIOM viewing system moved into place. The pars plana vitrectomy was begun just behind the cataractous lens. Posterior membranes were seen and these were carefully removed with vitreous cutter.  ILM forceps were used.  Once all of the vitreous was removed, a total gas-fluid exchange was carried out.  The endolaser was positioned in the eye.  550 burns were placed around the retinal breaks and on the scleral buckle.  The power was 300 mW and 1000 microns each. A total gas-fluid exchange was carried out.  C3F8 in a 14% concentration was exchanged for intravitreal gas.  The indirect ophthalmoscope laser was moved into place.  707 burns were placed around the retinal periphery on the scleral buckle.  The power was 1500 mW 0.2 seconds each and 1000 microns each.  The instruments were removed from the eye.  25- gauge trocars were removed from the eye.  The wounds were tested and found to be secure.  The conjunctiva was reposited with 7-0 chromic suture.  Polymyxin and ceftazidime were rinsed around the globe for antibiotic coverage.  Decadron 10 mg was injected into  the lower subconjunctival space.  Atropine solution was applied.  Marcaine was injected around the globe for postop pain.  Closing pressure was 10 with a Barraquer tonometer.  Polysporin ophthalmic ointment, patch and shield were placed.  The patient was awakened and taken to Recovery in satisfactory condition.  COMPLICATIONS:  None.  DURATION:  2-1/2 hours.     Chrystie Nose. Zigmund Daniel, M.D.     JDM/MEDQ  D:  07/27/2017  T:  07/28/2017  Job:  [id number]

## 2017-08-03 ENCOUNTER — Encounter (INDEPENDENT_AMBULATORY_CARE_PROVIDER_SITE_OTHER): Payer: BLUE CROSS/BLUE SHIELD | Admitting: Ophthalmology

## 2017-08-03 DIAGNOSIS — H338 Other retinal detachments: Secondary | ICD-10-CM

## 2017-08-04 ENCOUNTER — Encounter: Payer: Self-pay | Admitting: Physician Assistant

## 2017-08-18 NOTE — Progress Notes (Signed)
Assessment and Plan:  Margaret Gilmore was seen today for gastroesophageal reflux.  Diagnoses and all orders for this visit:  Odynophagia/ History of esophageal stricture Suspect recurrent stricture or hiatal hernia; has hx of stricture with dilation by Dr. Fuller Plan in 2015. Discussed concerns regarding recurrence and recommended she call Dr. Fuller Plan today to set up an appointment to discuss repeat EGD and dilation as indicated. Discussed avoiding problem foods - recommended protein shakes, soft vegetables, scrambled eggs, etc and to avoid problem foods. Call back if need a new referral.  Gastroesophageal reflux disease, esophagitis presence not specified Stop protonix, start nexium; discussed should take 30-60 min prior to first meal of day. Diet discussed, information provided on AVS.  -     esomeprazole (NEXIUM) 40 MG capsule; Take 1 capsule (40 mg total) by mouth daily.  Obesity with comorbidities Long discussion about weight loss, diet, and exercise Discussed may need to postpone some suggestions until after odynophagia resolved, but recommended diet heavy in fiber (fruits, veggies, beans) and low in animal meats, cheeses, and dairy products, appropriate calorie intake, low sugar/flour/white carb intake Discussed appropriate weight for height  Follow up at next visit  Further disposition pending results of labs. Discussed med's effects and SE's.   Over 30 minutes of exam, counseling, chart review, and critical decision making was performed.   Future Appointments  Date Time Provider [place]  08/25/2017  9:15 AM [name], MD [facility] None    ------------------------------------------------------------------------------------------------------------------   HPI BP 117/79   Pulse 66   Temp (!) 97.3 F (36.3 C)   Ht 5\' 5"  (1.651 m)   Wt 195 lb (88.5 kg)   SpO2 97%   BMI 32.45 kg/m   62 y.o.female with documented hx of GERD, esophageal stricture by EGD with dilation in 2015  presents to discuss pain with eating ongoing for several months but significantly worse since February. She reports she typically wakes up and has a protein shake for breakfast without difficulty, but reports with any solid foods, particularly breads, pasta, beans, meats, etc as well as fried foods she immediately experiences a sensation of a large object stuck in her chest, severe pain. She is prescribed protonix 40 mg daily - reports she currently takes only after starting to have symptoms, does not take 30-60 min prior to eating. She does sleep at night with head of bed elevated, but reports she has had to get up due to pain in her chest/reflux. She denies other symptoms - dyspnea, dizziness, fatigue/malaise, weight loss, night sweats, extremity or neck pain, nausea, vomiting.   Has hx of EGD with esophageal stricture requiring dilation in 2015 by Dr. Fuller Plan -   She is also very concerned about ongoing weight issues and wants to discuss today; she has been working on diet, but exercise is limited due to recent ocular surgery for macular hole of right eye. She has been drinking a protein shake for breakfast, and avoids red meats but otherwise demonstrates poor understanding of nutrition for weight loss. She reports she has previously done isogenix which worked well and gave energy but cannot afford it as is $400 monthly.   BMI is Body mass index is 32.45 kg/m. Wt Readings from Last 3 Encounters:  08/19/17 195 lb (88.5 kg)  07/27/17 192 lb 7.4 oz (87.3 kg)  06/22/17 186 lb (84.4 kg)     Past Medical History:  Diagnosis Date  . Anxiety   . Arthritis   . B12 deficiency    hx  of , no longer taking medication  . Cataracts, bilateral   . Constipation   . Constipation   . Depression   . Family history of adverse reaction to anesthesia    mom had problems waking up  . Fatty liver   . GERD (gastroesophageal reflux disease)   . Headache    migraines   . Macular hole of left eye   . Trigeminal  neuralgia    right side of face is numb  . Unspecified vitamin D deficiency      Allergies  Allergen Reactions  . Lyrica [Pregabalin] Swelling and Other (See Comments)    SWELLING REACTION UNSPECIFIED   . Eggs Or Egg-Derived Products Other (See Comments)    Headaches  . Sulfa Antibiotics Other (See Comments)    Headache    Current Outpatient Medications on File Prior to Visit  Medication Sig  . acetaminophen (TYLENOL) 500 MG tablet Take 1,000 mg by mouth every 6 (six) hours as needed for moderate pain or headache.  . bacitracin-polymyxin b (POLYSPORIN) ophthalmic ointment Place 1 application into the right eye 3 (three) times daily. apply to eye every 12 hours while awake (Patient taking differently: Place 1 application into the right eye at bedtime. )  . brimonidine (ALPHAGAN) 0.2 % ophthalmic solution Place 1 drop into the right eye 2 (two) times daily.  . pantoprazole (PROTONIX) 40 MG tablet Take 1 tablet (40 mg total) by mouth daily. (Patient taking differently: Take 40 mg by mouth daily as needed (for acid reflux). )  . prednisoLONE acetate (PRED FORTE) 1 % ophthalmic suspension Place 1 drop into the right eye 4 (four) times daily.  . sucralfate (CARAFATE) 1 g tablet Take 1 tablet (1 g total) by mouth 4 (four) times daily -  with meals and at bedtime.  Marland Kitchen aspirin-acetaminophen-caffeine (EXCEDRIN MIGRAINE) 250-250-65 MG tablet Take 1 tablet by mouth every 6 (six) hours as needed for headache.  . bacitracin-polymyxin b (POLYSPORIN) ophthalmic ointment Place 1 application into the right eye 3 (three) times daily. apply to eye every 12 hours while awake  . gatifloxacin (ZYMAXID) 0.5 % SOLN Place 1 drop into the right eye 4 (four) times daily. (Patient not taking: Reported on 07/20/2017)  . gatifloxacin (ZYMAXID) 0.5 % SOLN Place 1 drop into the right eye 4 (four) times daily. (Patient not taking: Reported on 08/19/2017)  . hydroxypropyl methylcellulose / hypromellose (ISOPTO TEARS / GONIOVISC)  2.5 % ophthalmic solution Place 1 drop into the left eye 4 (four) times daily. (Patient not taking: Reported on 08/19/2017)  . latanoprost (XALATAN) 0.005 % ophthalmic solution Place 1 drop into the right eye at bedtime. (Patient not taking: Reported on 08/19/2017)  . prednisoLONE acetate (PRED FORTE) 1 % ophthalmic suspension Place 1 drop into the right eye 4 (four) times daily.  Marland Kitchen tobramycin (TOBREX) 0.3 % ophthalmic solution Place 1 drop into the left eye every 6 (six) hours. (Patient not taking: Reported on 08/19/2017)   No current facility-administered medications on file prior to visit.     ROS: Review of Systems  Constitutional: Negative for chills, diaphoresis, fever, malaise/fatigue and weight loss.  Respiratory: Negative for cough, shortness of breath and wheezing.   Cardiovascular: Positive for chest pain. Negative for palpitations, orthopnea, claudication and leg swelling.       Chest pain with eating food only, not associated with exertion  Gastrointestinal: Negative for abdominal pain, blood in stool, constipation, diarrhea, heartburn, melena, nausea and vomiting.  Genitourinary: Negative.   Musculoskeletal: Negative  for joint pain and myalgias.  Skin: Negative for rash.  Neurological: Negative for dizziness, tingling, sensory change, weakness and headaches.  Endo/Heme/Allergies: Negative for polydipsia.  Psychiatric/Behavioral: Negative.   All other systems reviewed and are negative.   Physical Exam:  BP 117/79   Pulse 66   Temp (!) 97.3 F (36.3 C)   Ht 5\' 5"  (1.651 m)   Wt 195 lb (88.5 kg)   SpO2 97%   BMI 32.45 kg/m   General Appearance: Well nourished, in no apparent distress. Eyes: PRRL, right conjunctiva injected, tearing present - has follow up with ophth scheduled. ENT/Mouth: Ext aud canals clear, TMs without erythema, bulging. No erythema, swelling, or exudate on post pharynx.  Tonsils not swollen or erythematous. Hearing normal.  Neck: Supple, thyroid normal.   Respiratory: Respiratory effort normal, BS equal bilaterally without rales, rhonchi, wheezing or stridor.  Cardio: RRR with no MRGs. Brisk peripheral pulses without edema.  Abdomen: Soft, + BS.  Mild epigastric tenderness with deep palpation, no guarding, rebound, hernias, masses. Lymphatics: Non tender without lymphadenopathy.  Musculoskeletal: normal gait.  Skin: Warm, dry without rashes, lesions, ecchymosis.  Psych: Awake and oriented X 3, normal affect, Insight and Judgment appropriate.     Izora Ribas, NP 11:00 AM [facility]

## 2017-08-19 ENCOUNTER — Encounter: Payer: Self-pay | Admitting: Adult Health

## 2017-08-19 ENCOUNTER — Ambulatory Visit: Payer: BLUE CROSS/BLUE SHIELD | Admitting: Adult Health

## 2017-08-19 VITALS — BP 117/79 | HR 66 | Temp 97.3°F | Ht 65.0 in | Wt 195.0 lb

## 2017-08-19 DIAGNOSIS — E669 Obesity, unspecified: Secondary | ICD-10-CM | POA: Diagnosis not present

## 2017-08-19 DIAGNOSIS — Z8719 Personal history of other diseases of the digestive system: Secondary | ICD-10-CM | POA: Diagnosis not present

## 2017-08-19 DIAGNOSIS — R1310 Dysphagia, unspecified: Secondary | ICD-10-CM

## 2017-08-19 DIAGNOSIS — K219 Gastro-esophageal reflux disease without esophagitis: Secondary | ICD-10-CM | POA: Diagnosis not present

## 2017-08-19 MED ORDER — ESOMEPRAZOLE MAGNESIUM 40 MG PO CPDR: 40.0000 mg | DELAYED_RELEASE_CAPSULE | Freq: Every day | ORAL | 1 refills | Status: DC

## 2017-08-19 NOTE — Patient Instructions (Signed)
Gastroesophageal Reflux Disease, Adult Normally, food travels down the esophagus and stays in the stomach to be digested. However, when a person has gastroesophageal reflux disease (GERD), food and stomach acid move back up into the esophagus. When this happens, the esophagus becomes sore and inflamed. Over time, GERD can create small holes (ulcers) in the lining of the esophagus. What are the causes? This condition is caused by a problem with the muscle between the esophagus and the stomach (lower esophageal sphincter, or LES). Normally, the LES muscle closes after food passes through the esophagus to the stomach. When the LES is weakened or abnormal, it does not close properly, and that allows food and stomach acid to go back up into the esophagus. The LES can be weakened by certain dietary substances, medicines, and medical conditions, including:  Tobacco use.  Pregnancy.  Having a hiatal hernia.  Heavy alcohol use.  Certain foods and beverages, such as coffee, chocolate, onions, and peppermint.  What increases the risk? This condition is more likely to develop in:  People who have an increased body weight.  People who have connective tissue disorders.  People who use NSAID medicines.  What are the signs or symptoms? Symptoms of this condition include:  Heartburn.  Difficult or painful swallowing.  The feeling of having a lump in the throat.  Abitter taste in the mouth.  Bad breath.  Having a large amount of saliva.  Having an upset or bloated stomach.  Belching.  Chest pain.  Shortness of breath or wheezing.  Ongoing (chronic) cough or a night-time cough.  Wearing away of tooth enamel.  Weight loss.  Different conditions can cause chest pain. Make sure to see your health care provider if you experience chest pain. How is this diagnosed? Your health care provider will take a medical history and perform a physical exam. To determine if you have mild or  severe GERD, your health care provider may also monitor how you respond to treatment. You may also have other tests, including:  An endoscopy toexamine your stomach and esophagus with a small camera.  A test thatmeasures the acidity level in your esophagus.  A test thatmeasures how much pressure is on your esophagus.  A barium swallow or modified barium swallow to show the shape, size, and functioning of your esophagus.  How is this treated? The goal of treatment is to help relieve your symptoms and to prevent complications. Treatment for this condition may vary depending on how severe your symptoms are. Your health care provider may recommend:  Changes to your diet.  Medicine.  Surgery.  Follow these instructions at home: Diet  Follow a diet as recommended by your health care provider. This may involve avoiding foods and drinks such as: ? Coffee and tea (with or without caffeine). ? Drinks that containalcohol. ? Energy drinks and sports drinks. ? Carbonated drinks or sodas. ? Chocolate and cocoa. ? Peppermint and mint flavorings. ? Garlic and onions. ? Horseradish. ? Spicy and acidic foods, including peppers, chili powder, curry powder, vinegar, hot sauces, and barbecue sauce. ? Citrus fruit juices and citrus fruits, such as oranges, lemons, and limes. ? Tomato-based foods, such as red sauce, chili, salsa, and pizza with red sauce. ? Fried and fatty foods, such as donuts, french fries, potato chips, and high-fat dressings. ? High-fat meats, such as hot dogs and fatty cuts of red and white meats, such as rib eye steak, sausage, ham, and bacon. ? High-fat dairy items, such as whole  milk, butter, and cream cheese.  Eat small, frequent meals instead of large meals.  Avoid drinking large amounts of liquid with your meals.  Avoid eating meals during the 2-3 hours before bedtime.  Avoid lying down right after you eat.  Do not exercise right after you eat. General  instructions  Pay attention to any changes in your symptoms.  Take over-the-counter and prescription medicines only as told by your health care provider. Do not take aspirin, ibuprofen, or other NSAIDs unless your health care provider told you to do so.  Do not use any tobacco products, including cigarettes, chewing tobacco, and e-cigarettes. If you need help quitting, ask your health care provider.  Wear loose-fitting clothing. Do not wear anything tight around your waist that causes pressure on your abdomen.  Raise (elevate) the head of your bed 6 inches (15cm).  Try to reduce your stress, such as with yoga or meditation. If you need help reducing stress, ask your health care provider.  If you are overweight, reduce your weight to an amount that is healthy for you. Ask your health care provider for guidance about a safe weight loss goal.  Keep all follow-up visits as told by your health care provider. This is important. Contact a health care provider if:  You have new symptoms.  You have unexplained weight loss.  You have difficulty swallowing, or it hurts to swallow.  You have wheezing or a persistent cough.  Your symptoms do not improve with treatment.  You have a hoarse voice. Get help right away if:  You have pain in your arms, neck, jaw, teeth, or back.  You feel sweaty, dizzy, or light-headed.  You have chest pain or shortness of breath.  You vomit and your vomit looks like blood or coffee grounds.  You faint.  Your stool is bloody or black.  You cannot swallow, drink, or eat. This information is not intended to replace advice given to you by your health care provider. Make sure you discuss any questions you have with your health care provider. Document Released: 02/11/2005 Document Revised: 10/02/2015 Document Reviewed: 08/29/2014 Elsevier Interactive Patient Education  2018 [employer].      Aim for 7+ servings of fruits and vegetables daily  80+  fluid ounces of water or unsweet tea for healthy kidneys  1 drink of alcohol per day  Limit animal fats in diet for cholesterol and heart health - choose grass fed whenever available  Aim for low stress - take time to unwind and care for your mental health  Aim for 150 min of moderate intensity exercise weekly for heart health, and weights twice weekly for bone health  Aim for 7-9 hours of sleep daily   Fiber + protein + healthy fat = satisfying but weight

## 2017-08-23 ENCOUNTER — Encounter: Payer: Self-pay | Admitting: Gastroenterology

## 2017-08-23 ENCOUNTER — Ambulatory Visit (INDEPENDENT_AMBULATORY_CARE_PROVIDER_SITE_OTHER): Payer: BLUE CROSS/BLUE SHIELD | Admitting: Gastroenterology

## 2017-08-23 VITALS — BP 120/74 | HR 80 | Ht 64.75 in | Wt 195.4 lb

## 2017-08-23 DIAGNOSIS — K2100 Gastro-esophageal reflux disease with esophagitis, without bleeding: Secondary | ICD-10-CM

## 2017-08-23 DIAGNOSIS — R1319 Other dysphagia: Secondary | ICD-10-CM

## 2017-08-23 DIAGNOSIS — R1310 Dysphagia, unspecified: Secondary | ICD-10-CM

## 2017-08-23 NOTE — Patient Instructions (Addendum)
If you are age 63 or older, your body mass index should be between 23-30. Your Body mass index is 32.76 kg/m. If this is out of the aforementioned range listed, please consider follow up with your Primary Care Provider.  If you are age 85 or younger, your body mass index should be between 19-25. Your Body mass index is 32.76 kg/m. If this is out of the aformentioned range listed, please consider follow up with your Primary Care Provider.   You have been scheduled for an endoscopy. Please follow written instructions given to you at your visit today. If you use inhalers (even only as needed), please bring them with you on the day of your procedure. Your physician has requested that you go to www.startemmi.com and enter the access code given to you at your visit today. This web site gives a general overview about your procedure. However, you should still follow specific instructions given to you by our office regarding your preparation for the procedure.  Please clear with Dr Tempie Hoist for your EGD.

## 2017-08-23 NOTE — Addendum Note (Signed)
Addended by: Karena Addison on: 08/23/2017 02:41 PM   Modules accepted: Orders

## 2017-08-23 NOTE — Progress Notes (Signed)
History of Present Illness: This is a 62 year old female referred by Margaret Colonel, NP for the evaluation of GERD and dysphasia.  She has a history of an esophageal stricture and underwent EGD with dilation in April 2015.  LA class grade A esophagitis was noted.  She was maintained on Protonix for many years but discontinued about 6 months ago she has noted a return in regurgitation, primarily at night, and solid food dysphasia for the past few months.  She was recently started on Nexium by her PCP.  She has had eye surgery on March 12 recently and is recovering from that.  She has another eye surgery planned. Denies weight loss, abdominal pain, constipation, diarrhea, change in stool caliber, melena, hematochezia, nausea, vomiting, chest pain.    Allergies  Allergen Reactions  . Lyrica [Pregabalin] Swelling and Other (See Comments)    SWELLING REACTION UNSPECIFIED   . Eggs Or Egg-Derived Products Other (See Comments)    Headaches  . Sulfa Antibiotics Other (See Comments)    Headache   Outpatient Medications Prior to Visit  Medication Sig Dispense Refill  . acetaminophen (TYLENOL) 500 MG tablet Take 1,000 mg by mouth every 6 (six) hours as needed for moderate pain or headache.    Marland Kitchen aspirin-acetaminophen-caffeine (EXCEDRIN MIGRAINE) 250-250-65 MG tablet Take 1 tablet by mouth every 6 (six) hours as needed for headache.    . bacitracin-polymyxin b (POLYSPORIN) ophthalmic ointment Place 1 application into the right eye 3 (three) times daily. apply to eye every 12 hours while awake 3.5 g 0  . brimonidine (ALPHAGAN) 0.2 % ophthalmic solution Place 1 drop into the right eye 2 (two) times daily. 5 mL 12  . esomeprazole (NEXIUM) 40 MG capsule Take 1 capsule (40 mg total) by mouth daily. 30 capsule 1  . hydroxypropyl methylcellulose / hypromellose (ISOPTO TEARS / GONIOVISC) 2.5 % ophthalmic solution Place 1 drop into the left eye 4 (four) times daily. 15 mL 12  . latanoprost (XALATAN) 0.005 %  ophthalmic solution Place 1 drop into the right eye at bedtime. 2.5 mL 12  . prednisoLONE acetate (PRED FORTE) 1 % ophthalmic suspension Place 1 drop into the right eye 4 (four) times daily. 5 mL 0  . sucralfate (CARAFATE) 1 g tablet Take 1 tablet (1 g total) by mouth 4 (four) times daily -  with meals and at bedtime. 60 tablet 1  . tobramycin (TOBREX) 0.3 % ophthalmic solution Place 1 drop into the left eye every 6 (six) hours. 5 mL 0  . bacitracin-polymyxin b (POLYSPORIN) ophthalmic ointment Place 1 application into the right eye 3 (three) times daily. apply to eye every 12 hours while awake (Patient taking differently: Place 1 application into the right eye at bedtime. ) 3.5 g 0  . prednisoLONE acetate (PRED FORTE) 1 % ophthalmic suspension Place 1 drop into the right eye 4 (four) times daily. 5 mL 0   No facility-administered medications prior to visit.    Past Medical History:  Diagnosis Date  . Anxiety   . Arthritis   . B12 deficiency    hx of , no longer taking medication  . Cataracts, bilateral   . Constipation   . Depression   . Esophageal stricture   . Family history of adverse reaction to anesthesia    mom had problems waking up  . Fatty liver   . GERD (gastroesophageal reflux disease)   . Headache    migraines   . Macular hole of left eye   .  Trigeminal neuralgia    right side of face is numb  . Unspecified vitamin D deficiency    Past Surgical History:  Procedure Laterality Date  . [place] VITRECTOMY WITH 20 GAUGE MVR PORT FOR MACULAR HOLE Left 03/05/2015   Procedure: 25 GAUGE PARS PLANA VITRECTOMY WITH 20 GAUGE MVR PORT FOR MACULAR HOLE;  Surgeon: [name], MD;  Location: [place];  Service: Ophthalmology;  Laterality: Left;  . 25 GAUGE PARS PLANA VITRECTOMY WITH 20 GAUGE MVR PORT FOR MACULAR HOLE Right 06/22/2017  . 25 GAUGE PARS PLANA VITRECTOMY WITH 20 GAUGE MVR PORT FOR MACULAR HOLE Right 06/22/2017   Procedure: 25 GAUGE PARS PLANA VITRECTOMY WITH  20 GAUGE MVR PORT FOR MACULAR HOLE;  Surgeon: [name], MD;  Location: [place];  Service: Ophthalmology;  Laterality: Right;  . ABDOMINAL HYSTERECTOMY    . APPENDECTOMY    . CESAREAN SECTION    . COLONOSCOPY    . esophagus stretched    . GAS/FLUID EXCHANGE Left 03/05/2015   Procedure: GAS/FLUID EXCHANGE;  Surgeon: [name], MD;  Location: [place];  Service: Ophthalmology;  Laterality: Left;  CF38  . GAS/FLUID EXCHANGE Right 06/22/2017   Procedure: GAS/FLUID EXCHANGE;  Surgeon: [name], MD;  Location: [place];  Service: Ophthalmology;  Laterality: Right;  . GAS/FLUID EXCHANGE Right 07/27/2017   Procedure: GAS/FLUID EXCHANGE;  Surgeon: [name], MD;  Location: [place];  Service: Ophthalmology;  Laterality: Right;  . knot removed from right side of abdomen    . LASER PHOTO ABLATION Left 03/05/2015   Procedure: LASER PHOTO ABLATION;  Surgeon: [name], MD;  Location: [place];  Service: Ophthalmology;  Laterality: Left;  Headscope Laser  . LASER PHOTO ABLATION Right 06/22/2017   Procedure: LASER PHOTO ABLATION;  Surgeon: [name], MD;  Location: [place];  Service: Ophthalmology;  Laterality: Right;  . LASER PHOTO ABLATION Right 07/27/2017   Procedure: LASER PHOTO ABLATION;  Surgeon: [name], MD;  Location: [place];  Service: Ophthalmology;  Laterality: Right;  . MEMBRANE PEEL Left 03/05/2015   Procedure: MEMBRANE PEEL;  Surgeon: [name], MD;  Location: [place];  Service: Ophthalmology;  Laterality: Left;  . MEMBRANE PEEL Right 06/22/2017   Procedure: MEMBRANE PEEL;  Surgeon: [name], MD;  Location: [place];  Service: Ophthalmology;  Laterality: Right;  . MEMBRANE PEEL Right 07/27/2017   Procedure: MEMBRANE PEEL;  Surgeon: [name], MD;  Location: [place];  Service: Ophthalmology;  Laterality: Right;  . SCLERAL BUCKLE WITH POSSIBLE 25 GAUGE PARS PLANA VITRECTOMY Right 07/27/2017   Procedure: SCLERAL BUCKLE  25 GAUGE PARS PLANA VITRECTOMY, C3F8  INJECTION REPAIR OF COMPLEX TRACTION OF RETINAL DETACHMENT;  Surgeon: [name], MD;  Location: [place];  Service: Ophthalmology;  Laterality: Right;  . SERUM PATCH Left 03/05/2015   Procedure: SERUM PATCH;  Surgeon: [name], MD;  Location: [place];  Service: Ophthalmology;  Laterality: Left;  . SERUM PATCH Right 06/22/2017   Procedure: SERUM PATCH;  Surgeon: [name], MD;  Location: [place];  Service: Ophthalmology;  Laterality: Right;  . trigeminal      Social History   Socioeconomic History  . Marital status: Single    Spouse name: Not on file  . Number of children: Not on file  . Years of education: Not on file  . Highest education level: Not on file  Occupational History  . Not on file  Social Needs  . Financial resource strain: Not on file  . Food insecurity:    Worry: Not on file    Inability: Not on file  . Transportation needs:    Medical: Not on file    Non-medical: Not on file  Tobacco Use  . Smoking status: Never Smoker  . Smokeless tobacco: Never Used  Substance and Sexual Activity  . Alcohol use: No  . Drug use: No  . Sexual activity: Not on file  Lifestyle  . Physical activity:    Days per week: Not on file    Minutes per session: Not on file  . Stress: Not on file  Relationships  . Social connections:    Talks on phone: Not on file    Gets together: Not on file    Attends religious service: Not on file    Active member of club or organization: Not on file    Attends meetings of clubs or organizations: Not on file    Relationship status: Not on file  Other Topics Concern  . Not on file  Social History Narrative  . Not on file   Family History  Problem Relation Age of Onset  . Pancreatic cancer Father   . Hyperlipidemia Father   . Heart disease Father   . Hypertension Father   . Diabetes Mother   . COPD Mother   . Hyperlipidemia Mother   . Hypertension Brother   . Dementia Maternal Grandmother   . Cancer Maternal Grandfather          bone  . Cancer Maternal Aunt        BREAST  . Cancer Paternal Aunt        HODGKINS  . Cancer Paternal Uncle        BONE  . Colon cancer Paternal Uncle       Review of Systems: Pertinent positive and negative review of systems were noted in the above HPI section. All other review of systems were otherwise negative.   Physical Exam: General: Well developed, well nourished, no acute distress Head: Normocephalic and atraumatic Eyes:  sclerae anicteric, EOMI Ears: Normal auditory acuity Mouth: No deformity or lesions Neck: Supple, no masses or thyromegaly Lungs: Clear throughout to auscultation Heart: Regular rate and rhythm; no murmurs, rubs or bruits Abdomen: Soft, non tender and non distended. No masses, hepatosplenomegaly or hernias noted. Normal Bowel sounds Rectal: not done Musculoskeletal: Symmetrical with no gross deformities  Skin: No lesions on visible extremities Pulses:  Normal pulses noted Extremities: No clubbing, cyanosis, edema or deformities noted Neurological: Alert oriented x 4, grossly nonfocal Cervical Nodes:  No significant cervical adenopathy Inguinal Nodes: No significant inguinal adenopathy Psychological:  Alert and cooperative. Normal mood and affect  Assessment and Recommendations:  1.  GERD and dysphagia.  Suspected recurrent esophageal stricture.  History of erosive esophagitis.  Continue Nexium 40 mg p.o. daily.  Follow antireflux measures.  Soft diet until EGD and dilation.  Schedule EGD with dilation. The risks (including bleeding, perforation, infection, missed lesions, medication reactions and possible hospitalization or surgery if complications occur), benefits, and alternatives to endoscopy with possible biopsy and possible dilation were discussed with the patient and they consent to proceed.  Obtain clearance from her ophthalmologist, Tempie Hoist MD, for EGD/dilation with propofol sedation.   2. CRC screening, average risk.  A 10-year  interval colonoscopy is recommended in April 2025.   cc: [name], NP

## 2017-08-25 ENCOUNTER — Encounter: Payer: Self-pay | Admitting: Gastroenterology

## 2017-08-25 ENCOUNTER — Encounter (INDEPENDENT_AMBULATORY_CARE_PROVIDER_SITE_OTHER): Payer: BLUE CROSS/BLUE SHIELD | Admitting: Ophthalmology

## 2017-08-25 ENCOUNTER — Telehealth: Payer: Self-pay

## 2017-08-25 DIAGNOSIS — H338 Other retinal detachments: Secondary | ICD-10-CM

## 2017-08-25 NOTE — Telephone Encounter (Signed)
We received fax from Dr. Zigmund Daniel stating patient is cleared for EGD from their standpoint. Patient voiced understanding and Dr. Fuller Plan has reviewed.

## 2017-09-08 ENCOUNTER — Encounter: Payer: Self-pay | Admitting: Gastroenterology

## 2017-09-08 ENCOUNTER — Ambulatory Visit (AMBULATORY_SURGERY_CENTER): Payer: BLUE CROSS/BLUE SHIELD | Admitting: Gastroenterology

## 2017-09-08 ENCOUNTER — Other Ambulatory Visit: Payer: Self-pay

## 2017-09-08 VITALS — BP 126/83 | HR 72 | Temp 97.7°F | Resp 18 | Ht 64.75 in | Wt 195.0 lb

## 2017-09-08 DIAGNOSIS — K222 Esophageal obstruction: Secondary | ICD-10-CM | POA: Diagnosis not present

## 2017-09-08 DIAGNOSIS — K2100 Gastro-esophageal reflux disease with esophagitis, without bleeding: Secondary | ICD-10-CM

## 2017-09-08 DIAGNOSIS — R1310 Dysphagia, unspecified: Secondary | ICD-10-CM

## 2017-09-08 MED ORDER — SODIUM CHLORIDE 0.9 % IV SOLN: 500.0000 mL | Freq: Once | INTRAVENOUS | Status: DC

## 2017-09-08 NOTE — Progress Notes (Signed)
Called to room to assist during endoscopic procedure.  Patient ID and intended procedure confirmed with present staff. Received instructions for my participation in the procedure from the performing physician.  

## 2017-09-08 NOTE — Patient Instructions (Signed)
HANDOUTS GIVEN FOR POST DILATION DIET, HIATAL HERNIA, GERD  CLEAR LIQUIDS STARTING AT 10:30 for 2 hours, then soft diet for the rest of the day, Tomorrow you can resume regular diet.  RETURN TO THE OFFICE IN 6 WEEKS.  YOU HAD AN ENDOSCOPIC PROCEDURE TODAY AT [facility]:   Refer to the procedure report that was given to you for any specific questions about what was found during the examination.  If the procedure report does not answer your questions, please call your gastroenterologist to clarify.  If you requested that your care partner not be given the details of your procedure findings, then the procedure report has been included in a sealed envelope for you to review at your convenience later.  YOU SHOULD EXPECT: Some feelings of bloating in the abdomen. Passage of more gas than usual.  Walking can help get rid of the air that was put into your GI tract during the procedure and reduce the bloating. If you had a lower endoscopy (such as a colonoscopy or flexible sigmoidoscopy) you may notice spotting of blood in your stool or on the toilet paper. If you underwent a bowel prep for your procedure, you may not have a normal bowel movement for a few days.  Please Note:  You might notice some irritation and congestion in your nose or some drainage.  This is from the oxygen used during your procedure.  There is no need for concern and it should clear up in a day or so.  SYMPTOMS TO REPORT IMMEDIATELY:    Following upper endoscopy (EGD)  Vomiting of blood or coffee ground material  New chest pain or pain under the shoulder blades  Painful or persistently difficult swallowing  New shortness of breath  Fever of 100F or higher  Black, tarry-looking stools  For urgent or emergent issues, a gastroenterologist can be reached at any hour by calling [phone].   DIET:  We do recommend a small meal at first, but then you may proceed to your regular diet.  Drink plenty of fluids  but you should avoid alcoholic beverages for 24 hours.  ACTIVITY:  You should plan to take it easy for the rest of today and you should NOT DRIVE or use heavy machinery until tomorrow (because of the sedation medicines used during the test).    FOLLOW UP: Our staff will call the number listed on your records the next business day following your procedure to check on you and address any questions or concerns that you may have regarding the information given to you following your procedure. If we do not reach you, we will leave a message.  However, if you are feeling well and you are not experiencing any problems, there is no need to return our call.  We will assume that you have returned to your regular daily activities without incident.  If any biopsies were taken you will be contacted by phone or by letter within the next 1-3 weeks.  Please call us at [phone] if you have not heard about the biopsies in 3 weeks.    SIGNATURES/CONFIDENTIALITY: You and/or your care partner have signed paperwork which will be entered into your electronic medical record.  These signatures attest to the fact that that the information above on your After Visit Summary has been reviewed and is understood.  Full responsibility of the confidentiality of this discharge information lies with you and/or your care-partner.

## 2017-09-08 NOTE — Progress Notes (Signed)
Pt's states no medical or surgical changes since previsit or office visit. Pt has egg allergy- causes severe headaches per pt- pt does eta foods with eggs like cakes or pies- occ eats an egg itself with no issues as long as she does not eat them consistently  No soy allergy that she is aware of

## 2017-09-08 NOTE — Op Note (Signed)
[facility] Patient Name: Margaret Gilmore Procedure Date: 09/08/2017 9:21 AM MRN: [id number] Endoscopist: Ladene Artist , MD Age: 62 Referring MD:  Date of Birth: Sep 23, 1955 Gender: Female Account #: [id number] Procedure:                Upper GI endoscopy Indications:              Dysphagia, Gastroesophageal reflux disease Medicines:                Monitored Anesthesia Care Procedure:                Pre-Anesthesia Assessment:                           - Prior to the procedure, a History and Physical                            was performed, and patient medications and                            allergies were reviewed. The patient's tolerance of                            previous anesthesia was also reviewed. The risks                            and benefits of the procedure and the sedation                            options and risks were discussed with the patient.                            All questions were answered, and informed consent                            was obtained. Prior Anticoagulants: The patient has                            taken no previous anticoagulant or antiplatelet                            agents. ASA Grade Assessment: II - A patient with                            mild systemic disease. After reviewing the risks                            and benefits, the patient was deemed in                            satisfactory condition to undergo the procedure.                           After obtaining informed consent, the endoscope was  passed under direct vision. Throughout the                            procedure, the patient's blood pressure, pulse, and                            oxygen saturations were monitored continuously. The                            Endoscope was introduced through the mouth, and                            advanced to the second part of duodenum. The upper                            GI endoscopy  was accomplished without difficulty.                            The patient tolerated the procedure well. Scope In: Scope Out: Findings:                 One benign-appearing, intrinsic mild stenosis was                            found at the gastroesophageal junction. This                            stenosis measured 1.4 cm (inner diameter). The                            stenosis was traversed. A guidewire was placed and                            the scope was withdrawn. Dilations were performed                            with Savary dilators with mild resistance at 15 mm,                            16 mm and 17 mm.                           The exam of the esophagus was otherwise normal.                           A small hiatal hernia was present.                           The exam of the stomach was otherwise normal.                           The duodenal bulb and second portion of the  duodenum were normal. Complications:            No immediate complications. Estimated Blood Loss:     Estimated blood loss: none. Impression:               - Benign-appearing esophageal stenosis. Dilated.                           - Small hiatal hernia.                           - Normal duodenal bulb and second portion of the                            duodenum.                           - No specimens collected. Recommendation:           - Patient has a contact number available for                            emergencies. The signs and symptoms of potential                            delayed complications were discussed with the                            patient. Return to normal activities tomorrow.                            Written discharge instructions were provided to the                            patient.                           - Clear liquid diet for 2 hours, then advance as                            tolerated to soft diet today.                           -  Antireflux measures.                           - Continue present medications.                           - Return to GI office in 6 weeks. Ladene Artist, MD 09/08/2017 9:40:10 AM This report has been signed electronically.

## 2017-09-08 NOTE — Progress Notes (Signed)
A and O x3. Report to RN. Tolerated MAC anesthesia well.Teeth unchanged after procedure.

## 2017-09-09 ENCOUNTER — Telehealth: Payer: Self-pay

## 2017-09-09 NOTE — Telephone Encounter (Signed)
  Follow up Call-  Call back number 09/08/2017  Post procedure Call Back phone  # [phone]  Permission to leave phone message Yes  Some recent data might be hidden     Patient questions:  Do you have a fever, pain , or abdominal swelling? No. Pain Score  0 *  Have you tolerated food without any problems? Yes.    Have you been able to return to your normal activities? Yes.    Do you have any questions about your discharge instructions: Diet   No. Medications  No. Follow up visit  No.  Do you have questions or concerns about your Care? No.  Actions: * If pain score is 4 or above: No action needed, pain <4.

## 2017-11-03 ENCOUNTER — Encounter (INDEPENDENT_AMBULATORY_CARE_PROVIDER_SITE_OTHER): Payer: BLUE CROSS/BLUE SHIELD | Admitting: Ophthalmology

## 2017-11-03 DIAGNOSIS — H2511 Age-related nuclear cataract, right eye: Secondary | ICD-10-CM

## 2017-11-03 DIAGNOSIS — H338 Other retinal detachments: Secondary | ICD-10-CM

## 2017-11-03 DIAGNOSIS — H35343 Macular cyst, hole, or pseudohole, bilateral: Secondary | ICD-10-CM

## 2017-11-04 NOTE — H&P (Signed)
Margaret Gilmore is an 62 y.o. female.   Chief Complaint:loss of central vision left eye for two months HPI: had macular hole repair in 2016 left eye. Saw well for 3 years.  Hole reopened 3 months ago.  Now has central vision loss.  This surgery is second macular hole surgery left eye  Past Medical History:  Diagnosis Date  . Anxiety   . Arthritis   . B12 deficiency    hx of , no longer taking medication  . Cataracts, bilateral   . Constipation   . Depression   . Esophageal stricture   . Family history of adverse reaction to anesthesia    mom had problems waking up  . Fatty liver   . GERD (gastroesophageal reflux disease)   . Headache    migraines   . Macular hole of left eye   . Trigeminal neuralgia    right side of face is numb  . Unspecified vitamin D deficiency     Past Surgical History:  Procedure Laterality Date  . [place] VITRECTOMY WITH 20 GAUGE MVR PORT FOR MACULAR HOLE Left 03/05/2015   Procedure: 25 GAUGE PARS PLANA VITRECTOMY WITH 20 GAUGE MVR PORT FOR MACULAR HOLE;  Surgeon: [name], MD;  Location: [place];  Service: Ophthalmology;  Laterality: Left;  . 25 GAUGE PARS PLANA VITRECTOMY WITH 20 GAUGE MVR PORT FOR MACULAR HOLE Right 06/22/2017  . 25 GAUGE PARS PLANA VITRECTOMY WITH 20 GAUGE MVR PORT FOR MACULAR HOLE Right 06/22/2017   Procedure: 25 GAUGE PARS PLANA VITRECTOMY WITH 20 GAUGE MVR PORT FOR MACULAR HOLE;  Surgeon: [name], MD;  Location: [place];  Service: Ophthalmology;  Laterality: Right;  . ABDOMINAL HYSTERECTOMY    . APPENDECTOMY    . CESAREAN SECTION    . COLONOSCOPY    . esophagus stretched    . GAS/FLUID EXCHANGE Left 03/05/2015   Procedure: GAS/FLUID EXCHANGE;  Surgeon: [name], MD;  Location: [place];  Service: Ophthalmology;  Laterality: Left;  CF38  . GAS/FLUID EXCHANGE Right 06/22/2017   Procedure: GAS/FLUID EXCHANGE;  Surgeon: [name], MD;  Location: [place];  Service: Ophthalmology;  Laterality: Right;  .  GAS/FLUID EXCHANGE Right 07/27/2017   Procedure: GAS/FLUID EXCHANGE;  Surgeon: [name], MD;  Location: [place];  Service: Ophthalmology;  Laterality: Right;  . knot removed from right side of abdomen    . LASER PHOTO ABLATION Left 03/05/2015   Procedure: LASER PHOTO ABLATION;  Surgeon: [name], MD;  Location: [place];  Service: Ophthalmology;  Laterality: Left;  Headscope Laser  . LASER PHOTO ABLATION Right 06/22/2017   Procedure: LASER PHOTO ABLATION;  Surgeon: [name], MD;  Location: [place];  Service: Ophthalmology;  Laterality: Right;  . LASER PHOTO ABLATION Right 07/27/2017   Procedure: LASER PHOTO ABLATION;  Surgeon: [name], MD;  Location: [place];  Service: Ophthalmology;  Laterality: Right;  . MEMBRANE PEEL Left 03/05/2015   Procedure: MEMBRANE PEEL;  Surgeon: [name], MD;  Location: [place];  Service: Ophthalmology;  Laterality: Left;  . MEMBRANE PEEL Right 06/22/2017   Procedure: MEMBRANE PEEL;  Surgeon: [name], MD;  Location: [place];  Service: Ophthalmology;  Laterality: Right;  . MEMBRANE PEEL Right 07/27/2017   Procedure: MEMBRANE PEEL;  Surgeon: [name], MD;  Location: [place];  Service: Ophthalmology;  Laterality: Right;  . SCLERAL BUCKLE WITH POSSIBLE 25 GAUGE PARS PLANA VITRECTOMY Right 07/27/2017  Procedure: SCLERAL BUCKLE  25 GAUGE PARS PLANA VITRECTOMY, C3F8 INJECTION REPAIR OF COMPLEX TRACTION OF RETINAL DETACHMENT;  Surgeon: [name], MD;  Location: [place];  Service: Ophthalmology;  Laterality: Right;  . SERUM PATCH Left 03/05/2015   Procedure: SERUM PATCH;  Surgeon: [name], MD;  Location: [place];  Service: Ophthalmology;  Laterality: Left;  . SERUM PATCH Right 06/22/2017   Procedure: SERUM PATCH;  Surgeon: [name], MD;  Location: [place];  Service: Ophthalmology;  Laterality: Right;  . trigeminal       Family History  Problem Relation Age of Onset  . Pancreatic cancer Father   . Hyperlipidemia Father   .  Heart disease Father   . Hypertension Father   . Diabetes Mother   . COPD Mother   . Hyperlipidemia Mother   . Hypertension Brother   . Dementia Maternal Grandmother   . Cancer Maternal Grandfather        bone  . Cancer Maternal Aunt        BREAST  . Cancer Paternal Aunt        HODGKINS  . Cancer Paternal Uncle        BONE  . Colon cancer Paternal Uncle    Social History:  reports that she has never smoked. She has never used smokeless tobacco. She reports that she does not drink alcohol or use drugs.  Allergies:  Allergies  Allergen Reactions  . Benadryl [Diphenhydramine]     Severe headaches   . Lyrica [Pregabalin] Swelling and Other (See Comments)    SWELLING REACTION UNSPECIFIED   . Nitrous Oxide     Pt has gas bubble in right eye- will cause blindness per pt   . Eggs Or Egg-Derived Products Other (See Comments)    Headaches  . Sulfa Antibiotics Other (See Comments)    Headache    No medications prior to admission.    Review of systems otherwise negative  There were no vitals taken for this visit.  Physical exam: Mental status: oriented x3. Eyes: See eye exam associated with this date of surgery in media tab.  Scanned in by scanning center Ears, Nose, Throat: within normal limits Neck: Within Normal limits General: within normal limits Chest: Within normal limits Breast: deferred Heart: Within normal limits Abdomen: Within normal limits GU: deferred Extremities: within normal limits Skin: within normal limits  Assessment/Plan Recurrent macular hole left eye Plan: To [facility] for pars plana vitrectomy, membrane peel, laser, serum patch, gas injection left eye  [name] 11/04/2017, 7:26 AM  Margaret Gilmore is an 62 y.o. female.

## 2017-11-08 ENCOUNTER — Ambulatory Visit: Payer: BLUE CROSS/BLUE SHIELD | Admitting: Gastroenterology

## 2017-11-08 ENCOUNTER — Encounter (HOSPITAL_COMMUNITY): Payer: Self-pay | Admitting: *Deleted

## 2017-11-08 ENCOUNTER — Other Ambulatory Visit: Payer: Self-pay

## 2017-11-08 ENCOUNTER — Encounter: Payer: Self-pay | Admitting: Gastroenterology

## 2017-11-08 VITALS — BP 108/64 | HR 72 | Ht 64.75 in | Wt 197.2 lb

## 2017-11-08 DIAGNOSIS — R198 Other specified symptoms and signs involving the digestive system and abdomen: Secondary | ICD-10-CM

## 2017-11-08 DIAGNOSIS — R1310 Dysphagia, unspecified: Secondary | ICD-10-CM | POA: Diagnosis not present

## 2017-11-08 DIAGNOSIS — F458 Other somatoform disorders: Secondary | ICD-10-CM | POA: Diagnosis not present

## 2017-11-08 DIAGNOSIS — R0989 Other specified symptoms and signs involving the circulatory and respiratory systems: Secondary | ICD-10-CM

## 2017-11-08 NOTE — Progress Notes (Signed)
    History of Present Illness: This is a 62 year old female who complains of a sensation of something in her throat.  Symptoms are there at most times she notes the more with swallowing.  She feels the difficulty swallowing solid foods improved following EGD with dilation however her throat symptoms persist.  She notes allergies and sinus drainage.  She denies heartburn or regurgitation.  She states she notes some problems swallowing bread and rice however she has no difficulties swallowing meat.    Current Medications, Allergies, Past Medical History, Past Surgical History, Family History and Social History were reviewed in [employer] record.  Physical Exam: General: Well developed, well nourished, no acute distress Head: Normocephalic and atraumatic Eyes:  sclerae anicteric, EOMI Ears: Normal auditory acuity Mouth: No deformity or lesions Lungs: Clear throughout to auscultation Heart: Regular rate and rhythm; no murmurs, rubs or bruits Abdomen: Soft, non tender and non distended. No masses, hepatosplenomegaly or hernias noted. Normal Bowel sounds Rectal: Not done  Musculoskeletal: Symmetrical with no gross deformities  Pulses:  Normal pulses noted Extremities: No clubbing, cyanosis, edema or deformities noted Neurological: Alert oriented x 4, grossly nonfocal Psychological:  Alert and cooperative. Normal mood and affect  Assessment and Recommendations:  1.  GERD with a history of an esophageal stricture post dilation.  Dysphagia has improved.  Unclear if she has a persistent stricture.  Proceed with barium esophagram with tablet.  If that indicates persistent stricture will proceed with repeat EGD and dilation.  Continue Nexium 40 mg daily and standard antireflux measures.  2.  Globus sensation that has not responded to treatment for GERD.  Schedule ENT evaluation.

## 2017-11-08 NOTE — Patient Instructions (Addendum)
You have been scheduled for a Barium Esophogram at [facility] (1st floor of the hospital) on 11/30/17 at 9:30am. Please arrive 15 minutes prior to your appointment for registration. Make certain not to have anything to eat or drink 6 hours prior to your test. If you need to reschedule for any reason, please contact radiology at [phone] to do so. __________________________________________________________________ A barium swallow is an examination that concentrates on views of the esophagus. This tends to be a double contrast exam (barium and two liquids which, when combined, create a gas to distend the wall of the oesophagus) or single contrast (non-ionic iodine based). The study is usually tailored to your symptoms so a good history is essential. Attention is paid during the study to the form, structure and configuration of the esophagus, looking for functional disorders (such as aspiration, dysphagia, achalasia, motility and reflux) EXAMINATION You may be asked to change into a gown, depending on the type of swallow being performed. A radiologist and radiographer will perform the procedure. The radiologist will advise you of the type of contrast selected for your procedure and direct you during the exam. You will be asked to stand, sit or lie in several different positions and to hold a small amount of fluid in your mouth before being asked to swallow while the imaging is performed .In some instances you may be asked to swallow barium coated marshmallows to assess the motility of a solid food bolus. The exam can be recorded as a digital or video fluoroscopy procedure. POST PROCEDURE It will take 1-2 days for the barium to pass through your system. To facilitate this, it is important, unless otherwise directed, to increase your fluids for the next 24-48hrs and to resume your normal diet.  This test typically takes about 30 minutes to  perform. ________________________________________________________________________   Margaret Gilmore have been referred to see Dr. Janace Hoard on 11/29/17 at 9:40am. [facility] Address: [address], [place], [place] [address]  Phone: [phone]  Thank you for choosing me and [facility].  Pricilla Riffle. Dagoberto Ligas., MD., Marval Regal

## 2017-11-09 ENCOUNTER — Ambulatory Visit (HOSPITAL_COMMUNITY): Payer: BLUE CROSS/BLUE SHIELD | Admitting: Anesthesiology

## 2017-11-09 ENCOUNTER — Encounter (HOSPITAL_COMMUNITY): Payer: Self-pay

## 2017-11-09 ENCOUNTER — Encounter (HOSPITAL_COMMUNITY)
Admission: RE | Disposition: A | Discharge status: Home / Self Care | Payer: Self-pay | Source: Ambulatory Visit | Attending: Ophthalmology

## 2017-11-09 ENCOUNTER — Ambulatory Visit (HOSPITAL_COMMUNITY)
Admission: RE | Admit: 2017-11-09 | Discharge: 2017-11-10 | Disposition: A | Discharge status: Home / Self Care | Payer: BLUE CROSS/BLUE SHIELD | Source: Ambulatory Visit | Attending: Ophthalmology | Admitting: Ophthalmology

## 2017-11-09 DIAGNOSIS — K219 Gastro-esophageal reflux disease without esophagitis: Secondary | ICD-10-CM | POA: Insufficient documentation

## 2017-11-09 DIAGNOSIS — Z91012 Allergy to eggs: Secondary | ICD-10-CM | POA: Diagnosis not present

## 2017-11-09 DIAGNOSIS — H35342 Macular cyst, hole, or pseudohole, left eye: Secondary | ICD-10-CM | POA: Diagnosis present

## 2017-11-09 DIAGNOSIS — Z7952 Long term (current) use of systemic steroids: Secondary | ICD-10-CM | POA: Insufficient documentation

## 2017-11-09 DIAGNOSIS — Z888 Allergy status to other drugs, medicaments and biological substances status: Secondary | ICD-10-CM | POA: Insufficient documentation

## 2017-11-09 DIAGNOSIS — Z9889 Other specified postprocedural states: Secondary | ICD-10-CM | POA: Diagnosis not present

## 2017-11-09 DIAGNOSIS — Z79899 Other long term (current) drug therapy: Secondary | ICD-10-CM | POA: Insufficient documentation

## 2017-11-09 DIAGNOSIS — Z8249 Family history of ischemic heart disease and other diseases of the circulatory system: Secondary | ICD-10-CM | POA: Diagnosis not present

## 2017-11-09 DIAGNOSIS — Z882 Allergy status to sulfonamides status: Secondary | ICD-10-CM | POA: Insufficient documentation

## 2017-11-09 HISTORY — PX: GAS/FLUID EXCHANGE: SHX5334

## 2017-11-09 HISTORY — PX: 25 GAUGE PARS PLANA VITRECTOMY WITH 20 GAUGE MVR PORT: SHX6041

## 2017-11-09 HISTORY — PX: MEMBRANE PEEL: SHX5967

## 2017-11-09 HISTORY — PX: LASER PHOTO ABLATION: SHX5942

## 2017-11-09 HISTORY — PX: SERUM PATCH: SHX6091

## 2017-11-09 LAB — CBC
HCT: 39.9 % (ref 36.0–46.0)
Hemoglobin: 12.7 g/dL (ref 12.0–15.0)
MCH: 28.7 pg (ref 26.0–34.0)
MCHC: 31.8 g/dL (ref 30.0–36.0)
MCV: 90.1 fL (ref 78.0–100.0)
Platelets: 306 10*3/uL (ref 150–400)
RBC: 4.43 MIL/uL (ref 3.87–5.11)
RDW: 12.7 % (ref 11.5–15.5)
WBC: 5.6 10*3/uL (ref 4.0–10.5)

## 2017-11-09 LAB — COMPREHENSIVE METABOLIC PANEL
ALT: 18 U/L (ref 0–44)
AST: 16 U/L (ref 15–41)
Albumin: 3.9 g/dL (ref 3.5–5.0)
Alkaline Phosphatase: 70 U/L (ref 38–126)
Anion gap: 9 (ref 5–15)
BUN: 17 mg/dL (ref 8–23)
CO2: 23 mmol/L (ref 22–32)
Calcium: 9.2 mg/dL (ref 8.9–10.3)
Chloride: 107 mmol/L (ref 98–111)
Creatinine, Ser: 0.87 mg/dL (ref 0.44–1.00)
GFR calc Af Amer: >60 mL/min (ref >60)
GFR calc non Af Amer: >60 mL/min (ref >60)
Glucose, Bld: 102 mg/dL — ABNORMAL HIGH (ref 70–99)
Potassium: 4 mmol/L (ref 3.5–5.1)
Sodium: 139 mmol/L (ref 135–145)
Total Bilirubin: 0.8 mg/dL (ref 0.3–1.2)
Total Protein: 7 g/dL (ref 6.5–8.1)

## 2017-11-09 LAB — AUTOLOGOUS SERUM PATCH PREP

## 2017-11-09 SURGERY — 25 GAUGE PARS PLANA VITRECTOMY WITH 20 GAUGE MVR PORT
Anesthesia: General | Site: Eye | Laterality: Left

## 2017-11-09 MED ORDER — BACITRACIN-POLYMYXIN B 500-10000 UNIT/GM OP OINT
1.0000 "application " | TOPICAL_OINTMENT | Freq: Three times a day (TID) | OPHTHALMIC | Status: DC
  Filled 2017-11-09: qty 3.5

## 2017-11-09 MED ORDER — DEXAMETHASONE SODIUM PHOSPHATE 10 MG/ML IJ SOLN
INTRAMUSCULAR | Status: DC | PRN
  Administered 2017-11-09: 10 mg via INTRAVENOUS

## 2017-11-09 MED ORDER — STERILE WATER FOR INJECTION IJ SOLN
INTRAMUSCULAR | Status: DC | PRN
  Administered 2017-11-09: 20 mL

## 2017-11-09 MED ORDER — LIDOCAINE HCL (CARDIAC) PF 100 MG/5ML IV SOSY
PREFILLED_SYRINGE | INTRAVENOUS | Status: DC | PRN
  Administered 2017-11-09: 100 mg via INTRAVENOUS

## 2017-11-09 MED ORDER — ROCURONIUM BROMIDE 50 MG/5ML IV SOLN
INTRAVENOUS | Status: AC
  Filled 2017-11-09: qty 1

## 2017-11-09 MED ORDER — SODIUM CHLORIDE 0.45 % IV SOLN
INTRAVENOUS | Status: DC
  Administered 2017-11-09: 16:00:00 via INTRAVENOUS

## 2017-11-09 MED ORDER — STERILE WATER FOR INJECTION IJ SOLN
INTRAMUSCULAR | Status: AC
Start: 2017-11-09 — End: ?
  Filled 2017-11-09: qty 20

## 2017-11-09 MED ORDER — LATANOPROST 0.005 % OP SOLN
1.0000 [drp] | Freq: Every day | OPHTHALMIC | Status: DC
  Administered 2017-11-09: 1 [drp] via OPHTHALMIC
  Filled 2017-11-09: qty 2.5

## 2017-11-09 MED ORDER — BSS PLUS IO SOLN
INTRAOCULAR | Status: AC
  Filled 2017-11-09: qty 500

## 2017-11-09 MED ORDER — TRIAMCINOLONE ACETONIDE 40 MG/ML IJ SUSP
INTRAMUSCULAR | Status: AC
  Filled 2017-11-09: qty 5

## 2017-11-09 MED ORDER — HYPROMELLOSE (GONIOSCOPIC) 2.5 % OP SOLN
1.0000 [drp] | Freq: Every day | OPHTHALMIC | Status: DC | PRN
  Filled 2017-11-09: qty 15

## 2017-11-09 MED ORDER — ACETAMINOPHEN 325 MG PO TABS
325.0000 mg | ORAL_TABLET | ORAL | Status: DC | PRN
  Administered 2017-11-09 (×2): 650 mg via ORAL
  Filled 2017-11-09 (×2): qty 2

## 2017-11-09 MED ORDER — PHENYLEPHRINE 40 MCG/ML (10ML) SYRINGE FOR IV PUSH (FOR BLOOD PRESSURE SUPPORT)
PREFILLED_SYRINGE | INTRAVENOUS | Status: AC
  Filled 2017-11-09: qty 10

## 2017-11-09 MED ORDER — MIDAZOLAM HCL 2 MG/2ML IJ SOLN
INTRAMUSCULAR | Status: AC
  Filled 2017-11-09: qty 2

## 2017-11-09 MED ORDER — SODIUM HYALURONATE 10 MG/ML IO SOLN
INTRAOCULAR | Status: DC | PRN
  Administered 2017-11-09: 0.85 mL via INTRAOCULAR

## 2017-11-09 MED ORDER — DEXAMETHASONE SODIUM PHOSPHATE 10 MG/ML IJ SOLN
INTRAMUSCULAR | Status: DC | PRN
  Administered 2017-11-09: 10 mg

## 2017-11-09 MED ORDER — DEXAMETHASONE SODIUM PHOSPHATE 10 MG/ML IJ SOLN
INTRAMUSCULAR | Status: AC
  Filled 2017-11-09: qty 1

## 2017-11-09 MED ORDER — EPINEPHRINE PF 1 MG/ML IJ SOLN
INTRAMUSCULAR | Status: AC
  Filled 2017-11-09: qty 1

## 2017-11-09 MED ORDER — BUPIVACAINE HCL (PF) 0.75 % IJ SOLN
INTRAMUSCULAR | Status: AC
  Filled 2017-11-09: qty 10

## 2017-11-09 MED ORDER — SUGAMMADEX SODIUM 200 MG/2ML IV SOLN
INTRAVENOUS | Status: AC
  Filled 2017-11-09: qty 2

## 2017-11-09 MED ORDER — EPINEPHRINE PF 1 MG/ML IJ SOLN
INTRAOCULAR | Status: DC | PRN
  Administered 2017-11-09: 14:00:00

## 2017-11-09 MED ORDER — PREDNISOLONE ACETATE 1 % OP SUSP
1.0000 [drp] | Freq: Four times a day (QID) | OPHTHALMIC | Status: DC
  Filled 2017-11-09: qty 5

## 2017-11-09 MED ORDER — FENTANYL CITRATE (PF) 250 MCG/5ML IJ SOLN
INTRAMUSCULAR | Status: AC
  Filled 2017-11-09: qty 5

## 2017-11-09 MED ORDER — HYDROCODONE-ACETAMINOPHEN 5-325 MG PO TABS
1.0000 | ORAL_TABLET | ORAL | Status: DC | PRN
  Filled 2017-11-09: qty 2

## 2017-11-09 MED ORDER — MORPHINE SULFATE (PF) 2 MG/ML IV SOLN: 1.0000 mg | INTRAVENOUS | Status: DC | PRN

## 2017-11-09 MED ORDER — PHENYLEPHRINE HCL 2.5 % OP SOLN
1.0000 [drp] | OPHTHALMIC | Status: AC | PRN
  Administered 2017-11-09 (×3): 1 [drp] via OPHTHALMIC
  Filled 2017-11-09: qty 2

## 2017-11-09 MED ORDER — SODIUM CHLORIDE 0.9 % IV SOLN
INTRAVENOUS | Status: DC
  Administered 2017-11-09: 11:00:00 via INTRAVENOUS

## 2017-11-09 MED ORDER — LIDOCAINE 2% (20 MG/ML) 5 ML SYRINGE
INTRAMUSCULAR | Status: AC
  Filled 2017-11-09: qty 5

## 2017-11-09 MED ORDER — PROPOFOL 10 MG/ML IV BOLUS
INTRAVENOUS | Status: AC
  Filled 2017-11-09: qty 20

## 2017-11-09 MED ORDER — ONDANSETRON HCL 4 MG/2ML IJ SOLN
INTRAMUSCULAR | Status: AC
  Filled 2017-11-09: qty 2

## 2017-11-09 MED ORDER — MIDAZOLAM HCL 5 MG/5ML IJ SOLN
INTRAMUSCULAR | Status: DC | PRN
  Administered 2017-11-09: 1 mg via INTRAVENOUS

## 2017-11-09 MED ORDER — GATIFLOXACIN 0.5 % OP SOLN
1.0000 [drp] | Freq: Four times a day (QID) | OPHTHALMIC | Status: DC
  Filled 2017-11-09: qty 2.5

## 2017-11-09 MED ORDER — SUCRALFATE 1 G PO TABS: 1.0000 g | ORAL_TABLET | Freq: Three times a day (TID) | ORAL | Status: DC

## 2017-11-09 MED ORDER — BSS IO SOLN
INTRAOCULAR | Status: AC
  Filled 2017-11-09: qty 15

## 2017-11-09 MED ORDER — HYALURONIDASE HUMAN 150 UNIT/ML IJ SOLN
INTRAMUSCULAR | Status: AC
  Filled 2017-11-09: qty 1

## 2017-11-09 MED ORDER — FENTANYL CITRATE (PF) 100 MCG/2ML IJ SOLN
INTRAMUSCULAR | Status: DC | PRN
  Administered 2017-11-09: 50 ug via INTRAVENOUS

## 2017-11-09 MED ORDER — CEFAZOLIN SODIUM-DEXTROSE 2-4 GM/100ML-% IV SOLN
2.0000 g | INTRAVENOUS | Status: AC
  Administered 2017-11-09: 2 g via INTRAVENOUS
  Filled 2017-11-09: qty 100

## 2017-11-09 MED ORDER — TEMAZEPAM 15 MG PO CAPS: 15.0000 mg | ORAL_CAPSULE | Freq: Every evening | ORAL | Status: DC | PRN

## 2017-11-09 MED ORDER — PREDNISOLONE ACETATE 1 % OP SUSP
1.0000 [drp] | Freq: Four times a day (QID) | OPHTHALMIC | Status: DC
  Administered 2017-11-09: 1 [drp] via OPHTHALMIC
  Filled 2017-11-09: qty 5

## 2017-11-09 MED ORDER — PANTOPRAZOLE SODIUM 40 MG PO TBEC
40.0000 mg | DELAYED_RELEASE_TABLET | Freq: Every day | ORAL | Status: DC
Start: 2017-11-09 — End: 2017-11-10
  Administered 2017-11-09: 40 mg via ORAL
  Filled 2017-11-09 (×2): qty 1

## 2017-11-09 MED ORDER — GATIFLOXACIN 0.5 % OP SOLN
1.0000 [drp] | OPHTHALMIC | Status: AC | PRN
  Administered 2017-11-09 (×3): 1 [drp] via OPHTHALMIC
  Filled 2017-11-09: qty 2.5

## 2017-11-09 MED ORDER — ATROPINE SULFATE 1 % OP SOLN
OPHTHALMIC | Status: AC
  Filled 2017-11-09: qty 5

## 2017-11-09 MED ORDER — PROPOFOL 10 MG/ML IV BOLUS
INTRAVENOUS | Status: DC | PRN
  Administered 2017-11-09: 150 mg via INTRAVENOUS

## 2017-11-09 MED ORDER — CYCLOPENTOLATE HCL 1 % OP SOLN
1.0000 [drp] | OPHTHALMIC | Status: AC | PRN
  Administered 2017-11-09 (×3): 1 [drp] via OPHTHALMIC
  Filled 2017-11-09: qty 2

## 2017-11-09 MED ORDER — SUGAMMADEX SODIUM 200 MG/2ML IV SOLN
INTRAVENOUS | Status: DC | PRN
  Administered 2017-11-09: 200 mg via INTRAVENOUS

## 2017-11-09 MED ORDER — MAGNESIUM HYDROXIDE 400 MG/5ML PO SUSP: 15.0000 mL | Freq: Four times a day (QID) | ORAL | Status: DC | PRN

## 2017-11-09 MED ORDER — PHENYLEPHRINE 40 MCG/ML (10ML) SYRINGE FOR IV PUSH (FOR BLOOD PRESSURE SUPPORT)
PREFILLED_SYRINGE | INTRAVENOUS | Status: DC | PRN
  Administered 2017-11-09 (×4): 80 ug via INTRAVENOUS

## 2017-11-09 MED ORDER — BUPIVACAINE HCL (PF) 0.75 % IJ SOLN
INTRAMUSCULAR | Status: DC | PRN
  Administered 2017-11-09: 10 mL

## 2017-11-09 MED ORDER — ONDANSETRON HCL 4 MG/2ML IJ SOLN
4.0000 mg | Freq: Four times a day (QID) | INTRAMUSCULAR | Status: DC
  Administered 2017-11-09 (×2): 4 mg via INTRAVENOUS
  Filled 2017-11-09 (×3): qty 2

## 2017-11-09 MED ORDER — ACETAZOLAMIDE SODIUM 500 MG IJ SOLR
500.0000 mg | Freq: Once | INTRAMUSCULAR | Status: AC
  Administered 2017-11-10: 500 mg via INTRAVENOUS

## 2017-11-09 MED ORDER — BACITRACIN-POLYMYXIN B 500-10000 UNIT/GM OP OINT
TOPICAL_OINTMENT | OPHTHALMIC | Status: DC | PRN
  Administered 2017-11-09: 1 via OPHTHALMIC

## 2017-11-09 MED ORDER — BUPIVACAINE-EPINEPHRINE (PF) 0.25% -1:200000 IJ SOLN
INTRAMUSCULAR | Status: AC
  Filled 2017-11-09: qty 30

## 2017-11-09 MED ORDER — SODIUM CHLORIDE 0.9 % IJ SOLN
INTRAMUSCULAR | Status: AC
  Filled 2017-11-09: qty 10

## 2017-11-09 MED ORDER — ACETAMINOPHEN 500 MG PO TABS: 1000.0000 mg | ORAL_TABLET | Freq: Two times a day (BID) | ORAL | Status: DC | PRN

## 2017-11-09 MED ORDER — POLYMYXIN B SULFATE 500000 UNITS IJ SOLR
INTRAMUSCULAR | Status: AC
  Filled 2017-11-09: qty 500000

## 2017-11-09 MED ORDER — LATANOPROST 0.005 % OP SOLN
1.0000 [drp] | Freq: Every day | OPHTHALMIC | Status: DC
  Filled 2017-11-09 (×2): qty 2.5

## 2017-11-09 MED ORDER — BACITRACIN-POLYMYXIN B 500-10000 UNIT/GM OP OINT
TOPICAL_OINTMENT | OPHTHALMIC | Status: AC
  Filled 2017-11-09: qty 3.5

## 2017-11-09 MED ORDER — ACETAZOLAMIDE SODIUM 500 MG IJ SOLR
INTRAMUSCULAR | Status: AC
  Filled 2017-11-09: qty 500

## 2017-11-09 MED ORDER — BRIMONIDINE TARTRATE 0.2 % OP SOLN
1.0000 [drp] | Freq: Two times a day (BID) | OPHTHALMIC | Status: DC
  Administered 2017-11-09: 1 [drp] via OPHTHALMIC
  Filled 2017-11-09: qty 5

## 2017-11-09 MED ORDER — TROPICAMIDE 1 % OP SOLN
1.0000 [drp] | OPHTHALMIC | Status: AC | PRN
  Administered 2017-11-09 (×3): 1 [drp] via OPHTHALMIC
  Filled 2017-11-09: qty 15

## 2017-11-09 MED ORDER — ATROPINE SULFATE 1 % OP SOLN
OPHTHALMIC | Status: DC | PRN
  Administered 2017-11-09: 1 [drp] via OPHTHALMIC

## 2017-11-09 MED ORDER — BSS IO SOLN
INTRAOCULAR | Status: DC | PRN
  Administered 2017-11-09: 15 mL via INTRAOCULAR

## 2017-11-09 MED ORDER — LIDOCAINE HCL 2 % IJ SOLN
INTRAMUSCULAR | Status: AC
  Filled 2017-11-09: qty 20

## 2017-11-09 MED ORDER — ROCURONIUM BROMIDE 100 MG/10ML IV SOLN
INTRAVENOUS | Status: DC | PRN
  Administered 2017-11-09: 50 mg via INTRAVENOUS

## 2017-11-09 MED ORDER — HYPROMELLOSE (GONIOSCOPIC) 2.5 % OP SOLN
OPHTHALMIC | Status: AC
  Filled 2017-11-09: qty 15

## 2017-11-09 MED ORDER — BRIMONIDINE TARTRATE 0.2 % OP SOLN
1.0000 [drp] | Freq: Two times a day (BID) | OPHTHALMIC | Status: DC
  Filled 2017-11-09: qty 5

## 2017-11-09 MED ORDER — TETRACAINE HCL 0.5 % OP SOLN
2.0000 [drp] | Freq: Once | OPHTHALMIC | Status: DC
  Filled 2017-11-09: qty 4

## 2017-11-09 MED ORDER — BACITRACIN-POLYMYXIN B 500-10000 UNIT/GM OP OINT
1.0000 | TOPICAL_OINTMENT | Freq: Three times a day (TID) | OPHTHALMIC | Status: DC
Start: 2017-11-09 — End: 2017-11-10
  Administered 2017-11-09: 1 via OPHTHALMIC
  Filled 2017-11-09: qty 3.5

## 2017-11-09 MED ORDER — CEFTAZIDIME 1 G IJ SOLR
INTRAMUSCULAR | Status: AC
  Filled 2017-11-09: qty 1

## 2017-11-09 MED ORDER — ONDANSETRON HCL 4 MG/2ML IJ SOLN
INTRAMUSCULAR | Status: DC | PRN
  Administered 2017-11-09: 4 mg via INTRAVENOUS

## 2017-11-09 MED ORDER — DORZOLAMIDE HCL 2 % OP SOLN
1.0000 [drp] | Freq: Three times a day (TID) | OPHTHALMIC | Status: DC
  Filled 2017-11-09: qty 10

## 2017-11-09 MED ORDER — SODIUM HYALURONATE 10 MG/ML IO SOLN
INTRAOCULAR | Status: AC
  Filled 2017-11-09: qty 0.85

## 2017-11-09 SURGICAL SUPPLY — 75 items
APL SRG 3 HI ABS STRL LF PLS (MISCELLANEOUS) ×1
APPLICATOR DR MATTHEWS STRL (MISCELLANEOUS) ×1 IMPLANT
BALL CTTN LRG ABS STRL LF (GAUZE/BANDAGES/DRESSINGS) ×3
BLADE EYE CATARACT 19 1.4 BEAV (BLADE) IMPLANT
BLADE MVR KNIFE 19G (BLADE) ×1 IMPLANT
BLADE MVR KNIFE 20G (BLADE) ×1 IMPLANT
CANNULA DUAL BORE 23G (CANNULA) IMPLANT
CANNULA DUALBORE 25G (CANNULA) ×2 IMPLANT
CANNULA VLV SOFT TIP 25G (OPHTHALMIC) ×1 IMPLANT
CANNULA VLV SOFT TIP 25GA (OPHTHALMIC) ×2 IMPLANT
COTTONBALL LRG STERILE PKG (GAUZE/BANDAGES/DRESSINGS) ×6 IMPLANT
COVER MAYO STAND STRL (DRAPES) ×2 IMPLANT
DRAPE INCISE 51X51 W/FILM STRL (DRAPES) IMPLANT
DRAPE OPHTHALMIC 77X100 STRL (CUSTOM PROCEDURE TRAY) ×2 IMPLANT
EAGLE VIT/RET MICRO PIC 168 25 (MISCELLANEOUS) ×2 IMPLANT
FILTER BLUE MILLIPORE (MISCELLANEOUS) IMPLANT
FILTER STRAW FLUID ASPIR (MISCELLANEOUS) ×1 IMPLANT
FORCEPS ECKARDT ILM 25G SERR (OPHTHALMIC RELATED) IMPLANT
FORCEPS GRIESHABER ILM 25G A (INSTRUMENTS) IMPLANT
FORCEPS GRIESHABER ILM 27G (INSTRUMENTS) ×2 IMPLANT
FORCEPS ILM 25G DSP TIP (MISCELLANEOUS) IMPLANT
GAS OPHTHALMIC (MISCELLANEOUS) ×1 IMPLANT
GLOVE SS BIOGEL STRL SZ 6.5 (GLOVE) ×1 IMPLANT
GLOVE SS BIOGEL STRL SZ 7 (GLOVE) ×1 IMPLANT
GLOVE SUPERSENSE BIOGEL SZ 6.5 (GLOVE) ×1
GLOVE SUPERSENSE BIOGEL SZ 7 (GLOVE) ×1
GLOVE SURG 8.5 LATEX PF (GLOVE) ×2 IMPLANT
GOWN STRL REUS W/ TWL LRG LVL3 (GOWN DISPOSABLE) ×3 IMPLANT
GOWN STRL REUS W/TWL LRG LVL3 (GOWN DISPOSABLE) ×6
HANDLE PNEUMATIC FOR CONSTEL (OPHTHALMIC) IMPLANT
KIT BASIN OR (CUSTOM PROCEDURE TRAY) ×2 IMPLANT
KIT TURNOVER KIT B (KITS) ×2 IMPLANT
LASER PROBE ROUND  VAS (MISCELLANEOUS) ×1 IMPLANT
MICROPICK 25G (MISCELLANEOUS) ×2
NDL 18GX1X1/2 (RX/OR ONLY) (NEEDLE) ×1 IMPLANT
NDL 25GX 5/8IN NON SAFETY (NEEDLE) ×1 IMPLANT
NDL FILTER BLUNT 18X1 1/2 (NEEDLE) ×1 IMPLANT
NDL HYPO 30X.5 LL (NEEDLE) ×2 IMPLANT
NDL PRECISIONGLIDE 27X1.5 (NEEDLE) IMPLANT
NEEDLE 18GX1X1/2 (RX/OR ONLY) (NEEDLE) ×2 IMPLANT
NEEDLE 25GX 5/8IN NON SAFETY (NEEDLE) ×2 IMPLANT
NEEDLE FILTER BLUNT 18X 1/2SAF (NEEDLE) ×1
NEEDLE FILTER BLUNT 18X1 1/2 (NEEDLE) ×1 IMPLANT
NEEDLE HYPO 30X.5 LL (NEEDLE) ×4 IMPLANT
NEEDLE PRECISIONGLIDE 27X1.5 (NEEDLE) IMPLANT
NS IRRIG 1000ML POUR BTL (IV SOLUTION) ×2 IMPLANT
PACK VITRECTOMY CUSTOM (CUSTOM PROCEDURE TRAY) ×2 IMPLANT
PAD ARMBOARD 7.5X6 YLW CONV (MISCELLANEOUS) ×4 IMPLANT
PAK PIK VITRECTOMY CVS 25GA (OPHTHALMIC) ×2 IMPLANT
PENCIL BIPOLAR 25GA STR DISP (OPHTHALMIC RELATED) IMPLANT
PIC ILLUMINATED 25G (OPHTHALMIC) ×2
PICK MICROPICK 25G (MISCELLANEOUS) ×1 IMPLANT
PIK ILLUMINATED 25G (OPHTHALMIC) ×1 IMPLANT
PROBE LASER ILLUM FLEX CVD 25G (OPHTHALMIC) ×2 IMPLANT
REPL STRA BRUSH NDL (NEEDLE) IMPLANT
REPL STRA BRUSH NEEDLE (NEEDLE) ×2 IMPLANT
RESERVOIR BACK FLUSH (MISCELLANEOUS) ×1 IMPLANT
ROLLS DENTAL (MISCELLANEOUS) ×4 IMPLANT
SCRAPER DIAMOND 25GA (OPHTHALMIC RELATED) IMPLANT
SCRAPER DIAMOND DUST MEMBRANE (MISCELLANEOUS) ×2 IMPLANT
SPONGE SURGIFOAM ABS GEL 12-7 (HEMOSTASIS) ×2 IMPLANT
STOPCOCK 4 WAY LG BORE MALE ST (IV SETS) IMPLANT
SUT CHROMIC 7 0 TG140 8 (SUTURE) IMPLANT
SUT ETHILON 10 0 CS140 6 (SUTURE) IMPLANT
SUT ETHILON 9 0 TG140 8 (SUTURE) ×2 IMPLANT
SUT POLY NON ABSORB 10-0 8 STR (SUTURE) IMPLANT
SUT SILK 4 0 RB 1 (SUTURE) IMPLANT
SYR 10ML LL (SYRINGE) IMPLANT
SYR 20CC LL (SYRINGE) ×2 IMPLANT
SYR BULB 3OZ (MISCELLANEOUS) ×2 IMPLANT
SYR TB 1ML LUER SLIP (SYRINGE) ×2 IMPLANT
TOWEL NATURAL 6PK STERILE (DISPOSABLE) ×2 IMPLANT
TUBING HIGH PRESS EXTEN 6IN (TUBING) IMPLANT
WATER STERILE IRR 1000ML POUR (IV SOLUTION) ×2 IMPLANT
WIPE INSTRUMENT VISIWIPE 73X73 (MISCELLANEOUS) ×2 IMPLANT

## 2017-11-09 NOTE — Anesthesia Postprocedure Evaluation (Signed)
Anesthesia Post Note  Patient: Margaret Gilmore  Procedure(s) Performed: 25 GAUGE PARS PLANA VITRECTOMY WITH 20 GAUGE MVR PORT (Left Eye) MEMBRANE PEEL (Left Eye) GAS/FLUID EXCHANGE (Left Eye) SERUM PATCH (Left Eye) LASER PHOTO ABLATION (Left Eye)     Patient location during evaluation: PACU Anesthesia Type: General Level of consciousness: awake and alert Pain management: pain level controlled Vital Signs Assessment: post-procedure vital signs reviewed and stable Respiratory status: spontaneous breathing, nonlabored ventilation, respiratory function stable and patient connected to nasal cannula oxygen Cardiovascular status: blood pressure returned to baseline and stable Postop Assessment: no apparent nausea or vomiting Anesthetic complications: no    Last Vitals:  Vitals:   11/09/17 1500 11/09/17 1510  BP:  115/70  Pulse: 77 72  Resp: 15 15  Temp:    SpO2: 92% 93%    Last Pain:  Vitals:   11/09/17 1510  TempSrc:   PainSc: 0-No pain                 Laritza Vokes

## 2017-11-09 NOTE — Anesthesia Preprocedure Evaluation (Signed)
Anesthesia Evaluation  Patient identified by MRN, date of birth, ID band Patient awake    Reviewed: Allergy & Precautions, NPO status , Patient's Chart, lab work & pertinent test results  Airway Mallampati: II  TM Distance: >3 FB Neck ROM: Full    Dental no notable dental hx.    Pulmonary neg pulmonary ROS,    Pulmonary exam normal breath sounds clear to auscultation       Cardiovascular negative cardio ROS Normal cardiovascular exam Rhythm:Regular Rate:Normal  ECG: NSR, rate 66   Neuro/Psych  Headaches, PSYCHIATRIC DISORDERS Anxiety Depression Trigeminal neuralgia    GI/Hepatic GERD  Medicated and Controlled,Fatty liver   Endo/Other  negative endocrine ROS  Renal/GU negative Renal ROS     Musculoskeletal negative musculoskeletal ROS (+)   Abdominal (+) + obese,   Peds  Hematology negative hematology ROS (+)   Anesthesia Other Findings   Reproductive/Obstetrics                             Anesthesia Physical  Anesthesia Plan  ASA: II  Anesthesia Plan: General   Post-op Pain Management:    Induction: Intravenous  PONV Risk Score and Plan: 3 and Ondansetron, Dexamethasone, Midazolam and Treatment may vary due to age or medical condition  Airway Management Planned: Oral ETT  Additional Equipment:   Intra-op Plan:   Post-operative Plan: Extubation in OR  Informed Consent: I have reviewed the patients History and Physical, chart, labs and discussed the procedure including the risks, benefits and alternatives for the proposed anesthesia with the patient or authorized representative who has indicated his/her understanding and acceptance.   Dental advisory given  Plan Discussed with: CRNA  Anesthesia Plan Comments:         Anesthesia Quick Evaluation

## 2017-11-09 NOTE — Op Note (Signed)
NAME: Margaret Gilmore, Margaret Gilmore MEDICAL RECORD [id number] ACCOUNT [id number] DATE OF BIRTH:May 16, 1956 FACILITY: [facility] LOCATION: [facility] PHYSICIAN:JOHN D. MATTHEWS, MD  OPERATIVE REPORT  DATE OF PROCEDURE:  11/09/2017  ADMISSION DIAGNOSIS:  Macular hole, left eye, recurrent.   PROCEDURES:  Pars plana vitrectomy, membrane peel, serum patch, retinal photocoagulation, gas fluid exchange, left eye.  SURGEON:  Tempie Hoist, MD  ASSISTANT:  Deatra Ina, SA.  ANESTHESIA:  General.  DESCRIPTION OF PROCEDURE:  Usual prep and drape, 25-gauge trocars were placed at 10 and 4 o'clock, infusion at 4 o'clock.  A 3-layered scleral wound was opened at 2 o'clock to  admit 20-gauge instruments.  An MVR incision was made at 2 o'clock.  Contact  lens ring anchored into place at 6 and 12 o'clock.  Provisc placed on the corneal surface and the flat contact lens was placed.  Pars plana vitrectomy was begun just behind the crystalline lens.  Some remnants of vitreous were carefully removed and under  low suction and rapid cutting.  Attention was drawn down to the macular surface.  The surface was shiny and contained surface membranes.  The vitrectomy was carried into the far periphery with the 30 degree prismatic lens.  Additional vitreous was  removed from these regions.  The contact lenses were removed and the super wide viewing BIOM system was moved into place.  The endolaser was positioned in the eye and 805 burns were placed around the retinal periphery with the power of 350 milliwatts,  1000 microns each and 0.1 seconds each.  The magnifying contact lens was then placed onto a layer of methylcellulose and onto the corneal surface.  The attention was carried to the macular region.  The diamond dusted membrane scraper was used to engage  the internal limiting membrane and several other layers of fibrous material that were lying on the retinal surface and attached to the edges of the macular hole.  These membranes were  teased out toward the center of the hole.  The vitreous cutter was  used to remove most of the membranes; however, the edges of the macular hole were allowed to remain with membrane so that membrane could be forced into the center of the hole as an ILM plug.  Once this was accomplished, the total gas fluid exchange was  carried out.  Sufficient time was allowed for additional fluid to track down the walls of the eye and collect in the posterior segment.  During this time, the C3F8 was prepared to a 14% concentration and the serum patch was prepared.  Additional fluid  was removed from the posterior pole with a [place] ophthalmics brush.  Serum patch was delivered.  Additional serum was removed with the [place] ophthalmics brush.  A gas fluid exchange was carried out with C3F8 14%.  The instruments were removed from the  eye.  A 9-0 nylon was used to close the sclerotomy site at 2 o'clock.  The 25-gauge trocars were removed and the closing pressure was 10 with a [place] tonometer.  The wounds were tested and found to be secure.  Polymyxin and ceftazidime were rinsed  around the globe for antibiotic coverage.  Decadron 10 mg was injected into the lower subconjunctival space.  Atropine solution was applied.  Marcaine was rinsed around the globe for postoperative pain.  Polysporin ophthalmic ointment, a patch and a  shield were placed.  The patient was awakened and taken to recovery in satisfactory condition.  COMPLICATIONS:  None.    DURATION:  1 hour  15 minutes.  TN/NUANCE  D:11/09/2017 T:11/09/2017 JOB:[id number]

## 2017-11-09 NOTE — H&P (Signed)
I examined the patient today and there is no change in the medical status 

## 2017-11-09 NOTE — Anesthesia Procedure Notes (Signed)
Procedure Name: Intubation Date/Time: 11/09/2017 1:45 PM Performed by: Candis Shine, CRNA Pre-anesthesia Checklist: Patient identified, Emergency Drugs available, Suction available and Patient being monitored Patient Re-evaluated:Patient Re-evaluated prior to induction Oxygen Delivery Method: Circle System Utilized Preoxygenation: Pre-oxygenation with 100% oxygen Induction Type: IV induction Ventilation: Mask ventilation without difficulty Laryngoscope Size: Mac and 3 Grade View: Grade I Tube type: Oral Number of attempts: 1 Airway Equipment and Method: Stylet Placement Confirmation: ETT inserted through vocal cords under direct vision,  positive ETCO2 and breath sounds checked- equal and bilateral Secured at: 22 cm Tube secured with: Tape Dental Injury: Teeth and Oropharynx as per pre-operative assessment

## 2017-11-09 NOTE — Brief Op Note (Signed)
Brief Operative note   Preoperative diagnosis:  macular hole left eye Postoperative diagnosis  * No Diagnosis Codes entered *  Procedures: pars plana vitrectomy, membrane peel, laser, serum patch, gas injection, C3F8 injection left eye  Surgeon:  [name], MD...  Assistant:  Deatra Ina SA    Anesthesia: General  Specimen: none  Estimated blood loss:  1cc  Complications: none  Patient sent to PACU in good condition  Composed by [name] MD  Dictation number: [id number]

## 2017-11-09 NOTE — Progress Notes (Signed)
Green Gas Bracelet place on patients left wrist. °

## 2017-11-09 NOTE — Transfer of Care (Signed)
Immediate Anesthesia Transfer of Care Note  Patient: MILANIE ROSENFIELD  Procedure(s) Performed: 25 GAUGE PARS PLANA VITRECTOMY WITH 20 GAUGE MVR PORT (Left Eye) MEMBRANE PEEL (Left Eye) GAS/FLUID EXCHANGE (Left Eye) SERUM PATCH (Left Eye) LASER PHOTO ABLATION (Left Eye)  Patient Location: PACU  Anesthesia Type:General  Level of Consciousness: awake, alert  and oriented  Airway & Oxygen Therapy: Patient Spontanous Breathing and Patient connected to nasal cannula oxygen  Post-op Assessment: Report given to RN and Post -op Vital signs reviewed and stable  Post vital signs: Reviewed and stable  Last Vitals:  Vitals Value Taken Time  BP 122/71 11/09/2017  2:54 PM  Temp    Pulse 72 11/09/2017  2:55 PM  Resp 14 11/09/2017  2:55 PM  SpO2 92 % 11/09/2017  2:55 PM  Vitals shown include unvalidated device data.  Last Pain:  Vitals:   11/09/17 1012  TempSrc:   PainSc: 0-No pain         Complications: No apparent anesthesia complications

## 2017-11-10 ENCOUNTER — Encounter (HOSPITAL_COMMUNITY): Payer: Self-pay | Admitting: Ophthalmology

## 2017-11-10 DIAGNOSIS — H35342 Macular cyst, hole, or pseudohole, left eye: Secondary | ICD-10-CM | POA: Diagnosis not present

## 2017-11-10 MED ORDER — GATIFLOXACIN 0.5 % OP SOLN: 1.0000 [drp] | Freq: Four times a day (QID) | OPHTHALMIC | Status: DC

## 2017-11-10 MED ORDER — PREDNISOLONE ACETATE 1 % OP SUSP: 1.0000 [drp] | Freq: Four times a day (QID) | OPHTHALMIC | 0 refills | Status: DC

## 2017-11-10 MED ORDER — BRIMONIDINE TARTRATE 0.2 % OP SOLN: 1.0000 [drp] | Freq: Two times a day (BID) | OPHTHALMIC | 12 refills | Status: DC

## 2017-11-10 MED ORDER — HYPROMELLOSE (GONIOSCOPIC) 2.5 % OP SOLN: 1.0000 [drp] | Freq: Every day | OPHTHALMIC | 12 refills | Status: DC | PRN

## 2017-11-10 NOTE — Progress Notes (Addendum)
Patient discharged to home. Verbalizes understanding of all discharge instructions including discharge medications and follow up MD visits. Patient awaiting ride. Will continue to monitor.  0920- Patient discharged, transporter by daughter.

## 2017-11-10 NOTE — Progress Notes (Signed)
11/10/2017, 6:30 AM  Mental Status:  Awake, Alert, Oriented  Anterior segment: Cornea  Clear    Anterior Chamber Clear    Lens:   IOL,  Intra Ocular Pressure 20 mmHg with Tonopen  Vitreous: Clear 95%gas bubble   Retina:  Attached Good laser reaction  Impression: Excellent result Retina attached  Final Diagnosis: Active Problems:   Macular hole, left eye   Plan: start post operative eye drops.  Discharge to home.  Give post operative instructions  [name] 11/10/2017, 6:30 AM

## 2017-11-10 NOTE — Discharge Summary (Signed)
Discharge summary not needed on [facility] patients per medical records. 

## 2017-11-16 ENCOUNTER — Telehealth: Payer: Self-pay | Admitting: *Deleted

## 2017-11-16 ENCOUNTER — Other Ambulatory Visit: Payer: Self-pay | Admitting: Internal Medicine

## 2017-11-16 ENCOUNTER — Encounter (INDEPENDENT_AMBULATORY_CARE_PROVIDER_SITE_OTHER): Payer: BLUE CROSS/BLUE SHIELD | Admitting: Ophthalmology

## 2017-11-16 DIAGNOSIS — H35342 Macular cyst, hole, or pseudohole, left eye: Secondary | ICD-10-CM

## 2017-11-16 MED ORDER — FUROSEMIDE 40 MG PO TABS: ORAL_TABLET | ORAL | 0 refills | Status: DC

## 2017-11-16 NOTE — Telephone Encounter (Signed)
Patient called and said she has had eye surgery and is having edema in her ankles.  Dr Melford Aase sent in an RX for Furosemide 40 mg to take 1 daily if needed for swelling. The patient is aware.

## 2017-11-29 ENCOUNTER — Ambulatory Visit: Payer: Self-pay | Admitting: Internal Medicine

## 2017-11-30 ENCOUNTER — Ambulatory Visit (HOSPITAL_COMMUNITY)
Admission: RE | Admit: 2017-11-30 | Discharge: 2017-11-30 | Disposition: A | Discharge status: Home / Self Care | Payer: BLUE CROSS/BLUE SHIELD | Source: Ambulatory Visit | Attending: Gastroenterology | Admitting: Gastroenterology

## 2017-11-30 ENCOUNTER — Other Ambulatory Visit: Payer: Self-pay | Admitting: Otolaryngology

## 2017-11-30 DIAGNOSIS — R1310 Dysphagia, unspecified: Secondary | ICD-10-CM | POA: Diagnosis present

## 2017-11-30 DIAGNOSIS — R221 Localized swelling, mass and lump, neck: Secondary | ICD-10-CM

## 2017-11-30 DIAGNOSIS — K219 Gastro-esophageal reflux disease without esophagitis: Secondary | ICD-10-CM | POA: Diagnosis not present

## 2017-11-30 DIAGNOSIS — R0989 Other specified symptoms and signs involving the circulatory and respiratory systems: Secondary | ICD-10-CM

## 2017-11-30 DIAGNOSIS — R198 Other specified symptoms and signs involving the digestive system and abdomen: Secondary | ICD-10-CM

## 2017-12-07 ENCOUNTER — Encounter (INDEPENDENT_AMBULATORY_CARE_PROVIDER_SITE_OTHER): Payer: BLUE CROSS/BLUE SHIELD | Admitting: Ophthalmology

## 2017-12-07 DIAGNOSIS — H35342 Macular cyst, hole, or pseudohole, left eye: Secondary | ICD-10-CM

## 2017-12-09 ENCOUNTER — Encounter (INDEPENDENT_AMBULATORY_CARE_PROVIDER_SITE_OTHER): Payer: BLUE CROSS/BLUE SHIELD | Admitting: Ophthalmology

## 2017-12-10 ENCOUNTER — Ambulatory Visit
Admission: RE | Admit: 2017-12-10 | Discharge: 2017-12-10 | Disposition: A | Discharge status: Home / Self Care | Payer: BLUE CROSS/BLUE SHIELD | Source: Ambulatory Visit | Attending: Otolaryngology | Admitting: Otolaryngology

## 2017-12-10 DIAGNOSIS — R221 Localized swelling, mass and lump, neck: Secondary | ICD-10-CM

## 2017-12-10 MED ORDER — IOPAMIDOL (ISOVUE-300) INJECTION 61%
75.0000 mL | Freq: Once | INTRAVENOUS | Status: AC | PRN
  Administered 2017-12-10: 75 mL via INTRAVENOUS

## 2017-12-15 NOTE — Progress Notes (Signed)
Complete Physical  Assessment and Plan:  Margaret Gilmore was seen today for annual exam.  Diagnoses and all orders for this visit:  Encounter for routine adult health examination with abnormal findings  - mammogram - last remote -  Bone density - s/p TAH, ? Had abnormal in 2009  Vitamin D deficiency -     VITAMIN D 25 Hydroxy (Vit-D Deficiency, Fractures)  Prediabetes -     Hemoglobin A1c  Major depressive disorder in partial remission, unspecified whether recurrent ([place]) In partial remission by lifestyle, declines medications, exercise helps Lifestyle discussed: diet/exerise, sleep hygiene, stress management, hydration  Obesity (BMI 30.0-34.9) Long discussion about weight loss, diet, and exercise Recommended diet heavy in fruits and veggies and low in animal meats, cheeses, and dairy products, appropriate calorie intake Discussed appropriate weight for height Follow up at next visit  Retinal detachment with multiple breaks, right Resolved as of last check, continue to monitor by opthalmology  Macular hole, right eye ? Resolved, followed by ophthalmology  Macular hole, left eye Followed by ophthalmology  Anxiety -     TSH  Screening for cardiovascular condition -     Lipid panel -     EKG 12-Lead  Screening for hematuria or proteinuria -     Urinalysis w microscopic + reflex cultur -     Microalbumin / creatinine urine ratio  Medication management -     CBC with Differential/Platelet -     COMPLETE METABOLIC PANEL WITH GFR -     Magnesium  RBBB monitor  Discussed med's effects and SE's. Screening labs and tests as requested with regular follow-up as recommended. Over 40 minutes of exam, counseling, chart review, and complex, high level critical decision making was performed this visit.   Future Appointments  Date Time Provider [place]  02/07/2018  9:15 AM [name], MD [facility] None  12/20/2018 10:00 AM [name], NP [facility] None    HPI   62 y.o. female  presents for a complete physical and follow up for has Depression; Vitamin D deficiency; Anxiety; Prediabetes; Macular hole, left eye; Macular hole, right eye; Retinal detachment with multiple breaks, right; Obesity (BMI 30.0-34.9); and Right bundle branch block (RBBB) on their problem list. Has history of GERD and has had EGD/dilitation in 2015 and again in 2019. 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   BMI is Body mass index is 31.69 kg/m., she has been working on diet and exercise, has been doing 10000 steps daily on fitbit, and doing isogenix. She has lost 8 lb in the last 2 weeks, she would like to get down to 150 lb. Wt Readings from Last 3 Encounters:  12/16/17 189 lb (85.7 kg)  11/09/17 197 lb 4 oz (89.5 kg)  11/08/17 197 lb 4 oz (89.5 kg)   Today their BP is BP: 108/64 She does workout. She denies chest pain, shortness of breath, dizziness.   She is not on cholesterol medication and denies myalgias. Her cholesterol is at goal. The cholesterol last visit was:   Lab Results  Component Value Date   CHOL 143 01/23/2014   HDL 52 01/23/2014   LDLCALC 73 01/23/2014   TRIG 92 01/23/2014   CHOLHDL 2.8 01/23/2014   She has been working on diet and exercise for prediabetes, she is not on bASA, she is not on ACE/ARB and denies foot ulcerations, increased appetite, nausea, paresthesia of the feet, polydipsia, polyuria, vomiting and weight  loss. Last A1C in the office was:  Lab Results  Component Value Date   HGBA1C 5.8 (H) 01/23/2014   Last GFR: Lab Results  Component Value Date   GFRNONAA >60 11/09/2017   Patient is on Vitamin D supplement.   Lab Results  Component Value Date   VD25OH 63 01/23/2014      Current Medications:  Current Outpatient Medications on File Prior to Visit  Medication Sig Dispense Refill  . acetaminophen (TYLENOL) 500 MG tablet Take 1,000 mg by mouth 2 (two) times daily as  needed for moderate pain or headache.     . brimonidine (ALPHAGAN) 0.2 % ophthalmic solution Place 1 drop into the right eye 2 (two) times daily. 5 mL 12  . esomeprazole (NEXIUM) 40 MG capsule Take 1 capsule (40 mg total) by mouth daily. 30 capsule 1  . furosemide (LASIX) 40 MG tablet Take 1 tablet daily if needed for fluid retention 15 tablet 0  . gatifloxacin (ZYMAXID) 0.5 % SOLN Place 1 drop into the left eye 4 (four) times daily.    . hydroxypropyl methylcellulose / hypromellose (ISOPTO TEARS / GONIOVISC) 2.5 % ophthalmic solution Place 1 drop into the left eye daily as needed for dry eyes. 15 mL 12  . prednisoLONE acetate (PRED FORTE) 1 % ophthalmic suspension Place 1 drop into the left eye 4 (four) times daily. 5 mL 0  . sucralfate (CARAFATE) 1 g tablet Take 1 tablet (1 g total) by mouth 4 (four) times daily -  with meals and at bedtime. 60 tablet 1  . tobramycin (TOBREX) 0.3 % ophthalmic solution Place 1 drop into the left eye every 6 (six) hours. 5 mL 0  . hydroxypropyl methylcellulose / hypromellose (ISOPTO TEARS / GONIOVISC) 2.5 % ophthalmic solution Place 1 drop into the left eye 4 (four) times daily. (Patient taking differently: Place 1 drop into the left eye daily as needed for dry eyes. ) 15 mL 12   No current facility-administered medications on file prior to visit.    Allergies:  Allergies  Allergen Reactions  . Benadryl [Diphenhydramine]     Severe headaches   . Lyrica [Pregabalin] Swelling  . Nitrous Oxide     Pt has gas bubble in right eye- will cause blindness per pt   . Sulfa Antibiotics Other (See Comments)    Headache   Medical History:  She has Depression; Vitamin D deficiency; Anxiety; Prediabetes; Macular hole, left eye; Macular hole, right eye; Retinal detachment with multiple breaks, right; Obesity (BMI 30.0-34.9); and Right bundle branch block (RBBB) on their problem list. Health Maintenance:   Immunization History  Administered Date(s) Administered  . PPD  Test 05/31/2013  . Tdap 05/31/2013    Tetanus: 2015 Pneumovax: 1 year after prevnar Prevnar 55: at age 71 Flu vaccine:  Zostavax:  Pap: Remote, ? 15 years, never abnormal, s/p TAH, discussed pelvic, patient requests to defer to next year MGM: ? In the last 10 years, will schedule  DEXA: has had, ?2009,  Colonoscopy: 2015 EGD: 2019  Last Dental Exam: Dr. Luan Pulling in [place], last visit 2018 Last Eye Exam: Dr. Tempie Hoist, [facility], last visit 2019  Patient Care Team: Unk Pinto, MD as PCP - General (Internal Medicine)  Surgical History:  She has a past surgical history that includes Abdominal hysterectomy; Cesarean section; trigeminal ; Appendectomy; knot removed from right side of abdomen; Colonoscopy; 25 gauge pars plana vitrectomy with 20 gauge mvr port for macular hole (Left, 03/05/2015);  Membrane peel (Left, 03/05/2015); Serum patch (Left, 03/05/2015); Laser photo ablation (Left, 03/05/2015); Gas/fluid exchange (Left, 03/05/2015); 25 gauge pars plana vitrectomy with 20 gauge mvr port for macular hole (Right, 06/22/2017); 25 gauge pars plana vitrectomy with 20 gauge mvr port for macular hole (Right, 06/22/2017); Gas/fluid exchange (Right, 06/22/2017); Laser photo ablation (Right, 06/22/2017); Serum patch (Right, 06/22/2017); Membrane peel (Right, 06/22/2017); Scleral buckle with possible 25 gauge pars plana vitrectomy (Right, 07/27/2017); Laser photo ablation (Right, 07/27/2017); Gas/fluid exchange (Right, 07/27/2017); Membrane peel (Right, 07/27/2017); 25 gauge pars plana vitrectomy with 20 gauge mvr port (Left, 11/09/2017); Esophagogastroduodenoscopy (egd) with esophageal dilation; Eye surgery; 25 gauge pars plana vitrectomy with 20 gauge mvr port (Left, 11/09/2017); Membrane peel (Left, 11/09/2017); Gas/fluid exchange (Left, 11/09/2017); Serum patch (Left, 11/09/2017); and Laser photo ablation (Left, 11/09/2017). Family History:  Herfamily history includes Alzheimer's  disease in her maternal aunt and maternal grandmother; Bone cancer in her maternal grandfather; Breast cancer in her maternal aunt; COPD in her mother; Cancer in her paternal uncle; Colon cancer in her paternal uncle; Diabetes in her mother; Heart disease in her father; Hodgkin's lymphoma in her paternal aunt; Hyperlipidemia in her father and mother; Hypertension in her brother and father; Pancreatic cancer in her father; Prostate cancer in her maternal grandfather. Social History:  She reports that she has never smoked. She has never used smokeless tobacco. She reports that she does not drink alcohol or use drugs.  Review of Systems: Review of Systems  Constitutional: Negative for malaise/fatigue and weight loss.  HENT: Negative for hearing loss and tinnitus.   Eyes: Negative for blurred vision and double vision.  Respiratory: Negative for cough, sputum production, shortness of breath and wheezing.   Cardiovascular: Negative for chest pain, palpitations, orthopnea, claudication, leg swelling and PND.  Gastrointestinal: Negative for abdominal pain, blood in stool, constipation, diarrhea, heartburn, melena, nausea and vomiting.  Genitourinary: Negative.   Musculoskeletal: Negative for falls, joint pain and myalgias.  Skin: Negative for rash.  Neurological: Negative for dizziness, tingling, sensory change, weakness and headaches.  Endo/Heme/Allergies: Negative for polydipsia.  Psychiatric/Behavioral: Negative.  Negative for depression, memory loss, substance abuse and suicidal ideas. The patient is not nervous/anxious and does not have insomnia.   All other systems reviewed and are negative.   Physical Exam: Estimated body mass index is 31.69 kg/m as calculated from the following:   Height as of this encounter: 5' 4.75" (1.645 m).   Weight as of this encounter: 189 lb (85.7 kg). BP 108/64   Pulse 92   Temp (!) 97.2 F (36.2 C)   Ht 5' 4.75" (1.645 m)   Wt 189 lb (85.7 kg)   SpO2 97%    BMI 31.69 kg/m  General Appearance: Well nourished, in no apparent distress.  Eyes: PERRLA, EOMs, conjunctiva no swelling or erythema, defer fundal exam to opth Sinuses: No Frontal/maxillary tenderness  ENT/Mouth: Ext aud canals clear, normal light reflex with TMs without erythema, bulging. Good dentition. No erythema, swelling, or exudate on post pharynx. Tonsils not swollen or erythematous. Hearing normal.  Neck: Supple, thyroid normal. No bruits  Respiratory: Respiratory effort normal, BS equal bilaterally without rales, rhonchi, wheezing or stridor.  Cardio: RRR without murmurs, rubs or gallops. Brisk peripheral pulses without edema.  Chest: symmetric, with normal excursions and percussion.  Breasts: Symmetric, without lumps, nipple discharge, retractions.  Abdomen: Soft, nontender, no guarding, rebound, hernias, masses, or organomegaly.  Lymphatics: Non tender without lymphadenopathy.  Genitourinary: Patient requests defer Musculoskeletal: Full ROM all peripheral extremities,5/5 strength,  and normal gait.  Skin: Warm, dry without rashes, lesions, ecchymosis. 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 Normal muscle tone, no cerebellar symptoms. Sensation intact.  Psych: Awake and oriented X 3, normal affect, Insight and Judgment appropriate.   EKG: RBBB, NSCPT  Margaret Gilmore 11:51 AM [facility]

## 2017-12-16 ENCOUNTER — Encounter: Payer: Self-pay | Admitting: Adult Health

## 2017-12-16 ENCOUNTER — Ambulatory Visit (INDEPENDENT_AMBULATORY_CARE_PROVIDER_SITE_OTHER): Payer: BLUE CROSS/BLUE SHIELD | Admitting: Adult Health

## 2017-12-16 VITALS — BP 108/64 | HR 92 | Temp 97.2°F | Ht 64.75 in | Wt 189.0 lb

## 2017-12-16 DIAGNOSIS — H33021 Retinal detachment with multiple breaks, right eye: Secondary | ICD-10-CM

## 2017-12-16 DIAGNOSIS — E559 Vitamin D deficiency, unspecified: Secondary | ICD-10-CM | POA: Diagnosis not present

## 2017-12-16 DIAGNOSIS — Z0000 Encounter for general adult medical examination without abnormal findings: Secondary | ICD-10-CM | POA: Diagnosis not present

## 2017-12-16 DIAGNOSIS — E2839 Other primary ovarian failure: Secondary | ICD-10-CM

## 2017-12-16 DIAGNOSIS — Z79899 Other long term (current) drug therapy: Secondary | ICD-10-CM | POA: Diagnosis not present

## 2017-12-16 DIAGNOSIS — I10 Essential (primary) hypertension: Secondary | ICD-10-CM

## 2017-12-16 DIAGNOSIS — Z1329 Encounter for screening for other suspected endocrine disorder: Secondary | ICD-10-CM | POA: Diagnosis not present

## 2017-12-16 DIAGNOSIS — H35342 Macular cyst, hole, or pseudohole, left eye: Secondary | ICD-10-CM

## 2017-12-16 DIAGNOSIS — Z13220 Encounter for screening for lipoid disorders: Secondary | ICD-10-CM | POA: Diagnosis not present

## 2017-12-16 DIAGNOSIS — E669 Obesity, unspecified: Secondary | ICD-10-CM

## 2017-12-16 DIAGNOSIS — Z1389 Encounter for screening for other disorder: Secondary | ICD-10-CM

## 2017-12-16 DIAGNOSIS — Z136 Encounter for screening for cardiovascular disorders: Secondary | ICD-10-CM

## 2017-12-16 DIAGNOSIS — Z131 Encounter for screening for diabetes mellitus: Secondary | ICD-10-CM | POA: Diagnosis not present

## 2017-12-16 DIAGNOSIS — R7303 Prediabetes: Secondary | ICD-10-CM

## 2017-12-16 DIAGNOSIS — F324 Major depressive disorder, single episode, in partial remission: Secondary | ICD-10-CM

## 2017-12-16 DIAGNOSIS — Z0001 Encounter for general adult medical examination with abnormal findings: Secondary | ICD-10-CM

## 2017-12-16 DIAGNOSIS — Z1239 Encounter for other screening for malignant neoplasm of breast: Secondary | ICD-10-CM

## 2017-12-16 DIAGNOSIS — H35341 Macular cyst, hole, or pseudohole, right eye: Secondary | ICD-10-CM

## 2017-12-16 DIAGNOSIS — F419 Anxiety disorder, unspecified: Secondary | ICD-10-CM

## 2017-12-16 DIAGNOSIS — I4510 Unspecified right bundle-branch block: Secondary | ICD-10-CM | POA: Insufficient documentation

## 2017-12-16 NOTE — Patient Instructions (Signed)
[employer] with no obligation # [phone] Do not have to be a member Tues-Sat 10-6  [place]- free test with no obligation # [phone] MUST BE A MEMBER Call for store hours  Have had patient's get good cheaper hearing aids from [employer] The air version has good reviews.    [facility]  7 a.m.-6:30 p.m., Monday 7 a.m.-5 p.m., Tuesday-Friday Schedule an appointment by calling [phone].  [facility] Schedule an appointment by calling [phone].   Aim for 7+ servings of fruits and vegetables daily  65-80+ fluid ounces of water or unsweet tea for healthy kidneys  Limit animal fats in diet for cholesterol and heart health - choose grass fed whenever available  Aim for low stress - take time to unwind and care for your mental health  Aim for 150 min of moderate intensity exercise weekly for heart health, and weights twice weekly for bone health  Aim for 7-9 hours of sleep daily      When it comes to diets, agreement about the perfect plan isn't easy to find, even among the experts. Experts at the [employer] developed an idea known as the Healthy Eating Plate. Just imagine a plate divided into logical, healthy portions.  The emphasis is on diet quality:  Load up on vegetables and fruits - one-half of your plate: Aim for color and variety, and remember that potatoes don't count.  Go for whole grains - one-quarter of your plate: Whole wheat, barley, wheat berries, quinoa, oats, brown rice, and foods made with them. If you want pasta, go with whole wheat pasta.  Protein power - one-quarter of your plate: Fish, chicken, beans, and nuts are all healthy, versatile protein sources. Limit red meat.  The diet, however, does go beyond the plate, offering a few other suggestions.  Use healthy plant oils, such as olive, canola, soy, corn, sunflower and peanut. Check the labels, and  avoid partially hydrogenated oil, which have unhealthy trans fats.  If you're thirsty, drink water. Coffee and tea are good in moderation, but skip sugary drinks and limit milk and dairy products to one or two daily servings.  The type of carbohydrate in the diet is more important than the amount. Some sources of carbohydrates, such as vegetables, fruits, whole grains, and beans-are healthier than others.  Finally, stay active.

## 2017-12-17 LAB — COMPLETE METABOLIC PANEL WITH GFR
AG Ratio: 1.8 (calc) (ref 1.0–2.5)
ALT: 12 U/L (ref 6–29)
AST: 14 U/L (ref 10–35)
Albumin: 4.6 g/dL (ref 3.6–5.1)
Alkaline phosphatase (APISO): 71 U/L (ref 33–130)
BUN: 21 mg/dL (ref 7–25)
CO2: 26 mmol/L (ref 20–32)
Calcium: 9.5 mg/dL (ref 8.6–10.4)
Chloride: 106 mmol/L (ref 98–110)
Creat: 0.9 mg/dL (ref 0.50–0.99)
GFR, Est African American: 79 mL/min/{1.73_m2} (ref >=60)
GFR, Est Non African American: 68 mL/min/{1.73_m2} (ref >=60)
Globulin: 2.6 g/dL (calc) (ref 1.9–3.7)
Glucose, Bld: 96 mg/dL (ref 65–99)
Potassium: 4.3 mmol/L (ref 3.5–5.3)
Sodium: 142 mmol/L (ref 135–146)
Total Bilirubin: 0.4 mg/dL (ref 0.2–1.2)
Total Protein: 7.2 g/dL (ref 6.1–8.1)

## 2017-12-17 LAB — HEMOGLOBIN A1C
Hgb A1c MFr Bld: 5.6 % of total Hgb (ref <5.7)
Mean Plasma Glucose: 114 (calc)
eAG (mmol/L): 6.3 (calc)

## 2017-12-17 LAB — URINALYSIS W MICROSCOPIC + REFLEX CULTURE
Bilirubin Urine: NEGATIVE
Glucose, UA: NEGATIVE
Hgb urine dipstick: NEGATIVE
Hyaline Cast: NONE SEEN /LPF
Ketones, ur: NEGATIVE
Leukocyte Esterase: NEGATIVE
Nitrites, Initial: NEGATIVE
Protein, ur: NEGATIVE
Specific Gravity, Urine: 1.027 (ref 1.001–1.03)
pH: <=5 (ref 5.0–8.0)

## 2017-12-17 LAB — CBC WITH DIFFERENTIAL/PLATELET
Basophils Absolute: 51 cells/uL (ref 0–200)
Basophils Relative: 0.8 %
Eosinophils Absolute: 51 cells/uL (ref 15–500)
Eosinophils Relative: 0.8 %
HCT: 38.4 % (ref 35.0–45.0)
Hemoglobin: 13 g/dL (ref 11.7–15.5)
Lymphs Abs: 2118 cells/uL (ref 850–3900)
MCH: 29.3 pg (ref 27.0–33.0)
MCHC: 33.9 g/dL (ref 32.0–36.0)
MCV: 86.5 fL (ref 80.0–100.0)
MPV: 10.1 fL (ref 7.5–12.5)
Monocytes Relative: 6.4 %
Neutro Abs: 3770 cells/uL (ref 1500–7800)
Neutrophils Relative %: 58.9 %
Platelets: 326 10*3/uL (ref 140–400)
RBC: 4.44 10*6/uL (ref 3.80–5.10)
RDW: 12.7 % (ref 11.0–15.0)
Total Lymphocyte: 33.1 %
WBC mixed population: 410 cells/uL (ref 200–950)
WBC: 6.4 10*3/uL (ref 3.8–10.8)

## 2017-12-17 LAB — LIPID PANEL
Cholesterol: 180 mg/dL (ref <200)
HDL: 54 mg/dL (ref >50)
LDL Cholesterol (Calc): 104 mg/dL (calc) — ABNORMAL HIGH
Non-HDL Cholesterol (Calc): 126 mg/dL (calc) (ref <130)
Total CHOL/HDL Ratio: 3.3 (calc) (ref <5.0)
Triglycerides: 120 mg/dL (ref <150)

## 2017-12-17 LAB — MICROALBUMIN / CREATININE URINE RATIO
Creatinine, Urine: 199 mg/dL (ref 20–275)
Microalb Creat Ratio: 6 mcg/mg creat (ref <30)
Microalb, Ur: 1.1 mg/dL

## 2017-12-17 LAB — NO CULTURE INDICATED

## 2017-12-17 LAB — VITAMIN D 25 HYDROXY (VIT D DEFICIENCY, FRACTURES): Vit D, 25-Hydroxy: 37 ng/mL (ref 30–100)

## 2017-12-17 LAB — TSH: TSH: 2.37 mIU/L (ref 0.40–4.50)

## 2017-12-17 LAB — MAGNESIUM: Magnesium: 2.2 mg/dL (ref 1.5–2.5)

## 2018-01-04 ENCOUNTER — Telehealth: Payer: Self-pay

## 2018-01-04 NOTE — Telephone Encounter (Signed)
LMTCB

## 2018-01-04 NOTE — Telephone Encounter (Signed)
No answer, will continue to call

## 2018-01-04 NOTE — Telephone Encounter (Signed)
Paient walks 4-5 miles a day, does a carb/protein mix, but, when she's done, she is exhausted rather than giving her energy. Asking for advice.

## 2018-01-05 NOTE — Telephone Encounter (Signed)
Patient notified

## 2018-01-11 ENCOUNTER — Telehealth: Payer: Self-pay | Admitting: Gastroenterology

## 2018-01-11 NOTE — Telephone Encounter (Signed)
Patient has stopped her Nexium at the recommendations of a friend.  She has been having reflux and heartburn.  She is advised to resume the nexium.  She is advised to call back for any additional questions or concerns.

## 2018-01-14 ENCOUNTER — Encounter: Payer: Self-pay | Admitting: Adult Health

## 2018-02-07 ENCOUNTER — Encounter (INDEPENDENT_AMBULATORY_CARE_PROVIDER_SITE_OTHER): Payer: BLUE CROSS/BLUE SHIELD | Admitting: Ophthalmology

## 2018-02-07 DIAGNOSIS — H35342 Macular cyst, hole, or pseudohole, left eye: Secondary | ICD-10-CM

## 2018-02-08 ENCOUNTER — Ambulatory Visit: Payer: BLUE CROSS/BLUE SHIELD

## 2018-02-08 ENCOUNTER — Other Ambulatory Visit: Payer: BLUE CROSS/BLUE SHIELD

## 2018-04-12 ENCOUNTER — Encounter (INDEPENDENT_AMBULATORY_CARE_PROVIDER_SITE_OTHER): Payer: BLUE CROSS/BLUE SHIELD | Admitting: Ophthalmology

## 2018-04-12 DIAGNOSIS — H35343 Macular cyst, hole, or pseudohole, bilateral: Secondary | ICD-10-CM | POA: Diagnosis not present

## 2018-04-12 DIAGNOSIS — H338 Other retinal detachments: Secondary | ICD-10-CM | POA: Diagnosis not present

## 2018-05-16 ENCOUNTER — Ambulatory Visit: Payer: BLUE CROSS/BLUE SHIELD | Admitting: Internal Medicine

## 2018-05-16 ENCOUNTER — Encounter: Payer: Self-pay | Admitting: Internal Medicine

## 2018-05-16 VITALS — BP 120/62 | HR 68 | Temp 97.3°F | Resp 16 | Ht 64.75 in | Wt 183.8 lb

## 2018-05-16 DIAGNOSIS — S29012A Strain of muscle and tendon of back wall of thorax, initial encounter: Secondary | ICD-10-CM

## 2018-05-16 DIAGNOSIS — M170 Bilateral primary osteoarthritis of knee: Secondary | ICD-10-CM

## 2018-05-16 MED ORDER — CYCLOBENZAPRINE HCL 10 MG PO TABS: ORAL_TABLET | ORAL | 0 refills | Status: DC

## 2018-05-16 MED ORDER — PREDNISONE 20 MG PO TABS: ORAL_TABLET | ORAL | 0 refills | Status: DC

## 2018-05-16 NOTE — Patient Instructions (Signed)
Thoracic Strain    A thoracic strain, which is sometimes called a mid-back strain, is an injury to the muscles or tendons that attach to the upper part of your back behind your chest. This type of injury occurs when a muscle is overstretched or overloaded.  Thoracic strains can range from mild to severe. Mild strains may involve stretching a muscle or tendon without tearing it. These injuries may heal in 1-2 weeks. More severe strains involve tearing of muscle fibers or tendons. These will cause more pain and may take 6-8 weeks to heal.  What are the causes?  This condition may be caused by:  · An injury in which a sudden force is placed on the muscle.  · Exercising without properly warming up.  · Overuse of the muscle.  · Improper form during certain movements.  · Other injuries that surround or cause stress on the mid-back, causing a strain on the muscles.  In some cases, the cause may not be known.  What increases the risk?  This injury is more common in:  · Athletes.  · People with obesity.  What are the signs or symptoms?  The main symptom of this condition is pain, especially with movement. Other symptoms include:  · Bruising.  · Swelling.  · Spasm.  How is this diagnosed?  This condition may be diagnosed with a physical exam. X-rays may be taken to check for a fracture.  How is this treated?  This condition may be treated with:  · Resting and icing the injured area.  · Physical therapy. This will involve doing stretching and strengthening exercises.  · Medicines for pain and inflammation.  Follow these instructions at home:  · Rest as needed. Follow instructions from your health care provider about any restrictions on activity.  · If directed, apply ice to the injured area:  ? Put ice in a plastic bag.  ? Place a towel between your skin and the bag.  ? Leave the ice on for 20 minutes, 2-3 times per day.  · Take over-the-counter and prescription medicines only as told by your health care provider.  · Begin  doing exercises as told by your health care provider or physical therapist.  · Always warm up properly before physical activity or sports.  · Bend your knees before you lift heavy objects.  · Keep all follow-up visits as told by your health care provider. This is important.  Contact a health care provider if:  · Your pain is not helped by medicine.  · Your pain, bruising, or swelling is getting worse.  · You have a fever.  Get help right away if:  · You have shortness of breath.  · You have chest pain.  · You develop numbness or weakness in your legs.  · You have involuntary loss of urine (urinary incontinence).  This information is not intended to replace advice given to you by your health care provider. Make sure you discuss any questions you have with your health care provider.  Document Released: 07/25/2003 Document Revised: 01/04/2016 Document Reviewed: 06/28/2014  Elsevier Interactive Patient Education © 2019 Elsevier Inc.

## 2018-05-16 NOTE — Progress Notes (Signed)
  Subjective:    Patient ID: Margaret Gilmore, female    DOB: October 20, 1955, 62 y.o.   MRN: [id number]  HPI   Patient is a very nice 62 yo with hx/o obesity, prediabetes , Depression and GERD who presents with recent onset of upper thoracic pain /discomfort in the inter scapular area. Denies injury. Denies any identified alleviating or aggravating factors. Also is c/o knee pains.  Outpatient Encounter Medications as of 05/16/2018  Medication Sig  . acetaminophen  500 MG tablet Take 1,000 mg  2 times daily as needed for moderate pain   . esomeprazole 40 MG capsule Take 1 capsule  daily.  . furosemide  40 MG tablet Take 1 tablet daily if needed for fluid retention   Allergies  Allergen Reactions  . Benadryl [Diphenhydramine]     Severe headaches   . Lyrica [Pregabalin] Swelling  . Nitrous Oxide     Pt has gas bubble in right eye- will cause blindness per pt   . Sulfa Antibiotics Other (See Comments)    Headache   Past Medical History:  Diagnosis Date  . Anxiety    situational  . Arthritis   . B12 deficiency    hx of , no longer taking medication  . Cataracts, bilateral   . Constipation   . Depression    situational  . Esophageal stricture   . Family history of adverse reaction to anesthesia    mom had problems waking up  . Fatty liver   . GERD (gastroesophageal reflux disease)   . Headache    migraines   . Macular hole of left eye   . Trigeminal neuralgia    right side of face is numb  . Unspecified vitamin D deficiency        10 point systems review negative except as above.    Objective:   Physical Exam  BP 120/62   Pulse 68   Temp (!) 97.3 F (36.3 C)   Resp 16   Ht 5' 4.75" (1.645 m)   Wt 183 lb 12.8 oz (83.4 kg)   BMI 30.82 kg/m   HEENT - WNL. Neck - supple.  Chest - Clear equal BS. (+) tender para-thoracis spasm at the T7-T8 interscapular area.  Cor - Nl HS. RRR w/o sig MGR. PP 1(+). No edema. MS- FROM w/o deformities.  Crepitus Rt > Lt knees. No synovitis  /effusion. Gait Nl. Neuro -  Nl w/o focal abnormalities.    Assessment & Plan:   1. Strain of thoracic back region  - predniSONE (DELTASONE) 20 MG tablet; 1 tab 3 x day for 3 days, then 1 tab 2 x day for 3 days, then 1 tab 1 x day for 5 days  Dispense: 20 tablet; Refill: 0  - cyclobenzaprine (FLEXERIL) 10 MG tablet; Take 1/2 to 1 tablet 3 x /day as needed for Muscle Spasm  Dispense: 90 tablet; Refill: 0  2. Primary osteoarthritis of both knees  - advised see an orthopedist -which she declines at present due to very high insurance deductible.

## 2018-10-12 ENCOUNTER — Encounter (INDEPENDENT_AMBULATORY_CARE_PROVIDER_SITE_OTHER): Payer: BLUE CROSS/BLUE SHIELD | Admitting: Ophthalmology

## 2018-12-20 ENCOUNTER — Encounter: Payer: Self-pay | Admitting: Adult Health

## 2019-08-11 ENCOUNTER — Encounter (INDEPENDENT_AMBULATORY_CARE_PROVIDER_SITE_OTHER): Payer: BLUE CROSS/BLUE SHIELD | Admitting: Ophthalmology

## 2019-09-01 ENCOUNTER — Ambulatory Visit: Payer: 59

## 2019-09-01 ENCOUNTER — Other Ambulatory Visit: Payer: Self-pay

## 2019-09-01 ENCOUNTER — Ambulatory Visit (INDEPENDENT_AMBULATORY_CARE_PROVIDER_SITE_OTHER): Payer: 59

## 2019-09-01 ENCOUNTER — Ambulatory Visit (INDEPENDENT_AMBULATORY_CARE_PROVIDER_SITE_OTHER): Payer: 59 | Admitting: Podiatry

## 2019-09-01 ENCOUNTER — Encounter: Payer: Self-pay | Admitting: Podiatry

## 2019-09-01 DIAGNOSIS — M779 Enthesopathy, unspecified: Secondary | ICD-10-CM | POA: Diagnosis not present

## 2019-09-01 DIAGNOSIS — M722 Plantar fascial fibromatosis: Secondary | ICD-10-CM | POA: Diagnosis not present

## 2019-09-01 MED ORDER — DICLOFENAC SODIUM 75 MG PO TBEC: 75.0000 mg | DELAYED_RELEASE_TABLET | Freq: Two times a day (BID) | ORAL | 2 refills | Status: DC

## 2019-09-01 NOTE — Progress Notes (Signed)
Subjective:   Patient ID: Margaret Gilmore, female   DOB: 64 y.o.   MRN: [id number]   HPI Patient states she has had pain in her right ankle right over left and its been going on now for several months.  She is working a full weightbearing job on [employer] and does a lot of standing and the pain is intensified since then and she does not remember any injury or other pathology.  Patient states that it is quite sore with palpation and patient does not smoke likes to be active   Review of Systems  All other systems reviewed and are negative.       Objective:  Physical Exam Vitals and nursing note reviewed.  Constitutional:      Appearance: She is well-developed.  Pulmonary:     Effort: Pulmonary effort is normal.  Musculoskeletal:        General: Normal range of motion.  Skin:    General: Skin is warm.  Neurological:     Mental Status: She is alert.     Neurovascular status found to be intact muscle strength is adequate range of motion within normal limits with some splinting of the subtalar joint bilateral.  Patient is noted to have acute inflammation of the sinus tarsi right over left with inflammation fluid which extends into the lateral ankle gutter bilateral.  Mild depression of the arch and patient has gotten inserts from chiropractor which have not been helpful currently.  Patient has good digital perfusion well oriented x3     Assessment:  Probability for acute sinus tarsitis right over left with inflammation fluid buildup along with possibility for subtalar joint arthritis right over left     Plan:  H&P conditions reviewed and will get a focus on the right foot today.  I did sterile prep injected the sinus tarsi deep capsule 3 mg Kenalog 5 mg Xylocaine and took 1 cc into the lateral ankle gutter.  I then applied fascial brace to hold up the arch gave instructions for anti-inflammatories and placed on diclofenac 75 mg twice daily and reappoint to recheck in the next  several weeks  X-rays indicate that there is mild depression of the arch no indications of acute arthritis stress fracture

## 2019-09-15 ENCOUNTER — Ambulatory Visit (INDEPENDENT_AMBULATORY_CARE_PROVIDER_SITE_OTHER): Payer: 59 | Admitting: Podiatry

## 2019-09-15 ENCOUNTER — Other Ambulatory Visit: Payer: Self-pay

## 2019-09-15 ENCOUNTER — Encounter: Payer: Self-pay | Admitting: Podiatry

## 2019-09-15 DIAGNOSIS — M779 Enthesopathy, unspecified: Secondary | ICD-10-CM

## 2019-09-15 NOTE — Progress Notes (Signed)
Subjective:   Patient ID: Michaell Cowing, female   DOB: 64 y.o.   MRN: [id number]   HPI Patient states my right 1 is doing well in my left when he started to hurt both in the ankle and also on top of the foot patient states it is bothersome and she is very pleased so far with the right   ROS      Objective:  Physical Exam  Neurovascular status intact with quite a bit of inflammation sinus tarsi left and moderate forefoot inflammation in the metatarsals low-grade with the right doing much better after treatment     Assessment:  Sinus tarsitis left present with possibility for low-grade inflammation or slight possibility for stress fracture left     Plan:  H&P reviewed conditions and at this point we will get a focus just on the sinus tarsi did sterile prep injected the sinus tarsi 3 mg Kenalog 5 mg Xylocaine advised on anti-inflammatories and fascial bracing and supportive shoes and reappoint as needed

## 2019-11-13 ENCOUNTER — Encounter: Payer: Self-pay | Admitting: Podiatry

## 2019-11-13 ENCOUNTER — Ambulatory Visit (INDEPENDENT_AMBULATORY_CARE_PROVIDER_SITE_OTHER): Payer: 59 | Admitting: Podiatry

## 2019-11-13 ENCOUNTER — Other Ambulatory Visit: Payer: Self-pay

## 2019-11-13 VITALS — Temp 98.0°F

## 2019-11-13 DIAGNOSIS — R600 Localized edema: Secondary | ICD-10-CM

## 2019-11-13 DIAGNOSIS — M722 Plantar fascial fibromatosis: Secondary | ICD-10-CM | POA: Diagnosis not present

## 2019-11-16 NOTE — Progress Notes (Signed)
Subjective:   Patient ID: Margaret Gilmore, female   DOB: 64 y.o.   MRN: [id number]   HPI Patient states the left foot and ankle feel quite a bit better but the right heel is quite sore and it is making it hard for her to be active.  Patient does not remember other issues    ROS      Objective:  Physical Exam  Neurovascular status intact muscle strength adequate with discomfort right heel with inflammation fluid around the medial band     Assessment:  Acute plantar fasciitis right that is inflamed     Plan:  H&P done today I did sterile prep injected the fascia 3 mg Kenalog 5 mg Xylocaine advised on anti-inflammatories and physical therapy and patient will be seen back for [place] to recheck again.  Patient overall is doing well and should respond well to medications and we will refill her diclofenac today

## 2019-11-24 ENCOUNTER — Ambulatory Visit: Payer: 59 | Admitting: Podiatry

## 2019-12-25 ENCOUNTER — Encounter: Payer: BLUE CROSS/BLUE SHIELD | Admitting: Adult Health

## 2020-01-30 ENCOUNTER — Encounter (INDEPENDENT_AMBULATORY_CARE_PROVIDER_SITE_OTHER): Payer: BC Managed Care – PPO | Admitting: Ophthalmology

## 2020-01-30 ENCOUNTER — Other Ambulatory Visit: Payer: Self-pay

## 2020-01-30 DIAGNOSIS — H35343 Macular cyst, hole, or pseudohole, bilateral: Secondary | ICD-10-CM

## 2020-01-30 DIAGNOSIS — H26493 Other secondary cataract, bilateral: Secondary | ICD-10-CM | POA: Diagnosis not present

## 2020-01-30 DIAGNOSIS — H338 Other retinal detachments: Secondary | ICD-10-CM

## 2020-02-16 ENCOUNTER — Other Ambulatory Visit: Payer: Self-pay

## 2020-02-16 ENCOUNTER — Encounter (INDEPENDENT_AMBULATORY_CARE_PROVIDER_SITE_OTHER): Payer: BC Managed Care – PPO | Admitting: Ophthalmology

## 2020-02-16 DIAGNOSIS — H2702 Aphakia, left eye: Secondary | ICD-10-CM

## 2020-05-01 ENCOUNTER — Encounter: Payer: Self-pay | Admitting: Emergency Medicine

## 2020-05-01 ENCOUNTER — Other Ambulatory Visit: Payer: Self-pay

## 2020-05-01 ENCOUNTER — Ambulatory Visit
Admission: EM | Admit: 2020-05-01 | Discharge: 2020-05-01 | Disposition: A | Discharge status: Home / Self Care | Payer: Medicare Other | Attending: Physician Assistant | Admitting: Physician Assistant

## 2020-05-01 DIAGNOSIS — H9201 Otalgia, right ear: Secondary | ICD-10-CM | POA: Diagnosis not present

## 2020-05-01 MED ORDER — AMOXICILLIN 875 MG PO TABS: 875.0000 mg | ORAL_TABLET | Freq: Two times a day (BID) | ORAL | 0 refills | Status: DC

## 2020-05-01 NOTE — ED Triage Notes (Signed)
Pt had some pain to RT jaw on Monday , yesterday she began to have RT ear pain that radiates to the RT side of her neck.

## 2020-05-01 NOTE — Discharge Instructions (Signed)
Schedule to see the ENT for evaluation

## 2020-05-05 NOTE — ED Provider Notes (Signed)
[facility]    CSN: [id number] Arrival date & time: 05/01/20  1512      History   Chief Complaint No chief complaint on file.   HPI Margaret Gilmore is a 64 y.o. female.   The history is provided by the patient. No language interpreter was used.  Otalgia Location:  Right Behind ear:  No abnormality Quality:  Aching Severity:  Moderate Onset quality:  Gradual Duration:  2 days Timing:  Constant Progression:  Worsening Chronicity:  New Relieved by:  Nothing Worsened by:  Nothing Ineffective treatments:  None tried Associated symptoms: no cough and no fever   Pt complains of right ear pain.  Pt has a history of trigeminal neuralgia   Past Medical History:  Diagnosis Date  . Anxiety    situational  . Arthritis   . B12 deficiency    hx of , no longer taking medication  . Cataracts, bilateral   . Constipation   . Depression    situational  . Esophageal stricture   . Family history of adverse reaction to anesthesia    mom had problems waking up  . Fatty liver   . GERD (gastroesophageal reflux disease)   . Headache    migraines   . Macular hole of left eye   . Trigeminal neuralgia    right side of face is numb  . Unspecified vitamin D deficiency     Patient Active Problem List   Diagnosis Date Noted  . Right bundle branch block (RBBB) 12/16/2017  . Obesity (BMI 30.0-34.9) 08/19/2017  . Retinal detachment with multiple breaks, right 07/27/2017  . Macular hole, right eye 06/22/2017  . Macular hole, left eye 02/11/2015  . Prediabetes 01/23/2014  . Vitamin D deficiency   . Anxiety   . Depression     Past Surgical History:  Procedure Laterality Date  . [place] VITRECTOMY WITH 20 GAUGE MVR PORT Left 11/09/2017  . 25 GAUGE PARS PLANA VITRECTOMY WITH 20 GAUGE MVR PORT Left 11/09/2017   Procedure: 25 GAUGE PARS PLANA VITRECTOMY WITH 20 GAUGE MVR PORT;  Surgeon: [name], MD;  Location: [place];  Service: Ophthalmology;   Laterality: Left;  . 25 GAUGE PARS PLANA VITRECTOMY WITH 20 GAUGE MVR PORT FOR MACULAR HOLE Left 03/05/2015   Procedure: 25 GAUGE PARS PLANA VITRECTOMY WITH 20 GAUGE MVR PORT FOR MACULAR HOLE;  Surgeon: [name], MD;  Location: [place];  Service: Ophthalmology;  Laterality: Left;  . 25 GAUGE PARS PLANA VITRECTOMY WITH 20 GAUGE MVR PORT FOR MACULAR HOLE Right 06/22/2017  . 25 GAUGE PARS PLANA VITRECTOMY WITH 20 GAUGE MVR PORT FOR MACULAR HOLE Right 06/22/2017   Procedure: 25 GAUGE PARS PLANA VITRECTOMY WITH 20 GAUGE MVR PORT FOR MACULAR HOLE;  Surgeon: [name], MD;  Location: [place];  Service: Ophthalmology;  Laterality: Right;  . ABDOMINAL HYSTERECTOMY     Total  . APPENDECTOMY    . CESAREAN SECTION    . COLONOSCOPY    . ESOPHAGOGASTRODUODENOSCOPY (EGD) WITH ESOPHAGEAL DILATION    . EYE SURGERY    . GAS/FLUID EXCHANGE Left 03/05/2015   Procedure: GAS/FLUID EXCHANGE;  Surgeon: [name], MD;  Location: [place];  Service: Ophthalmology;  Laterality: Left;  [id number]  . GAS/FLUID EXCHANGE Right 06/22/2017   Procedure: GAS/FLUID EXCHANGE;  Surgeon: [name], MD;  Location: [place];  Service: Ophthalmology;  Laterality: Right;  . GAS/FLUID EXCHANGE Right 07/27/2017   Procedure: GAS/FLUID EXCHANGE;  Surgeon: [name], MD;  Location: [place];  Service: Ophthalmology;  Laterality: Right;  . GAS/FLUID EXCHANGE Left 11/09/2017   Procedure: GAS/FLUID EXCHANGE;  Surgeon: [name], MD;  Location: [place];  Service: Ophthalmology;  Laterality: Left;  . knot removed from right side of abdomen    . LASER PHOTO ABLATION Left 03/05/2015   Procedure: LASER PHOTO ABLATION;  Surgeon: [name], MD;  Location: [place];  Service: Ophthalmology;  Laterality: Left;  Headscope Laser  . LASER PHOTO ABLATION Right 06/22/2017   Procedure: LASER PHOTO ABLATION;  Surgeon: [name], MD;  Location: [place];  Service: Ophthalmology;  Laterality: Right;  . LASER PHOTO ABLATION Right 07/27/2017    Procedure: LASER PHOTO ABLATION;  Surgeon: [name], MD;  Location: [place];  Service: Ophthalmology;  Laterality: Right;  . LASER PHOTO ABLATION Left 11/09/2017   Procedure: LASER PHOTO ABLATION;  Surgeon: [name], MD;  Location: [place];  Service: Ophthalmology;  Laterality: Left;  . MEMBRANE PEEL Left 03/05/2015   Procedure: MEMBRANE PEEL;  Surgeon: [name], MD;  Location: [place];  Service: Ophthalmology;  Laterality: Left;  . MEMBRANE PEEL Right 06/22/2017   Procedure: MEMBRANE PEEL;  Surgeon: [name], MD;  Location: [place];  Service: Ophthalmology;  Laterality: Right;  . MEMBRANE PEEL Right 07/27/2017   Procedure: MEMBRANE PEEL;  Surgeon: [name], MD;  Location: [place];  Service: Ophthalmology;  Laterality: Right;  . MEMBRANE PEEL Left 11/09/2017   Procedure: MEMBRANE PEEL;  Surgeon: [name], MD;  Location: [place];  Service: Ophthalmology;  Laterality: Left;  . SCLERAL BUCKLE WITH POSSIBLE 25 GAUGE PARS PLANA VITRECTOMY Right 07/27/2017   Procedure: SCLERAL BUCKLE  25 GAUGE PARS PLANA VITRECTOMY, C3F8 INJECTION REPAIR OF COMPLEX TRACTION OF RETINAL DETACHMENT;  Surgeon: [name], MD;  Location: [place];  Service: Ophthalmology;  Laterality: Right;  . SERUM PATCH Left 03/05/2015   Procedure: SERUM PATCH;  Surgeon: [name], MD;  Location: [place];  Service: Ophthalmology;  Laterality: Left;  . SERUM PATCH Right 06/22/2017   Procedure: SERUM PATCH;  Surgeon: [name], MD;  Location: [place];  Service: Ophthalmology;  Laterality: Right;  . SERUM PATCH Left 11/09/2017   Procedure: SERUM PATCH;  Surgeon: [name], MD;  Location: [place];  Service: Ophthalmology;  Laterality: Left;  . trigeminal       OB History   No obstetric history on file.      Home Medications    Prior to Admission medications   Medication Sig Start Date End Date Taking? Authorizing Provider  acetaminophen (TYLENOL) 500 MG tablet Take 1,000 mg by mouth 2  (two) times daily as needed for moderate pain or headache.     [provider]  amoxicillin (AMOXIL) 875 MG tablet Take 1 tablet (875 mg total) by mouth 2 (two) times daily. 05/01/20   Fransico Meadow, PA-C  diclofenac (VOLTAREN) 75 MG EC tablet Take 1 tablet (75 mg total) by mouth 2 (two) times daily. 09/01/19   Wallene Huh, DPM  esomeprazole (NEXIUM) 40 MG capsule Take 1 capsule (40 mg total) by mouth daily. 08/19/17 08/19/18  [name], NP    Family History Family History  Problem Relation Age of Onset  . Pancreatic cancer Father   . Hyperlipidemia Father   . Heart disease Father   . Hypertension Father   . Diabetes Mother   . COPD Mother   .  Hyperlipidemia Mother   . Hypertension Brother   . Alzheimer's disease Maternal Grandmother   . Bone cancer Maternal Grandfather        metz from prostate  . Prostate cancer Maternal Grandfather   . Breast cancer Maternal Aunt        BREAST  . Alzheimer's disease Maternal Aunt   . Hodgkin's lymphoma Paternal Aunt        HODGKINS  . Colon cancer Paternal Uncle   . Cancer Paternal Uncle        4 different kinds, can't recall what    Social History Social History   Tobacco Use  . Smoking status: Never Smoker  . Smokeless tobacco: Never Used  Vaping Use  . Vaping Use: Never used  Substance Use Topics  . Alcohol use: No  . Drug use: No     Allergies   Benadryl [diphenhydramine], Lyrica [pregabalin], Nitrous oxide, and Sulfa antibiotics   Review of Systems Review of Systems  Constitutional: Negative for fever.  HENT: Positive for ear pain.   Respiratory: Negative for cough.   All other systems reviewed and are negative.    Physical Exam Triage Vital Signs ED Triage Vitals  Enc Vitals Group     BP 05/01/20 1537 113/76     Pulse Rate 05/01/20 1537 87     Resp 05/01/20 1537 18     Temp 05/01/20 1537 98.2 F (36.8 C)     Temp Source 05/01/20 1537 Oral     SpO2 05/01/20 1537 98 %     Weight 05/01/20 1535  202 lb (91.6 kg)     Height 05/01/20 1535 5\' 5"  (1.651 m)     Head Circumference --      Peak Flow --      Pain Score 05/01/20 1534 3     Pain Loc --      Pain Edu? --      Excl. in [place]? --    No data found.  Updated Vital Signs BP 113/76 (BP Location: Right Arm)   Pulse 87   Temp 98.2 F (36.8 C) (Oral)   Resp 18   Ht 5\' 5"  (1.651 m)   Wt 91.6 kg   SpO2 98%   BMI 33.61 kg/m   Visual Acuity Right Eye Distance:   Left Eye Distance:   Bilateral Distance:    Right Eye Near:   Left Eye Near:    Bilateral Near:     Physical Exam Vitals and nursing note reviewed.  Constitutional:      Appearance: She is well-developed and well-nourished.  HENT:     Head: Normocephalic.     Ears:     Comments: Tm dull, tender neck  Eyes:     Extraocular Movements: EOM normal.  Cardiovascular:     Rate and Rhythm: Normal rate and regular rhythm.     Pulses: Normal pulses.  Pulmonary:     Effort: Pulmonary effort is normal.  Abdominal:     General: There is no distension.  Musculoskeletal:        General: Normal range of motion.     Cervical back: Normal range of motion.  Skin:    General: Skin is warm.  Neurological:     General: No focal deficit present.     Mental Status: She is alert and oriented to person, place, and time.  Psychiatric:        Mood and Affect: Mood and affect and mood normal.  UC Treatments / Results  Labs (all labs ordered are listed, but only abnormal results are displayed) Labs Reviewed - No data to display  EKG   Radiology No results found.  Procedures Procedures (including critical care time)  Medications Ordered in UC Medications - No data to display  Initial Impression / Assessment and Plan / UC Course  I have reviewed the triage vital signs and the nursing notes.  Pertinent labs & imaging results that were available during my care of the patient were reviewed by me and considered in my medical decision making (see chart for  details).     MDM: I will treat pt with augmentin.  Pt is advised to follow up with ent Final Clinical Impressions(s) / UC Diagnoses   Final diagnoses:  Ear pain, right     Discharge Instructions     Schedule to see the ENT for evaluation    ED Prescriptions    Medication Sig Dispense Auth. Provider   amoxicillin (AMOXIL) 875 MG tablet Take 1 tablet (875 mg total) by mouth 2 (two) times daily. 20 tablet Fransico Meadow, [place]    An After Visit Summary was printed and given to the patient.  PDMP not reviewed this encounter.   Fransico Meadow, [place] 05/05/20 1630

## 2020-05-08 ENCOUNTER — Other Ambulatory Visit: Payer: Self-pay

## 2020-05-08 ENCOUNTER — Encounter: Payer: Self-pay | Admitting: Adult Health

## 2020-05-08 ENCOUNTER — Ambulatory Visit (INDEPENDENT_AMBULATORY_CARE_PROVIDER_SITE_OTHER): Payer: Medicare Other | Admitting: Adult Health

## 2020-05-08 VITALS — BP 110/78 | HR 75 | Temp 97.5°F | Wt 206.0 lb

## 2020-05-08 DIAGNOSIS — J310 Chronic rhinitis: Secondary | ICD-10-CM | POA: Diagnosis not present

## 2020-05-08 DIAGNOSIS — H9201 Otalgia, right ear: Secondary | ICD-10-CM | POA: Diagnosis not present

## 2020-05-08 NOTE — Patient Instructions (Signed)
Recommend daily allergy medication - fexofenadine- loratadine or zyrtec - do consistently  Complete antibiotic treatment   Can continue tylenol/ibuprofen as needed for pain  Follow up if getting worse     If you still feel congested can add coricedin HBP   Continue aquafor or vaseline   Little Remedies saline spray (aerosol/mist)- can try this, it is in the kids section -Dextormethorphan + chlorpheniramine (Coridcidin HBP)- okay if you have high blood pressure -Saline nasal spray    Avoid hydrogen peroxide excessively -    Earache, Adult An earache, or ear pain, can be caused by many things, including:  An infection.  Ear wax buildup.  Ear pressure.  Something in the ear that should not be there (foreign body).  A sore throat.  Tooth problems.  Jaw problems. Treatment of the earache will depend on the cause. If the cause is not clear or cannot be determined, you may need to watch your symptoms until your earache goes away or until a cause is found. Follow these instructions at home: Medicines  Take or apply over-the-counter and prescription medicines only as told by your health care provider.  If you were prescribed an antibiotic medicine, use it as told by your health care provider. Do not stop using the antibiotic even if you start to feel better.  Do not put anything in your ear other than medicine that is prescribed by your health care provider. Managing pain If directed, apply heat to the affected area as often as told by your health care provider. Use the heat source that your health care provider recommends, such as a moist heat pack or a heating pad.  Place a towel between your skin and the heat source.  Leave the heat on for 20-30 minutes.  Remove the heat if your skin turns bright red. This is especially important if you are unable to feel pain, heat, or cold. You may have a greater risk of getting burned. If directed, put ice on the affected area  as often as told by your health care provider. To do this:      Put ice in a plastic bag.  Place a towel between your skin and the bag.  Leave the ice on for 20 minutes, 2-3 times a day. General instructions  Pay attention to any changes in your symptoms.  Try resting in an upright position instead of lying down. This may help to reduce pressure in your ear and relieve pain.  Chew gum if it helps to relieve your ear pain.  Treat any allergies as told by your health care provider.  Drink enough fluid to keep your urine pale yellow.  It is up to you to get the results of any tests that were done. Ask your health care provider, or the department that is doing the tests, when your results will be ready.  Keep all follow-up visits as told by your health care provider. This is important. Contact a health care provider if:  Your pain does not improve within 2 days.  Your earache gets worse.  You have new symptoms.  You have a fever. Get help right away if you:  Have a severe headache.  Have a stiff neck.  Have trouble swallowing.  Have redness or swelling behind your ear.  Have fluid or blood coming from your ear.  Have hearing loss.  Feel dizzy. Summary  An earache, or ear pain, can be caused by many things.  Treatment of the earache will  depend on the cause. Follow recommendations from your health care provider to treat your ear pain.  If the cause is not clear or cannot be determined, you may need to watch your symptoms until your earache goes away or until a cause is found.  Keep all follow-up visits as told by your health care provider. This is important. This information is not intended to replace advice given to you by your health care provider. Make sure you discuss any questions you have with your health care provider. Document Revised: 12/10/2018 Document Reviewed: 12/10/2018 Elsevier Patient Education  [place].

## 2020-05-08 NOTE — Progress Notes (Signed)
Assessment and Plan:  Margaret Gilmore was seen today for ear pain.  Diagnoses and all orders for this visit:  Right ear pain Pain is essentially resolved with amoxicillin; benign exam today; continue to complete full abx course. Monitor and follow up for any recurrent or persistent sx.   Chronic rhinitis Fexofenadine OTC, take daily Continue aquafor/vaseline; avoid H2O2 Saline sprays and humidifier Avoid allergy triggers Hygiene reviewed Can add coricidin HBP PRN congestion  Further disposition pending results of labs. Discussed med's effects and SE's.   Over 15 minutes of exam, counseling, chart review, and critical decision making was performed.   Future Appointments  Date Time Provider [place]  08/15/2020  9:00 AM [name], MD [facility] None    ------------------------------------------------------------------------------------------------------------------   HPI BP 110/78   Pulse 75   Temp (!) 97.5 F (36.4 C)   Wt 206 lb (93.4 kg)   SpO2 95%   BMI 34.28 kg/m   64 y.o.female with hx of trigeminal neuralgia (R), chronic allergic rhinitis, presents for evaluation of R sided ear pain. Hx of   She reports this began last week, sharp R sided ear pain, some mastoid and neck pain, was evaluated by ER, reports was advised not obvious infection but was initiated on amoxicillin with significant improvement in sx, currently 1-2/10, taking ibuprofen PRN which does help.   She is also concerned about persistent rhinitis, reports this is chronic and ongoing,  She has had a significant issue with her nose with some necrosis and scarring of the right nares. She apparently has had persistent injections of the nose with steroids for infected lupus and it has dissolved her cartilage away. Declined plastics for correction.   Reports persistent drainage, irritation, scaling/flaking of skin with intermittent bloody discharge. Was doing 1/2 H2O2 1/2 water irrigations but stopped,  recently just doing vaseline. She admits hasn't been taking antihistamine, known allergic rhinitis hx.     Past Medical History:  Diagnosis Date  . Anxiety    situational  . Arthritis   . B12 deficiency    hx of , no longer taking medication  . Cataracts, bilateral   . Constipation   . Depression    situational  . Esophageal stricture   . Family history of adverse reaction to anesthesia    mom had problems waking up  . Fatty liver   . GERD (gastroesophageal reflux disease)   . Headache    migraines   . Macular hole of left eye   . Trigeminal neuralgia    right side of face is numb  . Unspecified vitamin D deficiency      Allergies  Allergen Reactions  . Benadryl [Diphenhydramine]     Severe headaches   . Lyrica [Pregabalin] Swelling  . Nitrous Oxide     Pt has gas bubble in right eye- will cause blindness per pt   . Sulfa Antibiotics Other (See Comments)    Headache    Current Outpatient Medications on File Prior to Visit  Medication Sig  . acetaminophen (TYLENOL) 500 MG tablet Take 1,000 mg by mouth 2 (two) times daily as needed for moderate pain or headache.   Marland Kitchen amoxicillin (AMOXIL) 875 MG tablet Take 1 tablet (875 mg total) by mouth 2 (two) times daily.  . Ascorbic Acid (VITAMIN C PO) Take by mouth daily.  Marland Kitchen esomeprazole (NEXIUM) 40 MG capsule Take 1 capsule (40 mg total) by mouth daily.  Marland Kitchen OVER THE COUNTER MEDICATION Hair, skin and nails  . VITAMIN  D PO Take by mouth daily.  . diclofenac (VOLTAREN) 75 MG EC tablet Take 1 tablet (75 mg total) by mouth 2 (two) times daily.   No current facility-administered medications on file prior to visit.    ROS: all negative except above.   Physical Exam:  BP 110/78   Pulse 75   Temp (!) 97.5 F (36.4 C)   Wt 206 lb (93.4 kg)   SpO2 95%   BMI 34.28 kg/m     General Appearance: Well nourished, in no apparent distress. Eyes: PERRLA, conjunctiva no swelling or erythema Sinuses: No Frontal/maxillary  tenderness ENT/Mouth: Ext aud canals clear, TMs without erythema, bulging. No mastoid tenderness. No erythema, swelling, or exudate on post pharynx.  Tonsils not swollen or erythematous. Hearing normal. Distal/lateral R nare mosly absent. MM moist, with clear/bloody crusting discharge. No TMJ tenderness.  Neck: Supple, thyroid normal, non-tender Respiratory: Respiratory effort normal, BS equal bilaterally without rales, rhonchi, wheezing or stridor.  Cardio: RRR with no MRGs. Brisk peripheral pulses without edema.  Abdomen: Soft, + BS.  Non tender Lymphatics: Non tender without lymphadenopathy.  Musculoskeletal: Neck with full ROM, non-tender, normal gait.  Skin: Warm, dry without rashes, lesions, ecchymosis.  Neuro: Normal muscle tone Psych: Awake and oriented X 3, normal affect, Insight and Judgment appropriate.     Izora Ribas, NP 9:54 AM [facility]

## 2020-05-23 DIAGNOSIS — M950 Acquired deformity of nose: Secondary | ICD-10-CM | POA: Diagnosis not present

## 2020-05-23 DIAGNOSIS — J3489 Other specified disorders of nose and nasal sinuses: Secondary | ICD-10-CM | POA: Diagnosis not present

## 2020-06-10 ENCOUNTER — Encounter: Payer: Medicare Other | Admitting: Adult Health

## 2020-06-25 DIAGNOSIS — M950 Acquired deformity of nose: Secondary | ICD-10-CM | POA: Diagnosis not present

## 2020-06-25 DIAGNOSIS — J3489 Other specified disorders of nose and nasal sinuses: Secondary | ICD-10-CM | POA: Diagnosis not present

## 2020-07-09 NOTE — Progress Notes (Signed)
Welcome to Medicare  Assessment and Plan:   Welcome to Medicare  Due annually   - mammogram - last remote - ordered, patient given number to schedule -  Bone density - defer per patient  Vitamin D deficiency -     VITAMIN D 25 Hydroxy (Vit-D Deficiency, Fractures)  Prediabetes Discussed disease and risks Discussed diet/exercise, weight management  -     Hemoglobin A1c  Major depressive disorder, moderate, current ([place]) Discussed and started wellbutrin 150 mg XR once daily Lifestyle discussed: diet/exerise, sleep hygiene, stress management, hydration Follow up 1 month, call the office if any new AE's from medications and we will switch them  Obesity (BMI 30.0-34.9) Long discussion about weight loss, diet, and exercise Recommended diet heavy in fruits and veggies and low in animal meats, cheeses, and dairy products, appropriate calorie intake Discussed appropriate weight for height Follow up at next visit  Macular hole, right eye Followed by ophthalmology - declining further surgeries at this time  Macular hole, left eye Followed by ophthalmology  Medication management -     CBC with Differential/Platelet -     COMPLETE METABOLIC PANEL WITH GFR -     Magnesium  RBBB Monitor, avoid rate controlling agents  Acquired deformity of nose Discussing with [facility] plastics; avoid nasal steroids/meds  Need for pneumonia vaccination - 23 valent pneumococcal vaccine administered without complication today      Orders Placed This Encounter  Procedures  . MM Digital Screening  . Pneumococcal polysaccharide vaccine 23-valent greater than or equal to 2yo subcutaneous/IM  . Hepatitis C antibody  . CBC with Differential/Platelet  . COMPLETE METABOLIC PANEL WITH GFR  . Magnesium  . TSH  . VITAMIN D 25 Hydroxy (Vit-D Deficiency, Fractures)  . [place], RETROPERITNL ABD,  LTD  . EKG 12-Lead     Discussed med's effects and SE's. Screening labs and tests as requested with regular  follow-up as recommended. Over 40 minutes of exam, counseling, chart review, and complex, high level critical decision making was performed this visit.   Future Appointments  Date Time Provider [place]  08/15/2020  9:00 AM [name], MD [facility] None  01/29/2021 10:00 AM [name], NP [facility] None  07/10/2021 10:30 AM [name], NP [facility] None     Plan:   During the course of the visit the patient was educated and counseled about appropriate screening and preventive services including:    Pneumococcal vaccine   Prevnar 13  Influenza vaccine  Td vaccine  Screening electrocardiogram  Bone densitometry screening  Colorectal cancer screening  Diabetes screening  Glaucoma screening  Nutrition counseling   Advanced directives: requested    HPI  65 y.o. female  presents for Welcome to Medicare visit. She  has Depression, major, recurrent ([place]); Vitamin D deficiency; Anxiety; Prediabetes; Macular hole, left eye; Macular hole, right eye; Obesity (BMI 30.0-34.9); Right bundle branch block (RBBB); Chronic rhinitis; and Nasal deformity, acquired on their problem list.   Has history of GERD and has had EGD/dilitation in 2015 and again in 2019. 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   Hx of depression/anxiety, has preferred to avoid meds but today reports worse this past winter, poor motivation to get out of bed, poor appetite, interested in starting medication. Denies SI/HI.   She has acquired nasal deformity following steroid injections remotely for possible discoid lupus, now has chronic deformity, nasal discharge and nasal crusting, saw ENT in [place] [name], using aquafor barrier, has been referred to cosmetic surgeon, considering sometime this year.   She has recurrent macular holes, surgery x 3, no longer driving, daughter or mom drives her if needed.   She did  have 2 mechanical falls, no history of falls, unsteady gait, leaned on broom at work and fell.   BMI is Body mass index is 32.62 kg/m., she has been working on diet and exercise, has been doing 10000 steps daily on fitbit, and doing isogenix. She has lost 8 lb in the last 2 weeks, she would like to get down to 150 lb. Wt Readings from Last 3 Encounters:  07/10/20 196 lb (88.9 kg)  05/08/20 206 lb (93.4 kg)  05/01/20 202 lb (91.6 kg)   Today their BP is BP: 110/74 She does workout. She denies chest pain, shortness of breath, dizziness.   She is not on cholesterol medication and denies myalgias. Her cholesterol is at goal. The cholesterol last visit was:   Lab Results  Component Value Date   CHOL 180 12/16/2017   HDL 54 12/16/2017   LDLCALC 104 (H) 12/16/2017   TRIG 120 12/16/2017   CHOLHDL 3.3 12/16/2017   She has been working on diet and exercise for prediabetes, she is not on bASA, she is not on ACE/ARB and denies foot ulcerations, increased appetite, nausea, paresthesia of the feet, polydipsia, polyuria, vomiting and weight loss. Last A1C in the office was:  Lab Results  Component Value Date   HGBA1C 5.6 12/16/2017   Last GFR: Lab Results  Component Value Date   GFRNONAA 68 12/16/2017   Patient is on Vitamin D supplement, unsure of dose   Lab Results  Component Value Date   VD25OH 37 12/16/2017      Current Medications:  Current Outpatient Medications on File Prior to Visit  Medication Sig Dispense Refill  . acetaminophen (TYLENOL) 500 MG tablet Take 1,000 mg by mouth 2 (two) times daily as needed for moderate pain or headache.     . Ascorbic Acid (VITAMIN C PO) Take by mouth daily.    . COLLAGEN PO Take by mouth.    . esomeprazole (NEXIUM) 40 MG capsule Take 1 capsule (40 mg total) by mouth daily. 30 capsule 1  . OVER THE COUNTER MEDICATION Hair, skin and nails    . VITAMIN D PO Take 5,000 Units by mouth daily.     No current facility-administered medications on file  prior to visit.   Allergies:  Allergies  Allergen Reactions  . Benadryl [Diphenhydramine]     Severe headaches   . Lyrica [Pregabalin] Swelling  . Nitrous Oxide     Pt has gas bubble in right eye- will cause blindness per pt   . Sulfa Antibiotics Other (See Comments)    Headache   Medical History:  She has Depression, major, recurrent ([place]); Vitamin D deficiency; Anxiety; Prediabetes; Macular hole, left eye; Macular hole, right eye; Obesity (BMI 30.0-34.9); Right bundle branch block (RBBB); Chronic rhinitis; and Nasal deformity, acquired on their problem list. Health Maintenance:   Immunization History  Administered Date(s) Administered  . Influenza-Unspecified 02/15/2018, 02/16/2020  . PPD Test 05/31/2013  . Pneumococcal Polysaccharide-23 07/10/2020  . Tdap 05/31/2013    Tetanus: 2015 Pneumovax: 1 year after prevnar Prevnar 13: DUE Flu vaccine: 02/16/2020 Shingrix: check with insurance  Covid 19: declines  Pap: Remote, never abnormal, s/p TAH, declines further unless problems MGM: ? In the last 10 years, will schedule  - order  placed  DEXA: has had, ?2009, normal, postpone for now per pt pref  Colonoscopy: 2015 EGD: 2019  Last Dental Exam: Dr. Luan Pulling in [place], last visit 2018 Last Eye Exam: Dr. Tempie Hoist, [facility], last visit 2021, goes q48m  Patient Care Team: Unk Pinto, MD as PCP - General (Internal Medicine)  Surgical History:  She has a past surgical history that includes Abdominal hysterectomy; Cesarean section; trigeminal ; Appendectomy; knot removed from right side of abdomen; Colonoscopy; 25 gauge pars plana vitrectomy with 20 gauge mvr port for macular hole (Left, 03/05/2015); Membrane peel (Left, 03/05/2015); Serum patch (Left, 03/05/2015); Laser photo ablation (Left, 03/05/2015); Gas/fluid exchange (Left, 03/05/2015); 25 gauge pars plana vitrectomy with 20 gauge mvr port for macular hole (Right, 06/22/2017); 25 gauge pars  plana vitrectomy with 20 gauge mvr port for macular hole (Right, 06/22/2017); Gas/fluid exchange (Right, 06/22/2017); Laser photo ablation (Right, 06/22/2017); Serum patch (Right, 06/22/2017); Membrane peel (Right, 06/22/2017); Scleral buckle with possible 25 gauge pars plana vitrectomy (Right, 07/27/2017); Laser photo ablation (Right, 07/27/2017); Gas/fluid exchange (Right, 07/27/2017); Membrane peel (Right, 07/27/2017); 25 gauge pars plana vitrectomy with 20 gauge mvr port (Left, 11/09/2017); Esophagogastroduodenoscopy (egd) with esophageal dilation; Eye surgery; 25 gauge pars plana vitrectomy with 20 gauge mvr port (Left, 11/09/2017); Membrane peel (Left, 11/09/2017); Gas/fluid exchange (Left, 11/09/2017); Serum patch (Left, 11/09/2017); and Laser photo ablation (Left, 11/09/2017). Family History:  Herfamily history includes Alzheimer's disease in her maternal aunt and maternal grandmother; Bone cancer in her maternal grandfather; Breast cancer in her maternal aunt; COPD in her mother; Cancer in her paternal uncle; Colon cancer in her paternal uncle; Diabetes in her mother; Heart disease in her father; Hodgkin's lymphoma in her paternal aunt; Hyperlipidemia in her father and mother; Hypertension in her brother and father; Pancreatic cancer in her father; Prostate cancer in her maternal grandfather. Social History:  She reports that she has never smoked. She has never used smokeless tobacco. She reports that she does not drink alcohol and does not use drugs.  MEDICARE WELLNESS OBJECTIVES: Physical activity: Current Exercise Habits: The patient does not participate in regular exercise at present, Exercise limited by: psychological condition(s) Cardiac risk factors: Cardiac Risk Factors include: advanced age (>96men, >79 women);dyslipidemia;hypertension;obesity (BMI >30kg/m2);sedentary lifestyle Depression/mood screen:   Depression screen [facility] 2/9 07/10/2020  Decreased Interest 2  Down, Depressed, Hopeless 2  PHQ - 2 Score  4  Altered sleeping 1  Tired, decreased energy 3  Change in appetite 3  Feeling bad or failure about yourself  2  Trouble concentrating 1  Moving slowly or fidgety/restless 0  Suicidal thoughts 0  PHQ-9 Score 14  Difficult doing work/chores Somewhat difficult    ADLs:  In your present state of health, do you have any difficulty performing the following activities: 07/10/2020  Hearing? N  Vision? N  Difficulty concentrating or making decisions? N  Walking or climbing stairs? N  Dressing or bathing? N  Doing errands, shopping? Y  Comment no longer driving, daughter and mom drives as needed  Some recent data might be hidden     Cognitive Testing  Alert? Yes  Normal Appearance?Yes  Oriented to person? Yes  Place? Yes   Time? Yes  Recall of three objects?  Yes  Can perform simple calculations? Yes  Displays appropriate judgment?Yes  Can read the correct time from a watch face?Yes  EOL planning: Does Patient Have a Medical Advance Directive?: No Would patient like information on creating a medical advance directive?: No -  Patient declined      Review of Systems: Review of Systems  Constitutional: Negative for malaise/fatigue and weight loss.  HENT: Negative for hearing loss and tinnitus.        Chronic rhinitis  Eyes: Negative for blurred vision and double vision.  Respiratory: Negative for cough, sputum production, shortness of breath and wheezing.   Cardiovascular: Negative for chest pain, palpitations, orthopnea, claudication, leg swelling and PND.  Gastrointestinal: Negative for abdominal pain, blood in stool, constipation, diarrhea, heartburn, melena, nausea and vomiting.  Genitourinary: Negative.   Musculoskeletal: Negative for falls, joint pain and myalgias.  Skin: Negative for rash.  Neurological: Negative for dizziness, tingling, sensory change, weakness and headaches.  Endo/Heme/Allergies: Positive for environmental allergies. Negative for polydipsia.   Psychiatric/Behavioral: Positive for depression. Negative for memory loss, substance abuse and suicidal ideas. The patient is not nervous/anxious and does not have insomnia.   All other systems reviewed and are negative.   Physical Exam: Estimated body mass index is 32.62 kg/m as calculated from the following:   Height as of 05/01/20: 5\' 5"  (1.651 m).   Weight as of this encounter: 196 lb (88.9 kg). BP 110/74   Pulse 80   Temp (!) 97.3 F (36.3 C)   Wt 196 lb (88.9 kg)   SpO2 96%   BMI 32.62 kg/m  General Appearance: Well nourished, in no apparent distress.  Eyes: PERRLA, EOMs, conjunctiva no swelling or erythema Sinuses: No Frontal/maxillary tenderness  ENT/Mouth: Ext aud canals clear, normal light reflex with TMs without erythema, bulging. Good dentition. No erythema, swelling, or exudate on post pharynx. Tonsils not swollen or erythematous. Hearing normal. R nare chronically deformed/absent; no open wounds or ulcerations; clear crusty discharge.  Neck: Supple, thyroid normal. No bruits  Respiratory: Respiratory effort normal, BS equal bilaterally without rales, rhonchi, wheezing or stridor.  Cardio: RRR without murmurs, rubs or gallops. Brisk peripheral pulses without edema.  Chest: symmetric, with normal excursions and percussion.  Abdomen: Soft, nontender, no guarding, rebound, hernias, masses, or organomegaly.  Lymphatics: Non tender without lymphadenopathy.  Musculoskeletal: Full ROM all peripheral extremities,5/5 strength, and normal gait.  Skin: Warm, dry without rashes, lesions, ecchymosis. 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 Normal muscle tone, no cerebellar symptoms. Sensation intact.  Psych: Awake and oriented X 3, normal affect, Insight and Judgment appropriate.   EKG: NSR, IRBBB AAA [place]: normal, <3 cm  Medicare Attestation I have personally reviewed: The patient's medical and social history Their use of  alcohol, tobacco or illicit drugs Their current medications and supplements The patient's functional ability including ADLs,fall risks, home safety risks, cognitive, and hearing and visual impairment Diet and physical activities Evidence for depression or mood disorders  The patient's weight, height, BMI, and visual acuity have been recorded in the chart.  I have made referrals, counseling, and provided education to the patient based on review of the above and I have provided the patient with a written personalized care plan for preventive services.     Izora Ribas 2:46 PM [facility]

## 2020-07-10 ENCOUNTER — Encounter: Payer: Self-pay | Admitting: Adult Health

## 2020-07-10 ENCOUNTER — Ambulatory Visit (INDEPENDENT_AMBULATORY_CARE_PROVIDER_SITE_OTHER): Payer: PPO | Admitting: Adult Health

## 2020-07-10 ENCOUNTER — Other Ambulatory Visit: Payer: Self-pay

## 2020-07-10 VITALS — BP 110/74 | HR 80 | Temp 97.3°F | Wt 196.0 lb

## 2020-07-10 DIAGNOSIS — E669 Obesity, unspecified: Secondary | ICD-10-CM | POA: Diagnosis not present

## 2020-07-10 DIAGNOSIS — E559 Vitamin D deficiency, unspecified: Secondary | ICD-10-CM | POA: Diagnosis not present

## 2020-07-10 DIAGNOSIS — Z1159 Encounter for screening for other viral diseases: Secondary | ICD-10-CM | POA: Diagnosis not present

## 2020-07-10 DIAGNOSIS — Z23 Encounter for immunization: Secondary | ICD-10-CM | POA: Diagnosis not present

## 2020-07-10 DIAGNOSIS — I4510 Unspecified right bundle-branch block: Secondary | ICD-10-CM

## 2020-07-10 DIAGNOSIS — R7303 Prediabetes: Secondary | ICD-10-CM | POA: Diagnosis not present

## 2020-07-10 DIAGNOSIS — F331 Major depressive disorder, recurrent, moderate: Secondary | ICD-10-CM | POA: Diagnosis not present

## 2020-07-10 DIAGNOSIS — Z1231 Encounter for screening mammogram for malignant neoplasm of breast: Secondary | ICD-10-CM | POA: Diagnosis not present

## 2020-07-10 DIAGNOSIS — M950 Acquired deformity of nose: Secondary | ICD-10-CM

## 2020-07-10 DIAGNOSIS — H33021 Retinal detachment with multiple breaks, right eye: Secondary | ICD-10-CM

## 2020-07-10 DIAGNOSIS — Z0001 Encounter for general adult medical examination with abnormal findings: Secondary | ICD-10-CM

## 2020-07-10 DIAGNOSIS — I10 Essential (primary) hypertension: Secondary | ICD-10-CM | POA: Diagnosis not present

## 2020-07-10 DIAGNOSIS — H35341 Macular cyst, hole, or pseudohole, right eye: Secondary | ICD-10-CM | POA: Diagnosis not present

## 2020-07-10 DIAGNOSIS — J310 Chronic rhinitis: Secondary | ICD-10-CM

## 2020-07-10 DIAGNOSIS — R6889 Other general symptoms and signs: Secondary | ICD-10-CM

## 2020-07-10 DIAGNOSIS — Z79899 Other long term (current) drug therapy: Secondary | ICD-10-CM

## 2020-07-10 DIAGNOSIS — Z0000 Encounter for general adult medical examination without abnormal findings: Secondary | ICD-10-CM

## 2020-07-10 DIAGNOSIS — F419 Anxiety disorder, unspecified: Secondary | ICD-10-CM | POA: Diagnosis not present

## 2020-07-10 DIAGNOSIS — Z136 Encounter for screening for cardiovascular disorders: Secondary | ICD-10-CM | POA: Diagnosis not present

## 2020-07-10 DIAGNOSIS — H35342 Macular cyst, hole, or pseudohole, left eye: Secondary | ICD-10-CM | POA: Diagnosis not present

## 2020-07-10 DIAGNOSIS — F325 Major depressive disorder, single episode, in full remission: Secondary | ICD-10-CM

## 2020-07-10 MED ORDER — BUPROPION HCL ER (XL) 150 MG PO TB24: 150.0000 mg | ORAL_TABLET | ORAL | 0 refills | Status: DC

## 2020-07-10 NOTE — Patient Instructions (Addendum)
Margaret Gilmore , Thank you for taking time to come for your Medicare Wellness Visit. I appreciate your ongoing commitment to your health goals. Please review the following plan we discussed and let me know if I can assist you in the future.   This is a list of the screening recommended for you and due dates:  Health Maintenance  Topic Date Due  .  Hepatitis C: One time screening is recommended by Center for Disease Control  (CDC) for  adults born from [birth year] through 1966.   Never done  . Pap Smear  Never done  . Mammogram  Never done  . Pneumonia vaccines (1 of 2 - PCV13) Never done  . COVID-19 Vaccine (1) 07/26/2020*  . Tetanus Vaccine  06/01/2023  . Colon Cancer Screening  08/26/2023  . Flu Shot  Completed  . DEXA scan (bone density measurement)  Completed  . HIV Screening  Discontinued  *Topic was postponed. The date shown is not the original due date.      Try melatonin 5-15 mg at bedtime    HOW TO SCHEDULE A MAMMOGRAM  [facility]  7 a.m.-6:30 p.m., Monday 7 a.m.-5 p.m., Tuesday-Friday Schedule an appointment by calling [phone].  [facility] Schedule an appointment by calling [phone].     Bupropion extended-release tablets (Depression/Mood Disorders) What is this medicine? BUPROPION (byoo PROE pee on) is used to treat depression. This medicine may be used for other purposes; ask your health care provider or pharmacist if you have questions. COMMON BRAND NAME(S): Aplenzin, Budeprion XL, Forfivo XL, Wellbutrin XL What should I tell my health care provider before I take this medicine? They need to know if you have any of these conditions:  an eating disorder, such as anorexia or bulimia  bipolar disorder or psychosis  diabetes or high blood sugar, treated with medication  glaucoma  head injury or brain tumor  heart disease, previous heart attack, or irregular heart beat  high blood pressure  kidney or liver  disease  seizures (convulsions)  suicidal thoughts or a previous suicide attempt  Tourette's syndrome  weight loss  an unusual or allergic reaction to bupropion, other medicines, foods, dyes, or preservatives  breast-feeding  pregnant or trying to become pregnant How should I use this medicine? Take this medicine by mouth with a glass of water. Follow the directions on the prescription label. You can take it with or without food. If it upsets your stomach, take it with food. Do not crush, chew, or cut these tablets. This medicine is taken once daily at the same time each day. Do not take your medicine more often than directed. Do not stop taking this medicine suddenly except upon the advice of your doctor. Stopping this medicine too quickly may cause serious side effects or your condition may worsen. A special MedGuide will be given to you by the pharmacist with each prescription and refill. Be sure to read this information carefully each time. Talk to your pediatrician regarding the use of this medicine in children. Special care may be needed. Overdosage: If you think you have taken too much of this medicine contact a poison control center or emergency room at once. NOTE: This medicine is only for you. Do not share this medicine with others. What if I miss a dose? If you miss a dose, skip the missed dose and take your next tablet at the regular time. Do not take double or extra doses. What may interact  with this medicine? Do not take this medicine with any of the following medications:  linezolid  MAOIs like Azilect, Carbex, Eldepryl, Marplan, Nardil, and Parnate  methylene blue (injected into a vein)  other medicines that contain bupropion like Zyban This medicine may also interact with the following medications:  alcohol  certain medicines for anxiety or sleep  certain medicines for blood pressure like metoprolol, propranolol  certain medicines for depression or psychotic  disturbances  certain medicines for HIV or AIDS like efavirenz, lopinavir, nelfinavir, ritonavir  certain medicines for irregular heart beat like propafenone, flecainide  certain medicines for Parkinson's disease like amantadine, levodopa  certain medicines for seizures like carbamazepine, phenytoin, phenobarbital  cimetidine  clopidogrel  cyclophosphamide  digoxin  furazolidone  isoniazid  nicotine  orphenadrine  procarbazine  steroid medicines like prednisone or cortisone  stimulant medicines for attention disorders, weight loss, or to stay awake  tamoxifen  theophylline  thiotepa  ticlopidine  tramadol  warfarin This list may not describe all possible interactions. Give your health care provider a list of all the medicines, herbs, non-prescription drugs, or dietary supplements you use. Also tell them if you smoke, drink alcohol, or use illegal drugs. Some items may interact with your medicine. What should I watch for while using this medicine? Tell your doctor if your symptoms do not get better or if they get worse. Visit your doctor or healthcare provider for regular checks on your progress. Because it may take several weeks to see the full effects of this medicine, it is important to continue your treatment as prescribed by your doctor. This medicine may cause serious skin reactions. They can happen weeks to months after starting the medicine. Contact your healthcare provider right away if you notice fevers or flu-like symptoms with a rash. The rash may be red or purple and then turn into blisters or peeling of the skin. Or, you might notice a red rash with swelling of the face, lips or lymph nodes in your neck or under your arms. Patients and their families should watch out for new or worsening thoughts of suicide or depression. Also watch out for sudden changes in feelings such as feeling anxious, agitated, panicky, irritable, hostile, aggressive, impulsive,  severely restless, overly excited and hyperactive, or not being able to sleep. If this happens, especially at the beginning of treatment or after a change in dose, call your healthcare provider. Avoid alcoholic drinks while taking this medicine. Drinking large amounts of alcoholic beverages, using sleeping or anxiety medicines, or quickly stopping the use of these agents while taking this medicine may increase your risk for a seizure. Do not drive or use heavy machinery until you know how this medicine affects you. This medicine can impair your ability to perform these tasks. Do not take this medicine close to bedtime. It may prevent you from sleeping. Your mouth may get dry. Chewing sugarless gum or sucking hard candy, and drinking plenty of water may help. Contact your doctor if the problem does not go away or is severe. The tablet shell for some brands of this medicine does not dissolve. This is normal. The tablet shell may appear whole in the stool. This is not a cause for concern. What side effects may I notice from receiving this medicine? Side effects that you should report to your doctor or health care professional as soon as possible:  allergic reactions like skin rash, itching or hives, swelling of the face, lips, or tongue  breathing problems  changes  in vision  confusion  elevated mood, decreased need for sleep, racing thoughts, impulsive behavior  fast or irregular heartbeat  hallucinations, loss of contact with reality  increased blood pressure  rash, fever, and swollen lymph nodes  redness, blistering, peeling or loosening of the skin, including inside the mouth  seizures  suicidal thoughts or other mood changes  unusually weak or tired  vomiting Side effects that usually do not require medical attention (report to your doctor or health care professional if they continue or are bothersome):  constipation  headache  loss of  appetite  nausea  tremors  weight loss This list may not describe all possible side effects. Call your doctor for medical advice about side effects. You may report side effects to FDA at 1-800-FDA-1088. Where should I keep my medicine? Keep out of the reach of children. Store at room temperature between 15 and 30 degrees C (59 and 86 degrees F). Throw away any unused medicine after the expiration date. NOTE: This sheet is a summary. It may not cover all possible information. If you have questions about this medicine, talk to your doctor, pharmacist, or health care provider.  2021 Elsevier/Gold Standard (2018-07-28 13:45:31)

## 2020-07-11 LAB — CBC WITH DIFFERENTIAL/PLATELET
Absolute Monocytes: 522 cells/uL (ref 200–950)
Basophils Absolute: 44 cells/uL (ref 0–200)
Basophils Relative: 0.5 %
Eosinophils Absolute: 78 cells/uL (ref 15–500)
Eosinophils Relative: 0.9 %
HCT: 39.7 % (ref 35.0–45.0)
Hemoglobin: 13.3 g/dL (ref 11.7–15.5)
Lymphs Abs: 2219 cells/uL (ref 850–3900)
MCH: 29.1 pg (ref 27.0–33.0)
MCHC: 33.5 g/dL (ref 32.0–36.0)
MCV: 86.9 fL (ref 80.0–100.0)
MPV: 9.9 fL (ref 7.5–12.5)
Monocytes Relative: 6 %
Neutro Abs: 5838 cells/uL (ref 1500–7800)
Neutrophils Relative %: 67.1 %
Platelets: 379 10*3/uL (ref 140–400)
RBC: 4.57 10*6/uL (ref 3.80–5.10)
RDW: 13.1 % (ref 11.0–15.0)
Total Lymphocyte: 25.5 %
WBC: 8.7 10*3/uL (ref 3.8–10.8)

## 2020-07-11 LAB — COMPLETE METABOLIC PANEL WITH GFR
AG Ratio: 1.4 (calc) (ref 1.0–2.5)
ALT: 11 U/L (ref 6–29)
AST: 12 U/L (ref 10–35)
Albumin: 4.1 g/dL (ref 3.6–5.1)
Alkaline phosphatase (APISO): 68 U/L (ref 37–153)
BUN: 23 mg/dL (ref 7–25)
CO2: 25 mmol/L (ref 20–32)
Calcium: 9.8 mg/dL (ref 8.6–10.4)
Chloride: 104 mmol/L (ref 98–110)
Creat: 0.78 mg/dL (ref 0.50–0.99)
GFR, Est African American: 92 mL/min/{1.73_m2} (ref >=60)
GFR, Est Non African American: 80 mL/min/{1.73_m2} (ref >=60)
Globulin: 3 g/dL (calc) (ref 1.9–3.7)
Glucose, Bld: 87 mg/dL (ref 65–99)
Potassium: 4.3 mmol/L (ref 3.5–5.3)
Sodium: 139 mmol/L (ref 135–146)
Total Bilirubin: 0.4 mg/dL (ref 0.2–1.2)
Total Protein: 7.1 g/dL (ref 6.1–8.1)

## 2020-07-11 LAB — VITAMIN D 25 HYDROXY (VIT D DEFICIENCY, FRACTURES): Vit D, 25-Hydroxy: 58 ng/mL (ref 30–100)

## 2020-07-11 LAB — HEPATITIS C ANTIBODY
Hepatitis C Ab: NONREACTIVE
SIGNAL TO CUT-OFF: 0.01 (ref <1.00)

## 2020-07-11 LAB — TSH: TSH: 1.38 mIU/L (ref 0.40–4.50)

## 2020-07-11 LAB — MAGNESIUM: Magnesium: 2.2 mg/dL (ref 1.5–2.5)

## 2020-08-02 ENCOUNTER — Other Ambulatory Visit: Payer: Self-pay

## 2020-08-02 MED ORDER — BUPROPION HCL ER (XL) 150 MG PO TB24
150.0000 mg | ORAL_TABLET | ORAL | 3 refills | Status: DC
Start: 2020-08-02 — End: 2020-10-10

## 2020-08-15 ENCOUNTER — Encounter (INDEPENDENT_AMBULATORY_CARE_PROVIDER_SITE_OTHER): Payer: PPO | Admitting: Ophthalmology

## 2020-08-15 ENCOUNTER — Other Ambulatory Visit: Payer: Self-pay

## 2020-08-15 DIAGNOSIS — H35343 Macular cyst, hole, or pseudohole, bilateral: Secondary | ICD-10-CM | POA: Diagnosis not present

## 2020-08-15 DIAGNOSIS — H26491 Other secondary cataract, right eye: Secondary | ICD-10-CM

## 2020-09-10 ENCOUNTER — Other Ambulatory Visit: Payer: Self-pay

## 2020-09-10 ENCOUNTER — Encounter (INDEPENDENT_AMBULATORY_CARE_PROVIDER_SITE_OTHER): Payer: PPO | Admitting: Ophthalmology

## 2020-09-10 DIAGNOSIS — H2701 Aphakia, right eye: Secondary | ICD-10-CM

## 2020-10-10 ENCOUNTER — Other Ambulatory Visit: Payer: Self-pay | Admitting: Adult Health

## 2020-10-10 ENCOUNTER — Telehealth: Payer: Self-pay

## 2020-10-10 MED ORDER — ESCITALOPRAM OXALATE 10 MG PO TABS: ORAL_TABLET | ORAL | 1 refills | Status: DC

## 2020-10-10 NOTE — Telephone Encounter (Signed)
Patient states that she has been on Wellbutrin for 3 months and it's not working. Requesting Lexapro instead. Please advise.

## 2020-10-16 NOTE — Telephone Encounter (Signed)
Patient has started taking the Lexapro and will call is any side effects or concerns.

## 2020-11-04 ENCOUNTER — Telehealth: Payer: Self-pay

## 2020-11-04 NOTE — Telephone Encounter (Signed)
These symptoms began over the weekend, has been taking the Lexapro since May. States that she is going to try some decongestants to help and will let us know if she needs to be seen. Declined to make an appointment right now.

## 2020-11-04 NOTE — Telephone Encounter (Signed)
Patient states that she is very dizzy and when lying down, the room spins. Wanting to know if her medication could be causing this. Please advise.

## 2020-11-06 ENCOUNTER — Other Ambulatory Visit: Payer: Self-pay

## 2020-11-06 ENCOUNTER — Ambulatory Visit
Admission: EM | Admit: 2020-11-06 | Discharge: 2020-11-06 | Disposition: A | Discharge status: Home / Self Care | Payer: PPO | Attending: Family Medicine | Admitting: Family Medicine

## 2020-11-06 DIAGNOSIS — R42 Dizziness and giddiness: Secondary | ICD-10-CM

## 2020-11-06 DIAGNOSIS — R110 Nausea: Secondary | ICD-10-CM

## 2020-11-06 MED ORDER — MECLIZINE HCL 12.5 MG PO TABS: 12.5000 mg | ORAL_TABLET | Freq: Three times a day (TID) | ORAL | 0 refills | Status: DC | PRN

## 2020-11-06 MED ORDER — ONDANSETRON HCL 4 MG PO TABS: 4.0000 mg | ORAL_TABLET | Freq: Four times a day (QID) | ORAL | 0 refills | Status: DC

## 2020-11-06 NOTE — Discharge Instructions (Addendum)
I have sent in meclizine for you to use three times per day as needed  I have sent in Zofran for you to take one tablet every 8 hours as needed for nausea.  Follow up with this office or with primary care if symptoms are persisting.  Follow up in the ER for high fever, trouble swallowing, trouble breathing, other concerning symptoms.

## 2020-11-06 NOTE — ED Provider Notes (Signed)
[facility]   [id number] 11/06/20 Arrival Time: 1142  CC: DIZZINESS  SUBJECTIVE:  KEYMORA GRILLOT is a 65 y.o. female who presents with complaint of dizziness that began 4 days ago. Denies a precipitating event, trauma, or recent URI within the past month. Describes the dizziness as "the room spinning" "being on a boat" "unsteady to walk." Also reports come associated nausea with the dizziness. States that it is intermittent with episodes lasting less than a minute. Has not taken OTC medications for this. Symptoms made worse with movement and position changes. Denies to previous symptoms. Denies fever, chills, vomiting, hearing changes, tinnitus, ear pain, chest pain, syncope, SOB, weakness, slurred speech, memory or emotional changes, facial drooping/ asymmetry, incoordination, numbness or tingling, abdominal pain, changes in bowel or bladder habits.     ROS: As per HPI.  All other pertinent ROS negative.    Past Medical History:  Diagnosis Date   Anxiety    situational   Arthritis    B12 deficiency    hx of , no longer taking medication   Cataracts, bilateral    Constipation    Depression    situational   Esophageal stricture    Family history of adverse reaction to anesthesia    mom had problems waking up   Fatty liver    GERD (gastroesophageal reflux disease)    Headache    migraines    Macular hole of left eye    Retinal detachment with multiple breaks, right 07/27/2017   Trigeminal neuralgia    right side of face is numb   Unspecified vitamin D deficiency    Past Surgical History:  Procedure Laterality Date   25 GAUGE PARS PLANA VITRECTOMY WITH 20 GAUGE MVR PORT Left 11/09/2017   25 GAUGE PARS PLANA VITRECTOMY WITH 20 GAUGE MVR PORT Left 11/09/2017   Procedure: 25 GAUGE PARS PLANA VITRECTOMY WITH 20 GAUGE MVR PORT;  Surgeon: [name], MD;  Location: [place];  Service: Ophthalmology;  Laterality: Left;   25 GAUGE PARS PLANA VITRECTOMY WITH 20 GAUGE MVR  PORT FOR MACULAR HOLE Left 03/05/2015   Procedure: 25 GAUGE PARS PLANA VITRECTOMY WITH 20 GAUGE MVR PORT FOR MACULAR HOLE;  Surgeon: [name], MD;  Location: [place];  Service: Ophthalmology;  Laterality: Left;   25 GAUGE PARS PLANA VITRECTOMY WITH 20 GAUGE MVR PORT FOR MACULAR HOLE Right 06/22/2017   25 GAUGE PARS PLANA VITRECTOMY WITH 20 GAUGE MVR PORT FOR MACULAR HOLE Right 06/22/2017   Procedure: 25 GAUGE PARS PLANA VITRECTOMY WITH 20 GAUGE MVR PORT FOR MACULAR HOLE;  Surgeon: [name], MD;  Location: [place];  Service: Ophthalmology;  Laterality: Right;   ABDOMINAL HYSTERECTOMY     Total   APPENDECTOMY     CESAREAN SECTION     COLONOSCOPY     ESOPHAGOGASTRODUODENOSCOPY (EGD) WITH ESOPHAGEAL DILATION     EYE SURGERY     GAS/FLUID EXCHANGE Left 03/05/2015   Procedure: GAS/FLUID EXCHANGE;  Surgeon: [name], MD;  Location: [place];  Service: Ophthalmology;  Laterality: Left;  CF38   GAS/FLUID EXCHANGE Right 06/22/2017   Procedure: GAS/FLUID EXCHANGE;  Surgeon: [name], MD;  Location: [place];  Service: Ophthalmology;  Laterality: Right;   GAS/FLUID EXCHANGE Right 07/27/2017   Procedure: GAS/FLUID EXCHANGE;  Surgeon: [name], MD;  Location: [place];  Service: Ophthalmology;  Laterality: Right;   GAS/FLUID EXCHANGE Left 11/09/2017   Procedure: GAS/FLUID EXCHANGE;  Surgeon: [name], MD;  Location: [facility];  Service: Ophthalmology;  Laterality: Left;   knot removed from right side of abdomen     LASER PHOTO ABLATION Left 03/05/2015   Procedure: LASER PHOTO ABLATION;  Surgeon: [name], MD;  Location: [facility];  Service: Ophthalmology;  Laterality: Left;  Headscope Laser   LASER PHOTO ABLATION Right 06/22/2017   Procedure: LASER PHOTO ABLATION;  Surgeon: [name], MD;  Location: [place];  Service: Ophthalmology;  Laterality: Right;   LASER PHOTO ABLATION Right 07/27/2017   Procedure: LASER PHOTO ABLATION;  Surgeon: [name], MD;  Location: [place];   Service: Ophthalmology;  Laterality: Right;   LASER PHOTO ABLATION Left 11/09/2017   Procedure: LASER PHOTO ABLATION;  Surgeon: [name], MD;  Location: [place];  Service: Ophthalmology;  Laterality: Left;   MEMBRANE PEEL Left 03/05/2015   Procedure: MEMBRANE PEEL;  Surgeon: [name], MD;  Location: [place];  Service: Ophthalmology;  Laterality: Left;   MEMBRANE PEEL Right 06/22/2017   Procedure: MEMBRANE PEEL;  Surgeon: [name], MD;  Location: [place];  Service: Ophthalmology;  Laterality: Right;   MEMBRANE PEEL Right 07/27/2017   Procedure: MEMBRANE PEEL;  Surgeon: [name], MD;  Location: [place];  Service: Ophthalmology;  Laterality: Right;   MEMBRANE PEEL Left 11/09/2017   Procedure: MEMBRANE PEEL;  Surgeon: [name], MD;  Location: [place];  Service: Ophthalmology;  Laterality: Left;   SCLERAL BUCKLE WITH POSSIBLE 25 GAUGE PARS PLANA VITRECTOMY Right 07/27/2017   Procedure: SCLERAL BUCKLE  25 GAUGE PARS PLANA VITRECTOMY, C3F8 INJECTION REPAIR OF COMPLEX TRACTION OF RETINAL DETACHMENT;  Surgeon: [name], MD;  Location: [place];  Service: Ophthalmology;  Laterality: Right;   SERUM PATCH Left 03/05/2015   Procedure: SERUM PATCH;  Surgeon: [name], MD;  Location: [place];  Service: Ophthalmology;  Laterality: Left;   SERUM PATCH Right 06/22/2017   Procedure: SERUM PATCH;  Surgeon: [name], MD;  Location: [place];  Service: Ophthalmology;  Laterality: Right;   SERUM PATCH Left 11/09/2017   Procedure: SERUM PATCH;  Surgeon: [name], MD;  Location: [place];  Service: Ophthalmology;  Laterality: Left;   trigeminal      Allergies  Allergen Reactions   Benadryl [Diphenhydramine]     Severe headaches    Lyrica [Pregabalin] Swelling   Nitrous Oxide     Pt has gas bubble in right eye- will cause blindness per pt    Sulfa Antibiotics Other (See Comments)    Headache   No current facility-administered medications on file prior to encounter.    Current Outpatient Medications on File Prior to Encounter  Medication Sig Dispense Refill   acetaminophen (TYLENOL) 500 MG tablet Take 1,000 mg by mouth 2 (two) times daily as needed for moderate pain or headache.      Ascorbic Acid (VITAMIN C PO) Take by mouth daily.     COLLAGEN PO Take by mouth.     escitalopram (LEXAPRO) 10 MG tablet Take 1 tab daily for mood. 90 tablet 1   esomeprazole (NEXIUM) 40 MG capsule Take 1 capsule (40 mg total) by mouth daily. 30 capsule 1   OVER THE COUNTER MEDICATION Hair, skin and nails     VITAMIN D PO Take 5,000 Units by mouth daily.     Social History   Socioeconomic History   Marital status: Divorced    Spouse name: Not on file   Number of children: 2  Years of education: Not on file   Highest education level: Not on file  Occupational History   Occupation: caregiver  Tobacco Use   Smoking status: Never   Smokeless tobacco: Never  Vaping Use   Vaping Use: Never used  Substance and Sexual Activity   Alcohol use: No   Drug use: No   Sexual activity: Not Currently  Other Topics Concern   Not on file  Social History Narrative   Not on file   Social Determinants of Health   Financial Resource Strain: Not on file  Food Insecurity: Not on file  Transportation Needs: Not on file  Physical Activity: Not on file  Stress: Not on file  Social Connections: Not on file  Intimate Partner Violence: Not on file   Family History  Problem Relation Age of Onset   Pancreatic cancer Father    Hyperlipidemia Father    Heart disease Father    Hypertension Father    Diabetes Mother    COPD Mother    Hyperlipidemia Mother    Hypertension Brother    Alzheimer's disease Maternal Grandmother    Bone cancer Maternal Grandfather        metz from prostate   Prostate cancer Maternal Grandfather    Breast cancer Maternal Aunt        BREAST   Alzheimer's disease Maternal Aunt    Hodgkin's lymphoma Paternal Aunt        HODGKINS   Colon cancer  Paternal Uncle    Cancer Paternal Uncle        4 different kinds, can't recall what    OBJECTIVE:  Vitals:   11/06/20 1224  BP: 116/79  Pulse: 73  Resp: 20  Temp: 98.4 F (36.9 C)  SpO2: 96%    General appearance: alert; no distress Eyes: PERRLA; EOMI; conjunctiva normal HENT: normocephalic; atraumatic; TMs normal; nasal mucosa normal; oral mucosa normal Neck: supple with FROM Lungs: clear to auscultation bilaterally Heart: regular rate and rhythm Abdomen: soft, non-tender; bowel sounds normal Extremities: no cyanosis or edema; symmetrical with no gross deformities Skin: warm and dry Neurologic: normal gait; normal symmetric reflexes; CN 2-12 grossly intact Psychological: alert and cooperative; normal mood and affect  Labs:  No results found for this or any previous visit (from the past 24 hour(s)). Orders placed or performed in visit on 07/10/20   EKG 12-Lead    No results found.  ASSESSMENT & PLAN:  1. Vertigo   2. Nausea without vomiting     Meds ordered this encounter  Medications   ondansetron (ZOFRAN) 4 MG tablet    Sig: Take 1 tablet (4 mg total) by mouth every 6 (six) hours.    Dispense:  12 tablet    Refill:  0    Order Specific Question:   Supervising Provider    Answer:   Chase Picket [[id number]]   meclizine (ANTIVERT) 12.5 MG tablet    Sig: Take 1 tablet (12.5 mg total) by mouth 3 (three) times daily as needed for dizziness.    Dispense:  30 tablet    Refill:  0    Order Specific Question:   Supervising Provider    Answer:   Chase Picket [id number]   Prescribed meclizine 12.5mg  TID prn dizziness Prescribed zofran prn nausea Follow up with this office or with primary care if symptoms are persisting.  Follow up in the ER for high fever, trouble swallowing, trouble breathing, other concerning symptoms.  Reviewed expectations re: course of  current medical issues. Questions answered. Outlined signs and symptoms indicating need for more  acute intervention. Patient verbalized understanding. After Visit Summary given.     Faustino Congress, NP 11/06/20 1246

## 2020-11-06 NOTE — ED Triage Notes (Signed)
Pt presents with vertigo that began on Saturday and has also been nauseated .

## 2020-11-12 DIAGNOSIS — H02403 Unspecified ptosis of bilateral eyelids: Secondary | ICD-10-CM | POA: Diagnosis not present

## 2020-11-12 DIAGNOSIS — S0993XS Unspecified injury of face, sequela: Secondary | ICD-10-CM | POA: Diagnosis not present

## 2020-11-12 DIAGNOSIS — R29818 Other symptoms and signs involving the nervous system: Secondary | ICD-10-CM | POA: Diagnosis not present

## 2020-11-12 DIAGNOSIS — M952 Other acquired deformity of head: Secondary | ICD-10-CM | POA: Diagnosis not present

## 2020-11-12 DIAGNOSIS — G508 Other disorders of trigeminal nerve: Secondary | ICD-10-CM | POA: Diagnosis not present

## 2020-11-26 DIAGNOSIS — F4522 Body dysmorphic disorder: Secondary | ICD-10-CM | POA: Diagnosis not present

## 2020-11-26 DIAGNOSIS — F424 Excoriation (skin-picking) disorder: Secondary | ICD-10-CM | POA: Diagnosis not present

## 2020-11-26 DIAGNOSIS — F422 Mixed obsessional thoughts and acts: Secondary | ICD-10-CM | POA: Diagnosis not present

## 2020-12-02 ENCOUNTER — Ambulatory Visit
Admission: RE | Admit: 2020-12-02 | Discharge: 2020-12-02 | Disposition: A | Discharge status: Home / Self Care | Payer: PPO | Source: Ambulatory Visit | Attending: Adult Health | Admitting: Adult Health

## 2020-12-02 ENCOUNTER — Other Ambulatory Visit: Payer: Self-pay

## 2020-12-02 DIAGNOSIS — Z1231 Encounter for screening mammogram for malignant neoplasm of breast: Secondary | ICD-10-CM

## 2020-12-03 DIAGNOSIS — F424 Excoriation (skin-picking) disorder: Secondary | ICD-10-CM | POA: Diagnosis not present

## 2020-12-03 DIAGNOSIS — F422 Mixed obsessional thoughts and acts: Secondary | ICD-10-CM | POA: Diagnosis not present

## 2020-12-03 DIAGNOSIS — F4522 Body dysmorphic disorder: Secondary | ICD-10-CM | POA: Diagnosis not present

## 2020-12-04 ENCOUNTER — Other Ambulatory Visit: Payer: Self-pay | Admitting: Adult Health

## 2020-12-04 DIAGNOSIS — R928 Other abnormal and inconclusive findings on diagnostic imaging of breast: Secondary | ICD-10-CM

## 2020-12-10 DIAGNOSIS — F424 Excoriation (skin-picking) disorder: Secondary | ICD-10-CM | POA: Diagnosis not present

## 2020-12-10 DIAGNOSIS — F4522 Body dysmorphic disorder: Secondary | ICD-10-CM | POA: Diagnosis not present

## 2020-12-10 DIAGNOSIS — F422 Mixed obsessional thoughts and acts: Secondary | ICD-10-CM | POA: Diagnosis not present

## 2020-12-16 DIAGNOSIS — F422 Mixed obsessional thoughts and acts: Secondary | ICD-10-CM | POA: Diagnosis not present

## 2020-12-16 DIAGNOSIS — F424 Excoriation (skin-picking) disorder: Secondary | ICD-10-CM | POA: Diagnosis not present

## 2020-12-16 DIAGNOSIS — F4522 Body dysmorphic disorder: Secondary | ICD-10-CM | POA: Diagnosis not present

## 2020-12-17 ENCOUNTER — Telehealth: Payer: Self-pay | Admitting: Adult Health

## 2020-12-17 ENCOUNTER — Other Ambulatory Visit: Payer: Self-pay | Admitting: Adult Health

## 2020-12-17 DIAGNOSIS — J310 Chronic rhinitis: Secondary | ICD-10-CM

## 2020-12-17 NOTE — Telephone Encounter (Signed)
patient called to request referral to an Allergist, to evaluate sneezing episodes. Per patient , she has several episodes of sneezing daily, will sneeze 6x per episode.  Referral recommended to her by therapist who is evaluating patient for surgical clearance for procedure with plastic surgery. Patient has seen ENT, but recommends Allergist referral also. Please advise.

## 2020-12-20 ENCOUNTER — Ambulatory Visit
Admission: RE | Admit: 2020-12-20 | Discharge: 2020-12-20 | Disposition: A | Discharge status: Home / Self Care | Payer: PPO | Source: Ambulatory Visit | Attending: Adult Health | Admitting: Adult Health

## 2020-12-20 ENCOUNTER — Other Ambulatory Visit: Payer: Self-pay

## 2020-12-20 DIAGNOSIS — R922 Inconclusive mammogram: Secondary | ICD-10-CM | POA: Diagnosis not present

## 2020-12-20 DIAGNOSIS — R928 Other abnormal and inconclusive findings on diagnostic imaging of breast: Secondary | ICD-10-CM

## 2020-12-23 DIAGNOSIS — F422 Mixed obsessional thoughts and acts: Secondary | ICD-10-CM | POA: Diagnosis not present

## 2020-12-23 DIAGNOSIS — F424 Excoriation (skin-picking) disorder: Secondary | ICD-10-CM | POA: Diagnosis not present

## 2020-12-23 DIAGNOSIS — F4522 Body dysmorphic disorder: Secondary | ICD-10-CM | POA: Diagnosis not present

## 2020-12-24 ENCOUNTER — Telehealth: Payer: Self-pay

## 2020-12-24 NOTE — Progress Notes (Signed)
Assessment and Plan:  There are no diagnoses linked to this encounter.    Further disposition pending results of labs. Discussed med's effects and SE's.   Over 30 minutes of exam, counseling, chart review, and critical decision making was performed.   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  Pt states she is having no irritation, itching, is not using any irritant chemicals on the vaginal area.  Pt states she does use cottonelle wet wipes after urination and bowel movements.  She is following with therapist and is being referred to psychiatrist for medication management for OCD behaviors.  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 Continues to have very poor vision left eye from surgeries for macular hole and has spot in vision from hole in right .  Unable to drive which has made her OCD initially worse but has gotten internet and [employer] so has more entertainment options.  Past Medical History:  Diagnosis Date   Anxiety    situational   Arthritis    B12 deficiency    hx of , no longer taking medication   Cataracts, bilateral    Constipation    Depression    situational   Esophageal stricture    Family history of adverse reaction to anesthesia    mom had problems waking up   Fatty liver    GERD  (gastroesophageal reflux disease)    Headache    migraines    Macular hole of left eye    Retinal detachment with multiple breaks, right 07/27/2017   Trigeminal neuralgia    right side of face is numb   Unspecified vitamin D deficiency      Allergies  Allergen Reactions   Benadryl [Diphenhydramine]     Severe headaches    Lyrica [Pregabalin] Swelling   Nitrous Oxide     Pt has gas bubble in right eye- will cause blindness per pt    Sulfa Antibiotics Other (See Comments)    Headache    Current Outpatient Medications on File Prior to Visit  Medication Sig   Ascorbic Acid (VITAMIN C PO) Take by mouth daily.   aspirin-acetaminophen-caffeine (EXCEDRIN EXTRA STRENGTH) 250-250-65 MG tablet Take 1 tablet by mouth every 6 (six) hours as needed for headache.   escitalopram (LEXAPRO) 10 MG tablet Take 1 tab daily for mood.   esomeprazole (NEXIUM) 40 MG capsule Take 1 capsule (40 mg total) by mouth daily. (Patient taking differently: Take 40 mg by mouth daily. Taking twice a day per patient)   Magnesium 200 MG CHEW Chew by mouth.   Multiple Vitamins-Minerals (MULTIVITAMIN WITH MINERALS) tablet Take 1 tablet by mouth daily.   Polyethylene Glycol 3350 (MIRALAX PO) Take by mouth.   vitamin B-12 (CYANOCOBALAMIN) 500 MCG tablet Take 500 mcg by mouth daily.   VITAMIN D PO Take 5,000 Units by mouth daily.  acetaminophen (TYLENOL) 500 MG tablet Take 1,000 mg by mouth 2 (two) times daily as needed for moderate pain or headache.    COLLAGEN PO Take by mouth.   meclizine (ANTIVERT) 12.5 MG tablet Take 1 tablet (12.5 mg total) by mouth 3 (three) times daily as needed for dizziness. (Patient not taking: Reported on 12/25/2020)   ondansetron (ZOFRAN) 4 MG tablet Take 1 tablet (4 mg total) by mouth every 6 (six) hours.   OVER THE COUNTER MEDICATION Hair, skin and nails (Patient not taking: Reported on 12/25/2020)   No current facility-administered medications on file prior to visit.    ROS: all negative  except above.   Physical Exam:  BP 110/62   Pulse 77   Temp (!) 97.5 F (36.4 C)   Wt 196 lb 6.4 oz (89.1 kg)   SpO2 96%   BMI 32.68 kg/m   General Appearance: Well nourished, in no apparent distress. Eyes: PERRLA, EOMs, conjunctiva no swelling or erythema Sinuses: No Frontal/maxillary tenderness ENT/Mouth: Ext aud canals clear, TMs without erythema, bulging. No erythema, swelling, or exudate on post pharynx.  Tonsils not swollen or erythematous. Hearing normal.  Neck: Supple, thyroid normal.  Respiratory: Respiratory effort normal, BS equal bilaterally without rales, rhonchi, wheezing or stridor.  Cardio: RRR with no MRGs. Brisk peripheral pulses without edema.  Abdomen: Soft, + BS.  Non tender, no guarding, rebound, hernias, masses. Lymphatics: Non tender without lymphadenopathy.  Musculoskeletal: Full ROM, 5/5 strength, normal gait.  Skin: Warm, dry without rashes, lesions, ecchymosis.  Neuro: Cranial nerves intact. Normal muscle tone, no cerebellar symptoms. Sensation intact.  Psych: Awake and oriented X 3, normal affect, Insight and Judgment appropriate.     Magda Bernheim, NP 11:11 AM [facility]

## 2020-12-24 NOTE — Telephone Encounter (Signed)
Therapist called and requesting for [place] to see [place]. Patient is diagnosed with OCD, Excoriation and Body Dysmorphic Disorder. Major concern about causing harm to genital area using Bleach and Peroxide.  Appointment made to see Hinton Dyer on 12/25/20

## 2020-12-25 ENCOUNTER — Other Ambulatory Visit: Payer: Self-pay

## 2020-12-25 ENCOUNTER — Encounter: Payer: Self-pay | Admitting: Nurse Practitioner

## 2020-12-25 ENCOUNTER — Ambulatory Visit (INDEPENDENT_AMBULATORY_CARE_PROVIDER_SITE_OTHER): Payer: PPO | Admitting: Nurse Practitioner

## 2020-12-25 VITALS — BP 110/62 | HR 77 | Temp 97.5°F | Wt 196.4 lb

## 2020-12-25 DIAGNOSIS — M950 Acquired deformity of nose: Secondary | ICD-10-CM

## 2020-12-25 DIAGNOSIS — H35342 Macular cyst, hole, or pseudohole, left eye: Secondary | ICD-10-CM | POA: Diagnosis not present

## 2020-12-25 DIAGNOSIS — F424 Excoriation (skin-picking) disorder: Secondary | ICD-10-CM | POA: Diagnosis not present

## 2020-12-25 DIAGNOSIS — H35341 Macular cyst, hole, or pseudohole, right eye: Secondary | ICD-10-CM

## 2021-01-01 DIAGNOSIS — F4522 Body dysmorphic disorder: Secondary | ICD-10-CM | POA: Diagnosis not present

## 2021-01-01 DIAGNOSIS — F424 Excoriation (skin-picking) disorder: Secondary | ICD-10-CM | POA: Diagnosis not present

## 2021-01-01 DIAGNOSIS — F422 Mixed obsessional thoughts and acts: Secondary | ICD-10-CM | POA: Diagnosis not present

## 2021-01-06 DIAGNOSIS — F4522 Body dysmorphic disorder: Secondary | ICD-10-CM | POA: Diagnosis not present

## 2021-01-06 DIAGNOSIS — F422 Mixed obsessional thoughts and acts: Secondary | ICD-10-CM | POA: Diagnosis not present

## 2021-01-06 DIAGNOSIS — F424 Excoriation (skin-picking) disorder: Secondary | ICD-10-CM | POA: Diagnosis not present

## 2021-01-08 ENCOUNTER — Other Ambulatory Visit: Payer: Self-pay | Admitting: Adult Health

## 2021-01-08 ENCOUNTER — Telehealth: Payer: Self-pay

## 2021-01-08 DIAGNOSIS — F331 Major depressive disorder, recurrent, moderate: Secondary | ICD-10-CM

## 2021-01-08 DIAGNOSIS — F419 Anxiety disorder, unspecified: Secondary | ICD-10-CM

## 2021-01-08 NOTE — Telephone Encounter (Signed)
Requesting a referral for Psychiatry.

## 2021-01-28 NOTE — Progress Notes (Signed)
CPE  Assessment and Plan:   Encounter for Annual Physical Exam with abnormal findings Due annually  Health Maintenance reviewed Healthy lifestyle reviewed and goals set -  Bone density - defer per patient  Vitamin D deficiency Continue supplement for goal 50-100 range  Hx of prediabetes Discussed disease and risks Discussed diet/exercise, weight management  Check A1C annually and as needed  Obesity (BMI 30.0-34.9) Long discussion about weight loss, diet, and exercise Recommended diet heavy in fruits and veggies and low in animal meats, cheeses, and dairy products, appropriate calorie intake Discussed appropriate weight for height Follow up at next visit  Macular hole, right eye/left eye Followed by ophthalmology - declining further surgeries at this time  Medication management Due twice annually with routine visits  RBBB Monitor, avoid rate controlling agents  Acquired deformity of nose Discussing with [facility] plastics; avoid nasal steroids/meds  Major depressive disorder, moderate, current (HCC) Now on lexapro 10 mg daily, may benefit from addition of wellbutrin, she has appointment  Lifestyle discussed: diet/exerise, sleep hygiene, stress management, hydration  B12 Recheck levels, continue supplement as indicated  Orders Placed This Encounter  Procedures   CBC with Differential/Platelet   COMPLETE METABOLIC PANEL WITH GFR   Magnesium   Lipid panel   TSH   Hemoglobin A1c   VITAMIN D 25 Hydroxy (Vit-D Deficiency, Fractures)   Microalbumin / creatinine urine ratio   Urinalysis, Routine w reflex microscopic   Vitamin B12   EKG 12-Lead    Discussed med's effects and SE's. Screening labs and tests as requested with regular follow-up as recommended. Over 40 minutes of exam, counseling, chart review, and complex, high level critical decision making was performed this visit.   Future Appointments  Date Time Provider [place]  03/04/2021  1:30 PM  Valentina Shaggy, MD [facility] None  03/12/2021  9:00 AM [name], MD [facility] None  07/10/2021 10:30 AM [name], NP [facility] None  01/29/2022 10:00 AM [name], NP [facility] None     Plan:   During the course of the visit the patient was educated and counseled about appropriate screening and preventive services including:   Pneumococcal vaccine  Prevnar 13 Influenza vaccine Td vaccine Screening electrocardiogram Bone densitometry screening Colorectal cancer screening Diabetes screening Glaucoma screening Nutrition counseling  Advanced directives: requested    HPI  65 y.o. female  presents for CPE. She  has Depression, major, recurrent ([place]); Vitamin D deficiency; Anxiety; Prediabetes; Macular hole, left eye; Macular hole, right eye; Obesity (BMI 30.0-34.9); Right bundle branch block (RBBB); Chronic rhinitis; and Nasal deformity, acquired on their problem list.   She is single. She is currently out of work due to multiple eye procedures (macular holes, surgery x 3), no longer driving, daughter or mom drives her if needed. Previously worked as Building control surveyor at [employer].   She has hx of depression/anxiety, newly following with therapist and is being referred to psychiatrist for medication management for OCD behaviors.    She has acquired nasal deformity following steroid injections remotely for possible discoid lupus, now has chronic deformity, nasal discharge and nasal crusting, saw ENT in [facility] [name], was referred to cosmetic surgeon and allergist.  Some question of habitual self excoriation of nose, OCD tendencies, was referred for therapy and pscyh. Hx of depression/anxiety as well. She was advised need for complete therapy (1 year) before she can have plastic surgery [name] at [facility]. Currently on lexpro but feels had more energy/motivation with wellbutrin, has appointment  this afternoon to discuss.   Has history of GERD and has  had EGD/dilitation in 2015 and again in 2019. She reports manages by avoiding trigger foods.   BMI is Body mass index is 33.27 kg/m., she has been working on diet and exercise, working up on steps, using fit bit and doing stepping video. Admits not as good with diet. Trying to drink more fluids, doing crystal lite.  Wt Readings from Last 3 Encounters:  01/29/21 198 lb 6.4 oz (90 kg)  12/25/20 196 lb 6.4 oz (89.1 kg)  07/10/20 196 lb (88.9 kg)   Today their BP is BP: 106/66 She does workout. She denies chest pain, shortness of breath, dizziness.   She is not on cholesterol medication and denies myalgias. Her cholesterol is not at goal. The cholesterol last visit was:   Lab Results  Component Value Date   CHOL 180 12/16/2017   HDL 54 12/16/2017   LDLCALC 104 (H) 12/16/2017   TRIG 120 12/16/2017   CHOLHDL 3.3 12/16/2017   She has been working on diet and exercise for hx oprediabetes, she is not on bASA, she is not on ACE/ARB and denies foot ulcerations, increased appetite, nausea, paresthesia of the feet, polydipsia, polyuria, vomiting and weight loss. Last A1C in the office was:  Lab Results  Component Value Date   HGBA1C 5.6 12/16/2017   Last GFR: Lab Results  Component Value Date   GFRNONAA 80 07/10/2020   Patient is on Vitamin D supplement, taking 2000 IU daily   Lab Results  Component Value Date   VD25OH 26 07/10/2020     She takes chewable supplement daily, feels helps her energy, has done shots in the past Lab Results  Component Value Date   VITAMINB12 >2000 (H) 05/31/2013     Current Medications:  Current Outpatient Medications on File Prior to Visit  Medication Sig Dispense Refill   Ascorbic Acid (VITAMIN C PO) Take by mouth daily.     aspirin-acetaminophen-caffeine (EXCEDRIN EXTRA STRENGTH) 250-250-65 MG tablet Take 1 tablet by mouth every 6 (six) hours as needed for headache.     escitalopram (LEXAPRO) 10 MG tablet Take 1 tab daily for mood. 90 tablet 1    Esomeprazole Magnesium (NEXIUM PO) Take by mouth daily.     Fexofenadine HCl (ALLERGY 24-HR PO) Take by mouth daily.     fluticasone (FLONASE) 50 MCG/ACT nasal spray Place into both nostrils daily.     Magnesium 200 MG CHEW Chew by mouth.     Multiple Vitamins-Minerals (MULTIVITAMIN WITH MINERALS) tablet Take 1 tablet by mouth daily.     Polyethylene Glycol 3350 (MIRALAX PO) Take by mouth.     vitamin B-12 (CYANOCOBALAMIN) 500 MCG tablet Take 500 mcg by mouth daily.     VITAMIN D PO Take 2,000 Units by mouth daily.     No current facility-administered medications on file prior to visit.   Allergies:  Allergies  Allergen Reactions   Benadryl [Diphenhydramine]     Severe headaches    Lyrica [Pregabalin] Swelling   Nitrous Oxide     Pt has gas bubble in right eye- will cause blindness per pt    Sulfa Antibiotics Other (See Comments)    Headache   Medical History:  She has Depression, major, recurrent ([place]); Vitamin D deficiency; Anxiety; Prediabetes; Macular hole, left eye; Macular hole, right eye; Obesity (BMI 30.0-34.9); Right bundle branch block (RBBB); Chronic rhinitis; and Nasal deformity, acquired on their problem list. Health Maintenance:   Immunization  History  Administered Date(s) Administered   Influenza-Unspecified 02/15/2018, 02/16/2020   PPD Test 05/31/2013   Pneumococcal Polysaccharide-23 07/10/2020   Tdap 05/31/2013    Tetanus: 2015 Pneumovax: 06/2020 Prevnar 13: defer Flu vaccine: 02/16/2020, will get in Oct Shingrix: check with insurance  Covid 19: declines  Pap: Remote, never abnormal, s/p TAH, declines further unless problems MGM: 12/20/2020 DEXA: has had, ?2009, normal, postpone for now per pt pref  Colonoscopy: 2015, Dr. Fuller Plan, 10 year recall  EGD: 2019  Last Dental Exam: Dr. Luan Pulling in [place], last visit 2018, encouraged to schedule Last Eye Exam: Dr. Tempie Hoist, [facility], last visit 2022, goes q59m Patient Care  Team: MUnk Pinto MD as PCP - General (Internal Medicine)  Surgical History:  She has a past surgical history that includes Abdominal hysterectomy; Cesarean section; trigeminal ; Appendectomy; knot removed from right side of abdomen; Colonoscopy; 25 gauge pars plana vitrectomy with 20 gauge mvr port for macular hole (Left, 03/05/2015); Membrane peel (Left, 03/05/2015); Serum patch (Left, 03/05/2015); Laser photo ablation (Left, 03/05/2015); Gas/fluid exchange (Left, 03/05/2015); 25 gauge pars plana vitrectomy with 20 gauge mvr port for macular hole (Right, 06/22/2017); 25 gauge pars plana vitrectomy with 20 gauge mvr port for macular hole (Right, 06/22/2017); Gas/fluid exchange (Right, 06/22/2017); Laser photo ablation (Right, 06/22/2017); Serum patch (Right, 06/22/2017); Membrane peel (Right, 06/22/2017); Scleral buckle with possible 25 gauge pars plana vitrectomy (Right, 07/27/2017); Laser photo ablation (Right, 07/27/2017); Gas/fluid exchange (Right, 07/27/2017); Membrane peel (Right, 07/27/2017); 25 gauge pars plana vitrectomy with 20 gauge mvr port (Left, 11/09/2017); Esophagogastroduodenoscopy (egd) with esophageal dilation; Eye surgery; 25 gauge pars plana vitrectomy with 20 gauge mvr port (Left, 11/09/2017); Membrane peel (Left, 11/09/2017); Gas/fluid exchange (Left, 11/09/2017); Serum patch (Left, 11/09/2017); and Laser photo ablation (Left, 11/09/2017). Family History:  Herfamily history includes Alzheimer's disease in her maternal aunt and maternal grandmother; Bone cancer in her maternal grandfather; Breast cancer in her maternal aunt; COPD in her mother; Cancer in her paternal uncle; Colon cancer in her paternal uncle; Diabetes in her mother; Heart disease in her father; Hodgkin's lymphoma in her paternal aunt; Hyperlipidemia in her father and mother; Hypertension in her brother and father; Pancreatic cancer (age of onset: 630 in her father; Prostate cancer in her maternal grandfather. Social History:  She  reports that she has never smoked. She has never used smokeless tobacco. She reports that she does not drink alcohol and does not use drugs.   Review of Systems: Review of Systems  Constitutional:  Negative for malaise/fatigue and weight loss.  HENT:  Negative for hearing loss and tinnitus.        Chronic rhinitis  Eyes:  Negative for blurred vision and double vision.  Respiratory:  Negative for cough, sputum production, shortness of breath and wheezing.   Cardiovascular:  Negative for chest pain, palpitations, orthopnea, claudication, leg swelling and PND.  Gastrointestinal:  Negative for abdominal pain, blood in stool, constipation, diarrhea, heartburn, melena, nausea and vomiting.  Genitourinary: Negative.   Musculoskeletal:  Negative for falls, joint pain and myalgias.  Skin:  Negative for rash.  Neurological:  Negative for dizziness, tingling, sensory change, weakness and headaches.  Endo/Heme/Allergies:  Negative for environmental allergies and polydipsia.  Psychiatric/Behavioral:  Positive for depression. Negative for memory loss, substance abuse and suicidal ideas. The patient is not nervous/anxious and does not have insomnia.   All other systems reviewed and are negative.  Physical Exam: Estimated body mass index is 33.27 kg/m as calculated from  the following:   Height as of this encounter: 5' 4.75" (1.645 m).   Weight as of this encounter: 198 lb 6.4 oz (90 kg). BP 106/66   Pulse 79   Temp (!) 97.3 F (36.3 C)   Ht 5' 4.75" (1.645 m)   Wt 198 lb 6.4 oz (90 kg)   SpO2 98%   BMI 33.27 kg/m  General Appearance: Well nourished, well dressed elder female, in no apparent distress.  Eyes: R pupil minimally reactive, left round and reactive, EOMs, conjunctiva no swelling or erythema Sinuses: No Frontal/maxillary tenderness  ENT/Mouth: Ext aud canals clear, normal light reflex with TMs without erythema, bulging. Good dentition. No erythema, swelling, or exudate on post pharynx.  Tonsils not swollen or erythematous. Hearing normal. R nare chronically deformed/absent; no open wounds or ulcerations; clear crusty discharge.  Neck: Supple, thyroid normal. No bruits  Respiratory: Respiratory effort normal, BS equal bilaterally without rales, rhonchi, wheezing or stridor.  Cardio: RRR without murmurs, rubs or gallops. Brisk peripheral pulses without edema.  Chest: symmetric, with normal excursions and percussion.  Abdomen: Soft, nontender, no guarding, rebound, hernias, masses, or organomegaly.  Lymphatics: Non tender without lymphadenopathy.  Musculoskeletal: Full ROM all peripheral extremities,5/5 strength, and normal gait.  Skin: Warm, dry without rashes, lesions, ecchymosis. 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 Normal muscle tone, no cerebellar symptoms. Sensation intact.  Psych: Awake and oriented X 3, normal affect, Insight and Judgment appropriate.  Breasts: recent mammogram, no concerns, defer GU: s/p TAH, denies concerns, defer  EKG: NSR, IRBBB    Gorden Harms Lavern Maslow 10:52 AM [facility]

## 2021-01-29 ENCOUNTER — Ambulatory Visit (INDEPENDENT_AMBULATORY_CARE_PROVIDER_SITE_OTHER): Payer: PPO | Admitting: Adult Health

## 2021-01-29 ENCOUNTER — Encounter: Payer: Self-pay | Admitting: Adult Health

## 2021-01-29 ENCOUNTER — Other Ambulatory Visit: Payer: Self-pay

## 2021-01-29 VITALS — BP 106/66 | HR 79 | Temp 97.3°F | Ht 64.75 in | Wt 198.4 lb

## 2021-01-29 DIAGNOSIS — J310 Chronic rhinitis: Secondary | ICD-10-CM

## 2021-01-29 DIAGNOSIS — M950 Acquired deformity of nose: Secondary | ICD-10-CM

## 2021-01-29 DIAGNOSIS — Z0001 Encounter for general adult medical examination with abnormal findings: Secondary | ICD-10-CM | POA: Diagnosis not present

## 2021-01-29 DIAGNOSIS — F424 Excoriation (skin-picking) disorder: Secondary | ICD-10-CM | POA: Diagnosis not present

## 2021-01-29 DIAGNOSIS — Z0000 Encounter for general adult medical examination without abnormal findings: Secondary | ICD-10-CM | POA: Diagnosis not present

## 2021-01-29 DIAGNOSIS — R7303 Prediabetes: Secondary | ICD-10-CM

## 2021-01-29 DIAGNOSIS — I10 Essential (primary) hypertension: Secondary | ICD-10-CM

## 2021-01-29 DIAGNOSIS — Z79899 Other long term (current) drug therapy: Secondary | ICD-10-CM

## 2021-01-29 DIAGNOSIS — E785 Hyperlipidemia, unspecified: Secondary | ICD-10-CM

## 2021-01-29 DIAGNOSIS — Z1389 Encounter for screening for other disorder: Secondary | ICD-10-CM | POA: Diagnosis not present

## 2021-01-29 DIAGNOSIS — F331 Major depressive disorder, recurrent, moderate: Secondary | ICD-10-CM

## 2021-01-29 DIAGNOSIS — E669 Obesity, unspecified: Secondary | ICD-10-CM

## 2021-01-29 DIAGNOSIS — Z1329 Encounter for screening for other suspected endocrine disorder: Secondary | ICD-10-CM | POA: Diagnosis not present

## 2021-01-29 DIAGNOSIS — R7309 Other abnormal glucose: Secondary | ICD-10-CM | POA: Diagnosis not present

## 2021-01-29 DIAGNOSIS — I4510 Unspecified right bundle-branch block: Secondary | ICD-10-CM | POA: Diagnosis not present

## 2021-01-29 DIAGNOSIS — Z136 Encounter for screening for cardiovascular disorders: Secondary | ICD-10-CM

## 2021-01-29 DIAGNOSIS — F411 Generalized anxiety disorder: Secondary | ICD-10-CM | POA: Diagnosis not present

## 2021-01-29 DIAGNOSIS — E538 Deficiency of other specified B group vitamins: Secondary | ICD-10-CM

## 2021-01-29 DIAGNOSIS — E559 Vitamin D deficiency, unspecified: Secondary | ICD-10-CM

## 2021-01-29 DIAGNOSIS — Q899 Congenital malformation, unspecified: Secondary | ICD-10-CM | POA: Diagnosis not present

## 2021-01-29 DIAGNOSIS — F419 Anxiety disorder, unspecified: Secondary | ICD-10-CM

## 2021-01-29 DIAGNOSIS — F332 Major depressive disorder, recurrent severe without psychotic features: Secondary | ICD-10-CM | POA: Diagnosis not present

## 2021-01-29 NOTE — Patient Instructions (Addendum)
Ms. Margaret Gilmore , Thank you for taking time to come for your Annual Wellness Visit. I appreciate your ongoing commitment to your health goals. Please review the following plan we discussed and let me know if I can assist you in the future.   These are the goals we discussed:  Goals      DIET - EAT MORE FRUITS AND VEGETABLES     DIET - INCREASE WATER INTAKE        This is a list of the screening recommended for you and due dates:  Health Maintenance  Topic Date Due   Zoster (Shingles) Vaccine (1 of 2) Never done   COVID-19 Vaccine (1) 02/14/2021*   Flu Shot  02/15/2021*   Pneumonia vaccines (2 of 2 - PCV13) 07/10/2021   Mammogram  12/21/2022   Tetanus Vaccine  06/01/2023   Colon Cancer Screening  08/26/2023   DEXA scan (bone density measurement)  Completed   Hepatitis C Screening: USPSTF Recommendation to screen - Ages 61-79 yo.  Completed   HPV Vaccine  Aged Out  *Topic was postponed. The date shown is not the original due date.       Previously was on wellbutrin XR 150 mg - had more energy/motivation    Check with insurance if they cover shingrix vaccine     Know what a healthy weight is for you (roughly BMI <25) and aim to maintain this  Aim for 7+ servings of fruits and vegetables daily  65-80+ fluid ounces of water or unsweet tea for healthy kidneys  Limit to max 1 drink of alcohol per day; avoid smoking/tobacco  Limit animal fats in diet for cholesterol and heart health - choose grass fed whenever available  Avoid highly processed foods, and foods high in saturated/trans fats  Aim for low stress - take time to unwind and care for your mental health  Aim for 150 min of moderate intensity exercise weekly for heart health, and weights twice weekly for bone health  Aim for 7-9 hours of sleep daily     High-Fiber Eating Plan Fiber, also called dietary fiber, is a type of carbohydrate. It is found foods such as fruits, vegetables, whole grains, and beans. A  high-fiber diet can have many health benefits. Your health care provider may recommend a high-fiber diet to help: Prevent constipation. Fiber can make your bowel movements more regular. Lower your cholesterol. Relieve the following conditions: Inflammation of veins in the anus (hemorrhoids). Inflammation of specific areas of the digestive tract (uncomplicated diverticulosis). A problem of the large intestine, also called the colon, that sometimes causes pain and diarrhea (irritable bowel syndrome, or IBS). Prevent overeating as part of a weight-loss plan. Prevent heart disease, type 2 diabetes, and certain cancers. What are tips for following this plan? Reading food labels  Check the nutrition facts label on food products for the amount of dietary fiber. Choose foods that have 5 grams of fiber or more per serving. The goals for recommended daily fiber intake include: Men (age 61 or younger): 34-38 g. Men (over age 38): 28-34 g. Women (age 69 or younger): 25-28 g. Women (over age 71): 22-25 g. Your daily fiber goal is _____________ g. Shopping Choose whole fruits and vegetables instead of processed forms, such as apple juice or applesauce. Choose a wide variety of high-fiber foods such as avocados, lentils, oats, and kidney beans. Read the nutrition facts label of the foods you choose. Be aware of foods with added fiber. These foods often have  high sugar and sodium amounts per serving. Cooking Use whole-grain flour for baking and cooking. Cook with brown rice instead of white rice. Meal planning Start the day with a breakfast that is high in fiber, such as a cereal that contains 5 g of fiber or more per serving. Eat breads and cereals that are made with whole-grain flour instead of refined flour or white flour. Eat brown rice, bulgur wheat, or millet instead of white rice. Use beans in place of meat in soups, salads, and pasta dishes. Be sure that half of the grains you eat each day are  whole grains. General information You can get the recommended daily intake of dietary fiber by: Eating a variety of fruits, vegetables, grains, nuts, and beans. Taking a fiber supplement if you are not able to take in enough fiber in your diet. It is better to get fiber through food than from a supplement. Gradually increase how much fiber you consume. If you increase your intake of dietary fiber too quickly, you may have bloating, cramping, or gas. Drink plenty of water to help you digest fiber. Choose high-fiber snacks, such as berries, raw vegetables, nuts, and popcorn. What foods should I eat? Fruits Berries. Pears. Apples. Oranges. Avocado. Prunes and raisins. Dried figs. Vegetables Sweet potatoes. Spinach. Kale. Artichokes. Cabbage. Broccoli. Cauliflower. Green peas. Carrots. Squash. Grains Whole-grain breads. Multigrain cereal. Oats and oatmeal. Brown rice. Barley. Bulgur wheat. [place]. Quinoa. Bran muffins. Popcorn. Rye wafer crackers. Meats and other proteins Navy beans, kidney beans, and pinto beans. Soybeans. Split peas. Lentils. Nuts and seeds. Dairy Fiber-fortified yogurt. Beverages Fiber-fortified soy milk. Fiber-fortified orange juice. Other foods Fiber bars. The items listed above may not be a complete list of recommended foods and beverages. Contact a dietitian for more information. What foods should I avoid? Fruits Fruit juice. Cooked, strained fruit. Vegetables Fried potatoes. Canned vegetables. Well-cooked vegetables. Grains White bread. Pasta made with refined flour. White rice. Meats and other proteins Fatty cuts of meat. Fried chicken or fried fish. Dairy Milk. Yogurt. Cream cheese. Sour cream. Fats and oils Butters. Beverages Soft drinks. Other foods Cakes and pastries. The items listed above may not be a complete list of foods and beverages to avoid. Talk with your dietitian about what choices are best for you. Summary Fiber is a type of  carbohydrate. It is found in foods such as fruits, vegetables, whole grains, and beans. A high-fiber diet has many benefits. It can help to prevent constipation, lower blood cholesterol, aid weight loss, and reduce your risk of heart disease, diabetes, and certain cancers. Increase your intake of fiber gradually. Increasing fiber too quickly may cause cramping, bloating, and gas. Drink plenty of water while you increase the amount of fiber you consume. The best sources of fiber include whole fruits and vegetables, whole grains, nuts, seeds, and beans. This information is not intended to replace advice given to you by your health care provider. Make sure you discuss any questions you have with your health care provider. Document Revised: 09/07/2019 Document Reviewed: 09/07/2019 Elsevier Patient Education  2022 [employer].

## 2021-01-30 ENCOUNTER — Encounter: Payer: Self-pay | Admitting: Adult Health

## 2021-01-30 DIAGNOSIS — E785 Hyperlipidemia, unspecified: Secondary | ICD-10-CM | POA: Insufficient documentation

## 2021-01-30 LAB — URINALYSIS, ROUTINE W REFLEX MICROSCOPIC
Bilirubin Urine: NEGATIVE
Glucose, UA: NEGATIVE
Hgb urine dipstick: NEGATIVE
Ketones, ur: NEGATIVE
Leukocytes,Ua: NEGATIVE
Nitrite: NEGATIVE
Protein, ur: NEGATIVE
Specific Gravity, Urine: 1.023 (ref 1.001–1.035)
pH: 5.5 (ref 5.0–8.0)

## 2021-01-30 LAB — COMPLETE METABOLIC PANEL WITH GFR
AG Ratio: 1.5 (calc) (ref 1.0–2.5)
ALT: 8 U/L (ref 6–29)
AST: 12 U/L (ref 10–35)
Albumin: 4.2 g/dL (ref 3.6–5.1)
Alkaline phosphatase (APISO): 70 U/L (ref 37–153)
BUN: 19 mg/dL (ref 7–25)
CO2: 27 mmol/L (ref 20–32)
Calcium: 9.3 mg/dL (ref 8.6–10.4)
Chloride: 107 mmol/L (ref 98–110)
Creat: 0.82 mg/dL (ref 0.50–1.05)
Globulin: 2.8 g/dL (calc) (ref 1.9–3.7)
Glucose, Bld: 72 mg/dL (ref 65–99)
Potassium: 4 mmol/L (ref 3.5–5.3)
Sodium: 141 mmol/L (ref 135–146)
Total Bilirubin: 0.4 mg/dL (ref 0.2–1.2)
Total Protein: 7 g/dL (ref 6.1–8.1)
eGFR: 79 mL/min/{1.73_m2} (ref >=60)

## 2021-01-30 LAB — CBC WITH DIFFERENTIAL/PLATELET
Absolute Monocytes: 460 cells/uL (ref 200–950)
Basophils Absolute: 47 cells/uL (ref 0–200)
Basophils Relative: 0.6 %
Eosinophils Absolute: 78 cells/uL (ref 15–500)
Eosinophils Relative: 1 %
HCT: 40.5 % (ref 35.0–45.0)
Hemoglobin: 13 g/dL (ref 11.7–15.5)
Lymphs Abs: 2746 cells/uL (ref 850–3900)
MCH: 28.4 pg (ref 27.0–33.0)
MCHC: 32.1 g/dL (ref 32.0–36.0)
MCV: 88.4 fL (ref 80.0–100.0)
MPV: 10.2 fL (ref 7.5–12.5)
Monocytes Relative: 5.9 %
Neutro Abs: 4469 cells/uL (ref 1500–7800)
Neutrophils Relative %: 57.3 %
Platelets: 340 10*3/uL (ref 140–400)
RBC: 4.58 10*6/uL (ref 3.80–5.10)
RDW: 13.1 % (ref 11.0–15.0)
Total Lymphocyte: 35.2 %
WBC: 7.8 10*3/uL (ref 3.8–10.8)

## 2021-01-30 LAB — LIPID PANEL
Cholesterol: 203 mg/dL — ABNORMAL HIGH (ref <200)
HDL: 47 mg/dL — ABNORMAL LOW (ref >=50)
LDL Cholesterol (Calc): 126 mg/dL (calc) — ABNORMAL HIGH
Non-HDL Cholesterol (Calc): 156 mg/dL (calc) — ABNORMAL HIGH (ref <130)
Total CHOL/HDL Ratio: 4.3 (calc) (ref <5.0)
Triglycerides: 180 mg/dL — ABNORMAL HIGH (ref <150)

## 2021-01-30 LAB — HEMOGLOBIN A1C
Hgb A1c MFr Bld: 5.6 % of total Hgb (ref <5.7)
Mean Plasma Glucose: 114 mg/dL
eAG (mmol/L): 6.3 mmol/L

## 2021-01-30 LAB — MICROALBUMIN / CREATININE URINE RATIO
Creatinine, Urine: 171 mg/dL (ref 20–275)
Microalb Creat Ratio: 4 mcg/mg creat (ref <30)
Microalb, Ur: 0.7 mg/dL

## 2021-01-30 LAB — VITAMIN D 25 HYDROXY (VIT D DEFICIENCY, FRACTURES): Vit D, 25-Hydroxy: 61 ng/mL (ref 30–100)

## 2021-01-30 LAB — VITAMIN B12: Vitamin B-12: 1130 pg/mL — ABNORMAL HIGH (ref 200–1100)

## 2021-01-30 LAB — TSH: TSH: 1.79 mIU/L (ref 0.40–4.50)

## 2021-01-30 LAB — MAGNESIUM: Magnesium: 2 mg/dL (ref 1.5–2.5)

## 2021-02-05 ENCOUNTER — Telehealth: Payer: Self-pay

## 2021-02-05 NOTE — Telephone Encounter (Signed)
Having awful headaches, and wanting something stronger than Excedrin but not a narcotic. Please advise.

## 2021-02-10 NOTE — Telephone Encounter (Signed)
Patient is schedule/d to be seen

## 2021-02-18 ENCOUNTER — Ambulatory Visit: Payer: PPO | Admitting: Nurse Practitioner

## 2021-03-04 ENCOUNTER — Ambulatory Visit: Payer: PPO | Admitting: Allergy & Immunology

## 2021-03-04 ENCOUNTER — Other Ambulatory Visit: Payer: Self-pay

## 2021-03-04 VITALS — BP 116/84 | HR 91 | Temp 97.9°F | Resp 18 | Ht 64.0 in | Wt 200.6 lb

## 2021-03-04 DIAGNOSIS — J3089 Other allergic rhinitis: Secondary | ICD-10-CM | POA: Diagnosis not present

## 2021-03-04 DIAGNOSIS — G500 Trigeminal neuralgia: Secondary | ICD-10-CM | POA: Diagnosis not present

## 2021-03-04 DIAGNOSIS — M950 Acquired deformity of nose: Secondary | ICD-10-CM

## 2021-03-04 DIAGNOSIS — J302 Other seasonal allergic rhinitis: Secondary | ICD-10-CM | POA: Diagnosis not present

## 2021-03-04 NOTE — Progress Notes (Addendum)
NEW PATIENT  Date of Service/Encounter:  03/04/21  Consult requested by: Unk Pinto, MD   Assessment:   Seasonal and perennial allergic rhinitis (grasses, ragweed, trees, indoor molds, outdoor molds, cat, and dog)  Trigeminal neuralgia - with resulting nasal deformity, therefore unable to do nasal sprays  Multiple ophthalmic issues including a macular hole as well as retinal detachments  Plan/Recommendations:    1. Seasonal and perennial allergic rhinitis - Testing today showed: grasses, ragweed, trees, indoor molds, outdoor molds, cat, and dog - Copy of test results provided.  - Avoidance measures provided. - Stop taking: all of your current allergy medications - Start taking: Singulair (montelukast) 10mg  daily and carbinoxamine 4mg  up to three times daily (can cause sleepiness). - Singulair can causes irritability and bad dreams, so beware of that.  - You can use an extra dose of the antihistamine, if needed, for breakthrough symptoms.  - Consider nasal saline rinses 1-2 times daily to remove allergens from the nasal cavities as well as help with mucous clearance (this is especially helpful to do before the nasal sprays are given) - Consider allergy shots as a means of long-term control. - Allergy shots "re-train" and "reset" the immune system to ignore environmental allergens and decrease the resulting immune response to those allergens (sneezing, itchy watery eyes, runny nose, nasal congestion, etc).    - Allergy shots improve symptoms in 75-85% of patients.  - We can discuss more at the next appointment if the medications are not working for you.  2. Follow up in 4-6 weeks in [place].     This note in its entirety was forwarded to the Provider who requested this consultation.  Subjective:   Margaret Gilmore is a 65 y.o. female presenting today for evaluation of  Chief Complaint  Patient presents with   Allergic Rhinitis     Took a variety of antihistamines  and didn't find any relief. Sneezing several times in a row. Right side of the face is numb due to a surgery. Strong scents cause headaches. Ws tested several years ago. PCP wants to ensure that it's allergies and nothing neurological.     Margaret Gilmore has a history of the following: Patient Active Problem List   Diagnosis Date Noted   Hyperlipidemia 01/30/2021   Nasal deformity, acquired 07/10/2020   Chronic rhinitis 05/08/2020   Right bundle branch block (RBBB) 12/16/2017   Obesity (BMI 30.0-34.9) 08/19/2017   Macular hole, right eye 06/22/2017   Macular hole, left eye 02/11/2015   Other abnormal glucose (hx of prediabetes) 01/23/2014   Vitamin D deficiency    Anxiety    Depression, major, recurrent ([place])     History obtained from: chart review and patient.  Margaret Gilmore was referred by Unk Pinto, MD.     Margaret Gilmore is a 65 y.o. female presenting for an evaluation of environmental allergies .  Allergic Rhinitis Symptom History: She has had allergies for years. She was on Allegra and then changed to Zyrtec.  She does use fluticasone until she saw something about it interrupting trigeminal neuralgia.  She also reports that she gets headaches from processed meats as well as scents and other smells. She has a good sense of her triggers and she has dealt with it for years.   She has had issues with her trigeminal neuralgia. She has had surgery to disconnect the nerve. She was in her 70s. She is now going to a therapist because she is re-training her brain to  ignore the crawling sensation on the right side of the face. She has to have one year of this therapy until she has the reconstruction surgery. She was initially treated with shots of steroids into her nasal cavity.   She has eye puffiness when she gets up in the mornings. She reports that they are sticky and irritating. She does use some eye drops but she is very careful about what she uses.   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 She retired from there and went to [employer] in [place] for one year. Her eye issues have limited how well she can work. She has two children - both are in [place] (daughter is a house wife and a Geophysicist/field seismologist and her son does odd jobs for the state).  Otherwise, there is no history of other atopic diseases, including asthma, food allergies, drug allergies, stinging insect allergies, eczema, urticaria, or contact dermatitis. There is no significant infectious history. Vaccinations are up to date.    Past Medical History: Patient Active Problem List   Diagnosis Date Noted   Hyperlipidemia 01/30/2021   Nasal deformity, acquired 07/10/2020   Chronic rhinitis 05/08/2020   Right bundle branch block (RBBB) 12/16/2017   Obesity (BMI 30.0-34.9) 08/19/2017   Macular hole, right eye 06/22/2017   Macular hole, left eye 02/11/2015   Other abnormal glucose (hx of prediabetes) 01/23/2014   Vitamin D deficiency    Anxiety    Depression, major, recurrent (Margaret Gilmore)     Medication List:  Allergies as of 03/04/2021       Reactions   Pregabalin Swelling   Diphenhydramine Nausea And Vomiting   Severe headaches  Severe headaches  Severe headaches    Nitrous Oxide Other (See Comments)   Pt has gas bubble in right eye- will cause blindness per pt  Pt has gas bubble in right eye- will cause blindness per pt  Pt has gas bubble in right eye- will cause blindness per pt    Sulfa Antibiotics Other (See Comments)   Headache        Medication List        Accurate as of March 04, 2021 11:59 PM. If you have any questions, ask your nurse or doctor.          STOP taking these medications    ALLERGY 24-HR PO Stopped by: Valentina Shaggy, MD   escitalopram 10 MG tablet Commonly known as: Lexapro Stopped by: Valentina Shaggy, MD   Flonase 50 MCG/ACT nasal spray Generic drug: fluticasone Stopped by: Valentina Shaggy, MD   Magnesium Gluconate 550 MG  Tabs Stopped by: Valentina Shaggy, MD       TAKE these medications    buPROPion 75 MG tablet Commonly known as: WELLBUTRIN Take 75 mg by mouth every morning.   Carbinoxamine Maleate 4 MG Tabs Take 1 tablet (4 mg total) by mouth 3 (three) times daily. Started by: Valentina Shaggy, MD   Excedrin Extra Strength [phone] MG tablet Generic drug: aspirin-acetaminophen-caffeine Take 1 tablet by mouth every 6 (six) hours as needed for headache.   Fish Oil 1200 MG Caps   Magnesium 200 MG Chew Chew by mouth.   MIRALAX PO Take by mouth.   montelukast 10 MG tablet Commonly known as: Singulair Take 1 tablet (10 mg total) by mouth at bedtime. Started by: Valentina Shaggy, MD   multivitamin with minerals tablet Take 1 tablet by mouth daily.   NEXIUM  PO Take by mouth daily.   vitamin B-12 500 MCG tablet Commonly known as: CYANOCOBALAMIN Take 500 mcg by mouth daily.   VITAMIN C PO Take by mouth daily.   VITAMIN D PO Take 2,000 Units by mouth daily.   Vitamin D3 100000 UNIT/GM Powd Take by mouth.        Birth History: non-contributory  Developmental History: non-contributory  Past Surgical History: Past Surgical History:  Procedure Laterality Date   25 GAUGE PARS PLANA VITRECTOMY WITH 20 GAUGE MVR PORT Left 11/09/2017   25 GAUGE PARS PLANA VITRECTOMY WITH 20 GAUGE MVR PORT Left 11/09/2017   Procedure: 25 GAUGE PARS PLANA VITRECTOMY WITH 20 GAUGE MVR PORT;  Surgeon: [name], MD;  Location: [place];  Service: Ophthalmology;  Laterality: Left;   25 GAUGE PARS PLANA VITRECTOMY WITH 20 GAUGE MVR PORT FOR MACULAR HOLE Left 03/05/2015   Procedure: 25 GAUGE PARS PLANA VITRECTOMY WITH 20 GAUGE MVR PORT FOR MACULAR HOLE;  Surgeon: [name], MD;  Location: [facility];  Service: Ophthalmology;  Laterality: Left;   25 GAUGE PARS PLANA VITRECTOMY WITH 20 GAUGE MVR PORT FOR MACULAR HOLE Right 06/22/2017   25 GAUGE PARS PLANA VITRECTOMY WITH 20 GAUGE MVR PORT  FOR MACULAR HOLE Right 06/22/2017   Procedure: 25 GAUGE PARS PLANA VITRECTOMY WITH 20 GAUGE MVR PORT FOR MACULAR HOLE;  Surgeon: [name], MD;  Location: [facility];  Service: Ophthalmology;  Laterality: Right;   ABDOMINAL HYSTERECTOMY     Total   APPENDECTOMY     CESAREAN SECTION     COLONOSCOPY     ESOPHAGOGASTRODUODENOSCOPY (EGD) WITH ESOPHAGEAL DILATION     EYE SURGERY     GAS/FLUID EXCHANGE Left 03/05/2015   Procedure: GAS/FLUID EXCHANGE;  Surgeon: [name], MD;  Location: [facility];  Service: Ophthalmology;  Laterality: Left;  CF38   GAS/FLUID EXCHANGE Right 06/22/2017   Procedure: GAS/FLUID EXCHANGE;  Surgeon: [name], MD;  Location: [place] [place];  Service: Ophthalmology;  Laterality: Right;   GAS/FLUID EXCHANGE Right 07/27/2017   Procedure: GAS/FLUID EXCHANGE;  Surgeon: [name], MD;  Location: [place];  Service: Ophthalmology;  Laterality: Right;   GAS/FLUID EXCHANGE Left 11/09/2017   Procedure: GAS/FLUID EXCHANGE;  Surgeon: [name], MD;  Location: [facility];  Service: Ophthalmology;  Laterality: Left;   knot removed from right side of abdomen     LASER PHOTO ABLATION Left 03/05/2015   Procedure: LASER PHOTO ABLATION;  Surgeon: [name], MD;  Location: [facility];  Service: Ophthalmology;  Laterality: Left;  Headscope Laser   LASER PHOTO ABLATION Right 06/22/2017   Procedure: LASER PHOTO ABLATION;  Surgeon: [name], MD;  Location: [facility];  Service: Ophthalmology;  Laterality: Right;   LASER PHOTO ABLATION Right 07/27/2017   Procedure: LASER PHOTO ABLATION;  Surgeon: [name], MD;  Location: [place];  Service: Ophthalmology;  Laterality: Right;   LASER PHOTO ABLATION Left 11/09/2017   Procedure: LASER PHOTO ABLATION;  Surgeon: [name], MD;  Location: [place];  Service: Ophthalmology;  Laterality: Left;   MEMBRANE PEEL Left 03/05/2015   Procedure: MEMBRANE PEEL;  Surgeon: [name], MD;  Location: [facility];  Service: Ophthalmology;  Laterality:  Left;   MEMBRANE PEEL Right 06/22/2017   Procedure: MEMBRANE PEEL;  Surgeon: [name], MD;  Location: [facility];  Service: Ophthalmology;  Laterality: Right;   MEMBRANE PEEL Right 07/27/2017   Procedure: MEMBRANE PEEL;  Surgeon: Tempie Hoist  D, MD;  Location: [facility];  Service: Ophthalmology;  Laterality: Right;   MEMBRANE PEEL Left 11/09/2017   Procedure: MEMBRANE PEEL;  Surgeon: [name], MD;  Location: [facility];  Service: Ophthalmology;  Laterality: Left;   SCLERAL BUCKLE WITH POSSIBLE 25 GAUGE PARS PLANA VITRECTOMY Right 07/27/2017   Procedure: SCLERAL BUCKLE  25 GAUGE PARS PLANA VITRECTOMY, C3F8 INJECTION REPAIR OF COMPLEX TRACTION OF RETINAL DETACHMENT;  Surgeon: [name], MD;  Location: [facility];  Service: Ophthalmology;  Laterality: Right;   SERUM PATCH Left 03/05/2015   Procedure: SERUM PATCH;  Surgeon: [name], MD;  Location: [place];  Service: Ophthalmology;  Laterality: Left;   SERUM PATCH Right 06/22/2017   Procedure: SERUM PATCH;  Surgeon: [name], MD;  Location: [facility];  Service: Ophthalmology;  Laterality: Right;   SERUM PATCH Left 11/09/2017   Procedure: SERUM PATCH;  Surgeon: [name], MD;  Location: [facility];  Service: Ophthalmology;  Laterality: Left;   trigeminal        Family History: Family History  Problem Relation Age of Onset   Diabetes Mother    COPD Mother    Hyperlipidemia Mother    Pancreatic cancer Father 74   Hyperlipidemia Father    Heart disease Father    Hypertension Father    Hypertension Brother    Alzheimer's disease Maternal Grandmother    Bone cancer Maternal Grandfather        metz from prostate   Prostate cancer Maternal Grandfather    Breast cancer Maternal Aunt        BREAST   Alzheimer's disease Maternal Aunt    Hodgkin's lymphoma Paternal Aunt        HODGKINS   Colon cancer Paternal Uncle    Cancer Paternal Uncle        4 different kinds, can't recall what     Social History: Margaret Gilmore lives at home with  her family.  She lives in an apartment and is around 65 years old.  There is carpeting throughout the apartment.  She has a heat pump for heating and cooling.  There are no animals inside or outside of the home.  There are no dust mite covers on the bedding.  There is no tobacco exposure.  She is retired.  She is not exposed to fumes, chemicals, or dust.  She does not live near an interstate or industrial area.   Review of Systems  Constitutional: Negative.  Negative for fever, malaise/fatigue and weight loss.  HENT:  Positive for congestion and sinus pain. Negative for ear discharge and ear pain.        Positive for postnasal drip.  Eyes:  Negative for pain, discharge and redness.  Respiratory:  Negative for cough, sputum production, shortness of breath and wheezing.   Cardiovascular: Negative.  Negative for chest pain and palpitations.  Gastrointestinal:  Positive for heartburn. Negative for abdominal pain, constipation, diarrhea, nausea and vomiting.  Skin: Negative.  Negative for itching and rash.  Neurological:  Negative for dizziness and headaches.  Endo/Heme/Allergies:  Negative for environmental allergies. Does not bruise/bleed easily.      Objective:   Blood pressure 116/84, pulse 91, temperature 97.9 F (36.6 C), temperature source Temporal, resp. rate 18, height 5\' 4"  (1.626 m), weight 200 lb 9.6 oz (91 kg), SpO2 94 %. Body mass index is 34.43 kg/m.   Physical Exam:   Physical Exam Constitutional:      Appearance: She is well-developed.  HENT:  Head: Normocephalic and atraumatic.     Right Ear: Tympanic membrane, ear canal and external ear normal. No drainage, swelling or tenderness. Tympanic membrane is not injected, scarred, erythematous, retracted or bulging.     Left Ear: Tympanic membrane, ear canal and external ear normal. No drainage, swelling or tenderness. Tympanic membrane is not injected, scarred, erythematous, retracted or bulging.     Nose: Nasal  deformity present. No septal deviation, mucosal edema or rhinorrhea.     Right Turbinates: Enlarged, swollen and pale.     Left Turbinates: Enlarged, swollen and pale.     Right Sinus: No maxillary sinus tenderness or frontal sinus tenderness.     Left Sinus: No maxillary sinus tenderness or frontal sinus tenderness.     Comments: The right side of the nose and some nasal cartilage degeneration with resulting separation from the face.    Mouth/Throat:     Mouth: Mucous membranes are not pale and not dry.     Pharynx: Uvula midline.  Eyes:     General:        Right eye: No discharge.        Left eye: No discharge.     Conjunctiva/sclera: Conjunctivae normal.     Right eye: Right conjunctiva is not injected. No chemosis.    Left eye: Left conjunctiva is not injected. No chemosis.    Pupils: Pupils are equal, round, and reactive to light.  Cardiovascular:     Rate and Rhythm: Normal rate and regular rhythm.     Heart sounds: Normal heart sounds.  Pulmonary:     Effort: Pulmonary effort is normal. No tachypnea, accessory muscle usage or respiratory distress.     Breath sounds: Normal breath sounds. No wheezing, rhonchi or rales.     Comments: Moving air well in all lung fields.  Chest:     Chest wall: No tenderness.  Abdominal:     Tenderness: There is no abdominal tenderness. There is no guarding or rebound.  Lymphadenopathy:     Head:     Right side of head: No submandibular, tonsillar or occipital adenopathy.     Left side of head: No submandibular, tonsillar or occipital adenopathy.     Cervical: No cervical adenopathy.  Skin:    Coloration: Skin is not pale.     Findings: No abrasion, erythema, petechiae or rash. Rash is not papular, urticarial or vesicular.  Neurological:     Mental Status: She is alert.     Diagnostic studies:    Allergy Studies:     Airborne Adult Perc - 03/04/21 1437     Time Antigen Placed 1415    Allergen Manufacturer Lavella Hammock    Location Back     Number of Test 59    1. Control-Buffer 50% Glycerol Negative    2. Control-Histamine 1 mg/ml 2+    3. Albumin saline Negative    4. [place] Negative    5. [place] Negative    6. Schorsch Negative    7. [place] Blue Negative    8. Meadow Fescue Negative    9. Perennial Rye Negative    10. Sweet Vernal Negative    11. Timothy Negative    12. Cocklebur Negative    13. Burweed Marshelder Negative    14. Ragweed, short Negative    15. Ragweed, Giant Negative    16. Plantain,  English Negative    17. Lamb's Quarters Negative    18. Sheep Sorrell Negative    19. Rough  Pigweed Negative    20. Marsh Elder, Rough Negative    21. Mugwort, Common Negative    22. Ash mix Negative    23. Birch mix Negative    24. Beech American Negative    25. Box, Elder Negative    26. Cedar, red Negative    27. Cottonwood, [place] Negative    28. Elm mix Negative    29. Hickory Negative    30. Maple mix Negative    31. Oak, [place] mix Negative    32. Pecan Pollen Negative    33. Pine mix Negative    34. Sycamore Eastern Negative    35. [place], Black Pollen Negative    36. Alternaria alternata Negative    37. Cladosporium Herbarum Negative    38. Aspergillus mix Negative    39. Penicillium mix Negative    40. Bipolaris sorokiniana (Helminthosporium) Negative    41. Drechslera spicifera (Curvularia) Negative    42. Mucor plumbeus Negative    43. Fusarium moniliforme Negative    44. Aureobasidium pullulans (pullulara) Negative    45. Rhizopus oryzae Negative    46. Botrytis cinera Negative    47. Epicoccum nigrum Negative    48. Phoma betae Negative    49. Candida Albicans Negative    50. Trichophyton mentagrophytes Negative    51. Mite, D Farinae  5,000 AU/ml Negative    52. Mite, D Pteronyssinus  5,000 AU/ml Negative    53. Cat Hair 10,000 BAU/ml Negative    54.  Dog Epithelia Negative    55. Mixed Feathers Negative    56. Horse Epithelia Negative    57. Cockroach, German Negative    58.  Mouse Negative    59. Tobacco Leaf Negative             Intradermal - 03/04/21 1437     Time Antigen Placed 1445    Allergen Manufacturer Lavella Hammock    Location Arm    Number of Test 15    Intradermal Select    Control Negative    Guatem[place] 1+    Sum[place]y 1+    7 Grass Negative    Ragweed mix 1+    Weed mix Negative    Tree mix 1+    Mold 1 Negative    Mold 2 Negative    Mold 3 3+    Mold 4 3+    Cat 3+    Dog 3+    Cockroach Negative    Mite mix Negative             Allergy testing results were read and interpreted by myself, documented by clinical staff.         Salvatore Marvel, MD [facility]

## 2021-03-04 NOTE — Patient Instructions (Signed)
1. Seasonal and perennial allergic rhinitis - Testing today showed: grasses, ragweed, trees, indoor molds, outdoor molds, cat, and dog - Copy of test results provided.  - Avoidance measures provided. - Stop taking: all of your current allergy medications - Start taking: Singulair (montelukast) 10mg  daily and carbinoxamine 4mg  up to three times daily (can cause sleepiness). - Singulair can causes irritability and bad dreams, so beware of that.  - You can use an extra dose of the antihistamine, if needed, for breakthrough symptoms.  - Consider nasal saline rinses 1-2 times daily to remove allergens from the nasal cavities as well as help with mucous clearance (this is especially helpful to do before the nasal sprays are given) - Consider allergy shots as a means of long-term control. - Allergy shots "re-train" and "reset" the immune system to ignore environmental allergens and decrease the resulting immune response to those allergens (sneezing, itchy watery eyes, runny nose, nasal congestion, etc).    - Allergy shots improve symptoms in 75-85% of patients.  - We can discuss more at the next appointment if the medications are not working for you.  2. Follow up in 4-6 weeks in [place].    Please inform us of any Emergency Department visits, hospitalizations, or changes in symptoms. Call us before going to the ED for breathing or allergy symptoms since we might be able to fit you in for a sick visit. Feel free to contact us anytime with any questions, problems, or concerns.  It was a pleasure to meet you today!  Websites that have reliable patient information: 1. American Academy of Asthma, Allergy, and Immunology: www.aaaai.org 2. Food Allergy Research and Education (FARE): foodallergy.org 3. Mothers of Asthmatics: http://www.asthmacommunitynetwork.org 4. American College of Allergy, Asthma, and Immunology: www.acaai.org   COVID-19 Vaccine Information can be found at:  [email or web address] For questions related to vaccine distribution or appointments, please email [email or web address] or call [phone].   We realize that you might be concerned about having an allergic reaction to the COVID19 vaccines. To help with that concern, WE ARE OFFERING THE COVID19 VACCINES IN OUR OFFICE! Ask the front desk for dates!     "Like" [place] on Facebook and Instagram for our latest updates!      A healthy democracy works best when [employer] participate! Make sure you are registered to vote! If you have moved or changed any of your contact information, you will need to get this updated before voting!  In some cases, you MAY be able to register to vote online: [email or web address]    Reducing Pollen Exposure  The American Academy of Allergy, Asthma and Immunology suggests the following steps to reduce your exposure to pollen during allergy seasons.    Do not hang sheets or clothing out to dry; pollen may collect on these items. Do not mow lawns or spend time around freshly cut grass; mowing stirs up pollen. Keep windows closed at night.  Keep car windows closed while driving. Minimize morning activities outdoors, a time when pollen counts are usually at their highest. Stay indoors as much as possible when pollen counts or humidity is high and on windy days when pollen tends to remain in the air longer. Use air conditioning when possible.  Many air conditioners have filters that trap the pollen spores. Use a HEPA room air filter to remove pollen form the indoor air you breathe.  Control of Mold Allergen   Mold and fungi can grow on a variety of surfaces provided certain temperature and moisture conditions  exist.  Outdoor molds grow on plants, decaying vegetation and soil.  The major outdoor mold, Alternaria and Cladosporium, are found in very high numbers during hot and dry  conditions.  Generally, a late Summer - Fall peak is seen for common outdoor fungal spores.  Rain will temporarily lower outdoor mold spore count, but counts rise rapidly when the rainy period ends.  The most important indoor molds are Aspergillus and Penicillium.  Dark, humid and poorly ventilated basements are ideal sites for mold growth.  The next most common sites of mold growth are the bathroom and the kitchen.  Outdoor (Seasonal) Mold Control  Positive outdoor molds via skin testing: Bipolaris (Helminthsporium), Drechslera (Curvalaria), and Mucor  Use air conditioning and keep windows closed Avoid exposure to decaying vegetation. Avoid leaf raking. Avoid grain handling. Consider wearing a face mask if working in moldy areas.    Indoor (Perennial) Mold Control   Positive indoor molds via skin testing: Fusarium, Aureobasidium (Pullulara), and Rhizopus  Maintain humidity below 50%. Clean washable surfaces with 5% bleach solution. Remove sources e.g. contaminated carpets.    Control of Dog or Cat Allergen  Avoidance is the best way to manage a dog or cat allergy. If you have a dog or cat and are allergic to dog or cats, consider removing the dog or cat from the home. If you have a dog or cat but don't want to find it a new home, or if your family wants a pet even though someone in the household is allergic, here are some strategies that may help keep symptoms at bay:  Keep the pet out of your bedroom and restrict it to only a few rooms. Be advised that keeping the dog or cat in only one room will not limit the allergens to that room. Don't pet, hug or kiss the dog or cat; if you do, wash your hands with soap and water. High-efficiency particulate air (HEPA) cleaners run continuously in a bedroom or living room can reduce allergen levels over time. Regular use of a high-efficiency vacuum cleaner or a central vacuum can reduce allergen levels. Giving your dog or cat a bath at least  once a week can reduce airborne allergen.  Allergy Shots   Allergies are the result of a chain reaction that starts in the immune system. Your immune system controls how your body defends itself. For instance, if you have an allergy to pollen, your immune system identifies pollen as an invader or allergen. Your immune system overreacts by producing antibodies called Immunoglobulin E (IgE). These antibodies travel to cells that release chemicals, causing an allergic reaction.  The concept behind allergy immunotherapy, whether it is received in the form of shots or tablets, is that the immune system can be desensitized to specific allergens that trigger allergy symptoms. Although it requires time and patience, the payback can be long-term relief.  How Do Allergy Shots Work?  Allergy shots work much like a vaccine. Your body responds to injected amounts of a particular allergen given in increasing doses, eventually developing a resistance and tolerance to it. Allergy shots can lead to decreased, minimal or no allergy symptoms.  There generally are two phases: build-up and maintenance. Build-up often ranges from three to six months and involves receiving injections with increasing amounts of the allergens. The shots are typically given once or twice a week, though more rapid build-up schedules are sometimes used.  The maintenance phase begins when the most effective dose is reached. This dose is  different for each person, depending on how allergic you are and your response to the build-up injections. Once the maintenance dose is reached, there are longer periods between injections, typically two to four weeks.  Occasionally doctors give cortisone-type shots that can temporarily reduce allergy symptoms. These types of shots are different and should not be confused with allergy immunotherapy shots.  Who Can Be Treated with Allergy Shots?  Allergy shots may be a good treatment approach for people with  allergic rhinitis (hay fever), allergic asthma, conjunctivitis (eye allergy) or stinging insect allergy.   Before deciding to begin allergy shots, you should consider:   The length of allergy season and the severity of your symptoms  Whether medications and/or changes to your environment can control your symptoms  Your desire to avoid long-term medication use  Time: allergy immunotherapy requires a major time commitment  Cost: may vary depending on your insurance coverage  Allergy shots for children age 79 and older are effective and often well tolerated. They might prevent the onset of new allergen sensitivities or the progression to asthma.  Allergy shots are not started on patients who are pregnant but can be continued on patients who become pregnant while receiving them. In some patients with other medical conditions or who take certain common medications, allergy shots may be of risk. It is important to mention other medications you talk to your allergist.   When Will I Feel Better?  Some may experience decreased allergy symptoms during the build-up phase. For others, it may take as long as 12 months on the maintenance dose. If there is no improvement after a year of maintenance, your allergist will discuss other treatment options with you.  If you aren't responding to allergy shots, it may be because there is not enough dose of the allergen in your vaccine or there are missing allergens that were not identified during your allergy testing. Other reasons could be that there are high levels of the allergen in your environment or major exposure to non-allergic triggers like tobacco smoke.  What Is the Length of Treatment?  Once the maintenance dose is reached, allergy shots are generally continued for three to five years. The decision to stop should be discussed with your allergist at that time. Some people may experience a permanent reduction of allergy symptoms. Others may relapse and a  longer course of allergy shots can be considered.  What Are the Possible Reactions?  The two types of adverse reactions that can occur with allergy shots are local and systemic. Common local reactions include very mild redness and swelling at the injection site, which can happen immediately or several hours after. A systemic reaction, which is less common, affects the entire body or a particular body system. They are usually mild and typically respond quickly to medications. Signs include increased allergy symptoms such as sneezing, a stuffy nose or hives.  Rarely, a serious systemic reaction called anaphylaxis can develop. Symptoms include swelling in the throat, wheezing, a feeling of tightness in the chest, nausea or dizziness. Most serious systemic reactions develop within 30 minutes of allergy shots. This is why it is strongly recommended you wait in your doctor's office for 30 minutes after your injections. Your allergist is trained to watch for reactions, and his or her staff is trained and equipped with the proper medications to identify and treat them.  Who Should Administer Allergy Shots?  The preferred location for receiving shots is your prescribing allergist's office. Injections can sometimes be  given at another facility where the physician and staff are trained to recognize and treat reactions, and have received instructions by your prescribing allergist.

## 2021-03-05 ENCOUNTER — Telehealth: Payer: Self-pay | Admitting: Allergy & Immunology

## 2021-03-05 MED ORDER — MONTELUKAST SODIUM 10 MG PO TABS: 10.0000 mg | ORAL_TABLET | Freq: Every day | ORAL | 5 refills | Status: DC

## 2021-03-05 MED ORDER — CARBINOXAMINE MALEATE ER 4 MG/5ML PO SUER: 4.0000 mL | Freq: Four times a day (QID) | ORAL | 0 refills | Status: DC | PRN

## 2021-03-05 NOTE — Telephone Encounter (Signed)
Patient called and said that she needed the singulair 10mg . And Carbinoxamine 4 mg. Called to [facility] in [place] [phone].

## 2021-03-05 NOTE — Telephone Encounter (Signed)
Patients medications didn't get sent to the pharmacy  Sending in Montelukast and Carbinoxamine to [facility] in [place]

## 2021-03-06 ENCOUNTER — Encounter: Payer: Self-pay | Admitting: Allergy & Immunology

## 2021-03-06 MED ORDER — MONTELUKAST SODIUM 10 MG PO TABS: 10.0000 mg | ORAL_TABLET | Freq: Every day | ORAL | 5 refills | Status: DC

## 2021-03-06 MED ORDER — CARBINOXAMINE MALEATE 4 MG PO TABS: 4.0000 mg | ORAL_TABLET | Freq: Three times a day (TID) | ORAL | 2 refills | Status: DC

## 2021-03-06 NOTE — Telephone Encounter (Signed)
I called the patient and she plans to call her insurance tomorrow to see what is covered. I spoke with her pharmacy and Carbinoxamine is 68.99 out of pocket. They were not able to see if any other medication was covered to replace the carbinoxamine.

## 2021-03-06 NOTE — Telephone Encounter (Signed)
Patient called back stating the Carbinoxamine isn't covered by her insurance and will be to expensive out of pocket.   Please Advise.

## 2021-03-07 ENCOUNTER — Other Ambulatory Visit: Payer: Self-pay | Admitting: *Deleted

## 2021-03-07 MED ORDER — LEVOCETIRIZINE DIHYDROCHLORIDE 5 MG PO TABS: 5.0000 mg | ORAL_TABLET | Freq: Every evening | ORAL | 5 refills | Status: DC

## 2021-03-07 NOTE — Telephone Encounter (Signed)
Xyzal has been sent in to patients pharmacy and it is covered by her insurance. Called patient and advised. Patient verbalized understanding.

## 2021-03-07 NOTE — Telephone Encounter (Signed)
I do not want to use clemastine because it is a first generation antihistamine. I have never heard of funarate. We could try changing her to Xyzal 5 mg once a day as needed. It looks like from Dr. Gillermina Hu note that she has tried Zyrtec and Allegra.

## 2021-03-07 NOTE — Addendum Note (Signed)
Addended by: Jacqualin Combes on: 03/07/2021 10:44 AM   Modules accepted: Orders

## 2021-03-07 NOTE — Telephone Encounter (Signed)
Patient called her insurance company and these are the options they gave her. Clemastine (they did not give a mg for this one) or Funarate 2.5 mg.

## 2021-03-12 ENCOUNTER — Encounter (INDEPENDENT_AMBULATORY_CARE_PROVIDER_SITE_OTHER): Payer: PPO | Admitting: Ophthalmology

## 2021-03-12 ENCOUNTER — Other Ambulatory Visit: Payer: Self-pay

## 2021-03-12 DIAGNOSIS — H338 Other retinal detachments: Secondary | ICD-10-CM | POA: Diagnosis not present

## 2021-03-12 DIAGNOSIS — Q899 Congenital malformation, unspecified: Secondary | ICD-10-CM | POA: Diagnosis not present

## 2021-03-12 DIAGNOSIS — F331 Major depressive disorder, recurrent, moderate: Secondary | ICD-10-CM | POA: Diagnosis not present

## 2021-03-12 DIAGNOSIS — H35343 Macular cyst, hole, or pseudohole, bilateral: Secondary | ICD-10-CM | POA: Diagnosis not present

## 2021-03-12 DIAGNOSIS — F424 Excoriation (skin-picking) disorder: Secondary | ICD-10-CM | POA: Diagnosis not present

## 2021-03-12 DIAGNOSIS — F411 Generalized anxiety disorder: Secondary | ICD-10-CM | POA: Diagnosis not present

## 2021-03-26 ENCOUNTER — Telehealth: Payer: Self-pay | Admitting: Allergy & Immunology

## 2021-03-26 NOTE — Telephone Encounter (Signed)
Please provide recommendation.

## 2021-03-26 NOTE — Telephone Encounter (Signed)
Patient saw Dr. Ernst Bowler on 03/04/21. She said when she came in, she was having headaches and thought it might be due to allergies. She said she was prescribed Montelukast and Levocetirizine. She said her headaches are worse now and would like to know if they could be worse due to these two new medications she is on. If so, she wants to stop them.

## 2021-03-27 NOTE — Telephone Encounter (Signed)
Lm for pt to call us back about this °

## 2021-03-27 NOTE — Telephone Encounter (Signed)
Sure that might be the case - I would stop them for sure and see if the headaches improve. Maybe stay off of all medications for a week before adding anything else back into the mix.   I would consider adding Claritin or Allegra instead.   Salvatore Marvel, MD [facility] [place]

## 2021-03-31 NOTE — Telephone Encounter (Signed)
Spoke with patient, informed her of Dr. Gillermina Hu recommendation. Patient verbalized understanding and will call with an update.

## 2021-04-01 NOTE — Patient Instructions (Addendum)
1. Seasonal and perennial allergic rhinitis (skin test positive [id number], ragweed, trees, indoor molds, outdoor molds, cat, and dog) - Start taking: Singulair (montelukast) 10mg  daily and if this does not make your headaches worse try adding levocetirizine 5 mg once a day  and see if your headaches worsen. If these do make your headaches worse you can try Allegra or Claritin. ( Carbinoxamine was too expensive$68.99) - Singulair can causes irritability and bad dreams, so beware of that. Let us know if that occurs. - You can use an extra dose of the antihistamine, if needed, for breakthrough symptoms.  - Consider nasal saline rinses 1-2 times daily to remove allergens from the nasal cavities as well as help with mucous clearance (this is especially helpful to do before the nasal sprays are given) - Consider allergy shots as a means of long-term control. - Allergy shots "re-train" and "reset" the immune system to ignore environmental allergens and decrease the resulting immune response to those allergens (sneezing, itchy watery eyes, runny nose, nasal congestion, etc).    - Allergy shots improve symptoms in 75-85% of patients.  - We can discuss more at the next appointment if the medications are not working for you. -CPT code given for her to call her insurance to find out cost. Call our office after you call your insurance if you are interested in starting allergy injections  2.  Shortness of breath/cough Poor effort noted on spirometry. We will send in a prescription for albuterol using 2 puffs every 4-6 hours as needed for cough, wheeze, tightness in chest, or shortness of breath to see if this helps with your shortness of breath and cough.   Follow up in 3 months or sooner if needed    Reducing Pollen Exposure  The American Academy of Allergy, Asthma and Immunology suggests the following steps to reduce your exposure to pollen during allergy seasons.    Do not hang sheets or clothing out to  dry; pollen may collect on these items. Do not mow lawns or spend time around freshly cut grass; mowing stirs up pollen. Keep windows closed at night.  Keep car windows closed while driving. Minimize morning activities outdoors, a time when pollen counts are usually at their highest. Stay indoors as much as possible when pollen counts or humidity is high and on windy days when pollen tends to remain in the air longer. Use air conditioning when possible.  Many air conditioners have filters that trap the pollen spores. Use a HEPA room air filter to remove pollen form the indoor air you breathe.  Control of Mold Allergen   Mold and fungi can grow on a variety of surfaces provided certain temperature and moisture conditions exist.  Outdoor molds grow on plants, decaying vegetation and soil.  The major outdoor mold, Alternaria and Cladosporium, are found in very high numbers during hot and dry conditions.  Generally, a late Summer - Fall peak is seen for common outdoor fungal spores.  Rain will temporarily lower outdoor mold spore count, but counts rise rapidly when the rainy period ends.  The most important indoor molds are Aspergillus and Penicillium.  Dark, humid and poorly ventilated basements are ideal sites for mold growth.  The next most common sites of mold growth are the bathroom and the kitchen.  Outdoor (Seasonal) Mold Control  Positive outdoor molds via skin testing: Bipolaris (Helminthsporium), Drechslera (Curvalaria), and Mucor  Use air conditioning and keep windows closed Avoid exposure to decaying vegetation. Avoid leaf raking. Avoid  grain handling. Consider wearing a face mask if working in moldy areas.    Indoor (Perennial) Mold Control   Positive indoor molds via skin testing: Fusarium, Aureobasidium (Pullulara), and Rhizopus  Maintain humidity below 50%. Clean washable surfaces with 5% bleach solution. Remove sources e.g. contaminated carpets.    Control of Dog or Cat  Allergen  Avoidance is the best way to manage a dog or cat allergy. If you have a dog or cat and are allergic to dog or cats, consider removing the dog or cat from the home. If you have a dog or cat but don't want to find it a new home, or if your family wants a pet even though someone in the household is allergic, here are some strategies that may help keep symptoms at bay:  Keep the pet out of your bedroom and restrict it to only a few rooms. Be advised that keeping the dog or cat in only one room will not limit the allergens to that room. Don't pet, hug or kiss the dog or cat; if you do, wash your hands with soap and water. High-efficiency particulate air (HEPA) cleaners run continuously in a bedroom or living room can reduce allergen levels over time. Regular use of a high-efficiency vacuum cleaner or a central vacuum can reduce allergen levels. Giving your dog or cat a bath at least once a week can reduce airborne allergen.

## 2021-04-02 ENCOUNTER — Other Ambulatory Visit: Payer: Self-pay

## 2021-04-02 ENCOUNTER — Encounter: Payer: Self-pay | Admitting: Family

## 2021-04-02 ENCOUNTER — Ambulatory Visit: Payer: PPO | Admitting: Family

## 2021-04-02 ENCOUNTER — Telehealth: Payer: Self-pay

## 2021-04-02 VITALS — BP 120/82 | HR 92 | Temp 98.7°F | Resp 16 | Ht 65.0 in | Wt 200.8 lb

## 2021-04-02 DIAGNOSIS — R0600 Dyspnea, unspecified: Secondary | ICD-10-CM | POA: Diagnosis not present

## 2021-04-02 DIAGNOSIS — R059 Cough, unspecified: Secondary | ICD-10-CM | POA: Diagnosis not present

## 2021-04-02 DIAGNOSIS — G500 Trigeminal neuralgia: Secondary | ICD-10-CM

## 2021-04-02 DIAGNOSIS — J3089 Other allergic rhinitis: Secondary | ICD-10-CM

## 2021-04-02 DIAGNOSIS — M950 Acquired deformity of nose: Secondary | ICD-10-CM

## 2021-04-02 DIAGNOSIS — J302 Other seasonal allergic rhinitis: Secondary | ICD-10-CM | POA: Diagnosis not present

## 2021-04-02 MED ORDER — ALBUTEROL SULFATE HFA 108 (90 BASE) MCG/ACT IN AERS: 2.0000 | INHALATION_SPRAY | Freq: Four times a day (QID) | RESPIRATORY_TRACT | 1 refills | Status: DC | PRN

## 2021-04-02 NOTE — Telephone Encounter (Signed)
Patient called stating we will need to contact her insurance to see if they will approve her for allergy injections. Her insurance wouldn't give her any information but to tell our office to contact [facility].   Please Advise  [phone] (Option 1)

## 2021-04-02 NOTE — Progress Notes (Signed)
[address] [place] [id number] Dept: [phone]  FOLLOW UP NOTE  Patient ID: DESIRA ALESSANDRINI, female    DOB: August 23, 1955  Age: 65 y.o. MRN: [id number] Date of Office Visit: 04/02/2021  Assessment  Chief Complaint: Headache (Pateint in today due to change in medication. She suspects she is having them due to the new medications she started at last visit. Montelukast and Xyzal)  HPI ZACARIA POUSSON is a 65 year old female who presents today for follow-up of seasonal and perennial allergic rhinitis, trigeminal neuralgia with resulting nasal deformity, therefore unable to do nasal sprays, multiple ophthalmic issues including a macular hole as well as retinal detachments.  She was last seen on March 04, 2021 by Dr. Ernst Bowler.  Her last office visit she denies any new diagnosis or surgeries.  Seasonal and perennial allergic rhinitis is reported as not well controlled with saline spray as needed.  She felt that while she was taking montelukast and levocetirizine that her headaches were more frequent.  She reports that she has been susceptible to headaches since falling and having a concussion when she was younger.  She has been off montelukast and levocetirizine for a week and feels like her headaches are slight and more tolerable.  She is going to try taking montelukast on its own and see if this causes her headaches to worsen.  If she does well with this she will then try adding on levocetirizine.  She also mentions that she has had no energy since taking montelukast and levocetirizine.  It has been a struggle for her to exercise, but she is pushing through to get it done.  Since stopping the montelukast and the levocetirizine her energy is maybe a little bit better.  She was not able to get the carbinoxamine that was prescribed at the last office visit due to it costing her $68.99 out-of-pocket.  She mentions that she has tried allergy injections when her kids were younger and they  are now in their 33s.  She is interested in possibly starting allergy injections depending on what the cost is.  She reports clear rhinorrhea, nasal congestion when lying down, and constant postnasal drip.  She reports that she is in the process of working to have reconstructive surgery for the right side of her nose.  She is hopeful that this will help with her headaches also.  Reports that she has had shortness of breath and cough for a long time.  She has never been diagnosed with asthma or COPD.  She has never had any inhalers.  She denies wheezing and tightness in her chest.   Drug Allergies:  Allergies  Allergen Reactions   Pregabalin Swelling   Diphenhydramine Nausea And Vomiting    Severe headaches  Severe headaches  Severe headaches    Nitrous Oxide Other (See Comments)    Pt has gas bubble in right eye- will cause blindness per pt  Pt has gas bubble in right eye- will cause blindness per pt  Pt has gas bubble in right eye- will cause blindness per pt    Sulfa Antibiotics Other (See Comments)    Headache    Review of Systems: Review of Systems  Constitutional:  Negative for chills and fever.  HENT:         Reports clear rhinorrhea, nasal congestion when lying down, and a constant postnasal drip.  Eyes:        Reports itchy eyes.  She mentions that she has had 2 macular  hole surgeries on her left eye and one on her right eye.  She is needing a second 1 on her right eye, but is refusing at this time.  She also reports that she has had a retinal detachment on her right eye.  Respiratory:  Positive for cough and shortness of breath. Negative for wheezing.   Cardiovascular:  Negative for chest pain and palpitations.  Gastrointestinal:        Reports heartburn and reflux symptoms.  She is currently on Nexium 40 mg once a day for esophageal stricture and hiatal hernia.  Genitourinary:  Negative for frequency.  Skin:  Negative for itching and rash.  Neurological:  Positive for  headaches.       Reports that she is susceptible to headaches since falling and having a concussion when she was younger.  Endo/Heme/Allergies:  Positive for environmental allergies.    Physical Exam: BP 120/82   Pulse 92   Temp 98.7 F (37.1 C) (Temporal)   Resp 16   Ht 5\' 5"  (1.651 m)   Wt 200 lb 12.8 oz (91.1 kg)   SpO2 96%   BMI 33.41 kg/m    Physical Exam Constitutional:      Appearance: Normal appearance. She is well-developed.  HENT:     Head: Normocephalic and atraumatic.     Comments: Pharynx normal, eyes normal, ears normal.  Nose see below.  Also bilateral lower turbinates mildly edematous and slightly erythematous with no drainage noted    Right Ear: Tympanic membrane, ear canal and external ear normal.     Left Ear: Tympanic membrane, ear canal and external ear normal.     Nose:     Comments: Right-sided nose and some nasal cartilage degeneration with resulting separation from the face.    Mouth/Throat:     Mouth: Mucous membranes are moist.     Pharynx: Oropharynx is clear.  Eyes:     Conjunctiva/sclera: Conjunctivae normal.  Cardiovascular:     Rate and Rhythm: Regular rhythm.     Heart sounds: Normal heart sounds.  Pulmonary:     Effort: Pulmonary effort is normal.     Breath sounds: Normal breath sounds.     Comments: Lungs clear to auscultation Musculoskeletal:     Cervical back: Neck supple.  Skin:    General: Skin is warm.  Neurological:     Mental Status: She is alert and oriented to person, place, and time.  Psychiatric:        Mood and Affect: Mood normal.        Behavior: Behavior normal.        Thought Content: Thought content normal.        Judgment: Judgment normal.    Diagnostics: FVC 2.58 L, FEV1 1.87 L.  Predicted FVC 3.06 L, predicted FEV1 2.39 L.  Spirometry indicates normal respiratory function.  Poor effort noted on spirometry.  Assessment and Plan: 1. Seasonal and perennial allergic rhinitis   2. Nasal deformity, acquired    3. Trigeminal neuralgia   4. Dyspnea, unspecified type   5. Cough, unspecified type     Meds ordered this encounter  Medications   albuterol (VENTOLIN HFA) 108 (90 Base) MCG/ACT inhaler    Sig: Inhale 2 puffs into the lungs every 6 (six) hours as needed for wheezing or shortness of breath.    Dispense:  8 g    Refill:  1    Patient Instructions  1. Seasonal and perennial allergic rhinitis (skin test positive FT:DDUKGUR,  ragweed, trees, indoor molds, outdoor molds, cat, and dog) - Start taking: Singulair (montelukast) 10mg  daily and if this does not make your headaches worse try adding levocetirizine 5 mg once a day  and see if your headaches worsen. If these do make your headaches worse you can try Allegra or Claritin. ( Carbinoxamine was too expensive$68.99) - Singulair can causes irritability and bad dreams, so beware of that. Let us know if that occurs. - You can use an extra dose of the antihistamine, if needed, for breakthrough symptoms.  - Consider nasal saline rinses 1-2 times daily to remove allergens from the nasal cavities as well as help with mucous clearance (this is especially helpful to do before the nasal sprays are given) - Consider allergy shots as a means of long-term control. - Allergy shots "re-train" and "reset" the immune system to ignore environmental allergens and decrease the resulting immune response to those allergens (sneezing, itchy watery eyes, runny nose, nasal congestion, etc).    - Allergy shots improve symptoms in 75-85% of patients.  - We can discuss more at the next appointment if the medications are not working for you. -CPT code given for her to call her insurance to find out cost. Call our office after you call your insurance if you are interested in starting allergy injections  2.  Shortness of breath/cough Poor effort noted on spirometry. We will send in a prescription for albuterol using 2 puffs every 4-6 hours as needed for cough, wheeze,  tightness in chest, or shortness of breath to see if this helps with your shortness of breath and cough.   Follow up in 3 months or sooner if needed    Reducing Pollen Exposure  The American Academy of Allergy, Asthma and Immunology suggests the following steps to reduce your exposure to pollen during allergy seasons.    Do not hang sheets or clothing out to dry; pollen may collect on these items. Do not mow lawns or spend time around freshly cut grass; mowing stirs up pollen. Keep windows closed at night.  Keep car windows closed while driving. Minimize morning activities outdoors, a time when pollen counts are usually at their highest. Stay indoors as much as possible when pollen counts or humidity is high and on windy days when pollen tends to remain in the air longer. Use air conditioning when possible.  Many air conditioners have filters that trap the pollen spores. Use a HEPA room air filter to remove pollen form the indoor air you breathe.  Control of Mold Allergen   Mold and fungi can grow on a variety of surfaces provided certain temperature and moisture conditions exist.  Outdoor molds grow on plants, decaying vegetation and soil.  The major outdoor mold, Alternaria and Cladosporium, are found in very high numbers during hot and dry conditions.  Generally, a late Summer - Fall peak is seen for common outdoor fungal spores.  Rain will temporarily lower outdoor mold spore count, but counts rise rapidly when the rainy period ends.  The most important indoor molds are Aspergillus and Penicillium.  Dark, humid and poorly ventilated basements are ideal sites for mold growth.  The next most common sites of mold growth are the bathroom and the kitchen.  Outdoor (Seasonal) Mold Control  Positive outdoor molds via skin testing: Bipolaris (Helminthsporium), Drechslera (Curvalaria), and Mucor  Use air conditioning and keep windows closed Avoid exposure to decaying vegetation. Avoid leaf  raking. Avoid grain handling. Consider wearing a face mask if working  in moldy areas.    Indoor (Perennial) Mold Control   Positive indoor molds via skin testing: Fusarium, Aureobasidium (Pullulara), and Rhizopus  Maintain humidity below 50%. Clean washable surfaces with 5% bleach solution. Remove sources e.g. contaminated carpets.    Control of Dog or Cat Allergen  Avoidance is the best way to manage a dog or cat allergy. If you have a dog or cat and are allergic to dog or cats, consider removing the dog or cat from the home. If you have a dog or cat but don't want to find it a new home, or if your family wants a pet even though someone in the household is allergic, here are some strategies that may help keep symptoms at bay:  Keep the pet out of your bedroom and restrict it to only a few rooms. Be advised that keeping the dog or cat in only one room will not limit the allergens to that room. Don't pet, hug or kiss the dog or cat; if you do, wash your hands with soap and water. High-efficiency particulate air (HEPA) cleaners run continuously in a bedroom or living room can reduce allergen levels over time. Regular use of a high-efficiency vacuum cleaner or a central vacuum can reduce allergen levels. Giving your dog or cat a bath at least once a week can reduce airborne allergen.       Return in about 3 months (around 07/03/2021), or if symptoms worsen or fail to improve.    Thank you for the opportunity to care for this patient.  Please do not hesitate to contact me with questions.  [name], FNP [facility]

## 2021-04-14 ENCOUNTER — Telehealth: Payer: Self-pay

## 2021-04-14 NOTE — Telephone Encounter (Signed)
Patient has been going to an Allergist, started Montelukast and levocetirizine. Felt like she didin't have an energy and slow metabolism. Stopped taking it three days ago and feels the same. Please advise.

## 2021-04-15 ENCOUNTER — Encounter: Payer: Self-pay | Admitting: Adult Health

## 2021-04-17 NOTE — Progress Notes (Signed)
VIALS MADE. EXP 04-17-22

## 2021-04-17 NOTE — Addendum Note (Signed)
Addended by: Valentina Shaggy on: 04/17/2021 01:19 PM   Modules accepted: Orders

## 2021-04-17 NOTE — Progress Notes (Signed)
Aeroallergen Immunotherapy   Ordering Provider: Dr. Salvatore Marvel   Patient Details  Name: MATTI KILLINGSWORTH  MRN: [id number]  Date of Birth: 02/28/1956   Order 2 of 2   Vial Label: RW/Mold   0.3 ml (Volume)  1:20 Concentration -- Ragweed Mix  0.2 ml (Volume)  1:20 Concentration -- Bipolaris sorokiniana  0.2 ml (Volume)  1:20 Concentration -- Drechslera spicifera  0.2 ml (Volume)  1:10 Concentration -- Mucor plumbeus  0.2 ml (Volume)  1:10 Concentration -- Fusarium moniliforme  0.2 ml (Volume)  1:40 Concentration -- Aureobasidium pullulans  0.2 ml (Volume)  1:10 Concentration -- Rhizopus oryzae    1.5  ml Extract Subtotal  3.5  ml Diluent  5.0  ml Maintenance Total   Schedule:  A   Blue Vial (1:100,000): Schedule A (10 doses)  Yellow Vial (1:10,000): Schedule A (10 doses)  Green Vial (1:1,000): Schedule A (10 doses)  Red Vial (1:100): Schedule A (10 doses)   Special Instructions: none

## 2021-04-17 NOTE — Progress Notes (Signed)
Aeroallergen Immunotherapy   Ordering Provider: Dr. Salvatore Marvel   Patient Details  Name: Margaret Gilmore  MRN: [id number]  Date of Birth: 12/12/1955   Order 1 of 2   Vial Label: G/T/C/D   0.3 ml (Volume)  BAU Concentration -- [place] 10,000  0.2 ml (Volume)  1:20 Concentration -- Sorce  0.5 ml (Volume)  1:20 Concentration -- Eastern 10 Tree Mix (also Sweet Gum)  0.5 ml (Volume)  1:10 Concentration -- Cat Hair  0.5 ml (Volume)  1:10 Concentration -- Dog Epithelia    2.0  ml Extract Subtotal  3.0  ml Diluent  5.0  ml Maintenance Total   Schedule:  A   Blue Vial (1:100,000): Schedule A (10 doses)  Yellow Vial (1:10,000): Schedule A (10 doses)  Green Vial (1:1,000): Schedule A (10 doses)  Red Vial (1:100): Schedule A (10 doses)   Special Instructions: none

## 2021-04-18 DIAGNOSIS — J3081 Allergic rhinitis due to animal (cat) (dog) hair and dander: Secondary | ICD-10-CM | POA: Diagnosis not present

## 2021-04-21 DIAGNOSIS — J302 Other seasonal allergic rhinitis: Secondary | ICD-10-CM | POA: Diagnosis not present

## 2021-04-29 ENCOUNTER — Ambulatory Visit (INDEPENDENT_AMBULATORY_CARE_PROVIDER_SITE_OTHER): Payer: PPO | Admitting: Adult Health

## 2021-04-29 ENCOUNTER — Encounter: Payer: Self-pay | Admitting: Adult Health

## 2021-04-29 ENCOUNTER — Other Ambulatory Visit: Payer: Self-pay

## 2021-04-29 VITALS — BP 112/74 | HR 91 | Temp 97.3°F | Wt 200.0 lb

## 2021-04-29 DIAGNOSIS — R519 Headache, unspecified: Secondary | ICD-10-CM

## 2021-04-29 DIAGNOSIS — R6889 Other general symptoms and signs: Secondary | ICD-10-CM | POA: Diagnosis not present

## 2021-04-29 DIAGNOSIS — Z1152 Encounter for screening for COVID-19: Secondary | ICD-10-CM

## 2021-04-29 LAB — POCT INFLUENZA A/B
Influenza A, POC: NEGATIVE
Influenza B, POC: NEGATIVE

## 2021-04-29 LAB — POC COVID19 BINAXNOW: SARS Coronavirus 2 Ag: NEGATIVE

## 2021-04-29 MED ORDER — PREDNISONE 20 MG PO TABS: ORAL_TABLET | ORAL | 0 refills | Status: DC

## 2021-04-29 MED ORDER — NURTEC 75 MG PO TBDP: ORAL_TABLET | ORAL | 0 refills | Status: DC

## 2021-04-29 NOTE — Progress Notes (Signed)
Assessment and Plan:  Margaret Gilmore was seen today for acute visit.  Diagnoses and all orders for this visit:  Persistent headaches Possible migraine/med overuse,  Given nurtec sample to try x 2  Do prednisone taper if doesn't resolve with this Cautioned excedrine shouldn't be used more than 2-3 doses/week due to rebound headache risk Does have cervical tension - consider PT/dry needling referral Otherwise benign exam  Follow up in 7-10 days if persistent/recurrent  Consider depression, OSA, - check labs if persistent for CBC, TSH, CMP -     Rimegepant Sulfate (NURTEC) 75 MG TBDP; Take 1 tab for migraine; can repeat in 2 hours if needed. -     predniSONE (DELTASONE) 20 MG tablet; 2 tablets daily for 3 days, 1 tablet daily for 4 days.  Encounter for screening for COVID-19 -     POC COVID-19 BinaxNow  Flu-like symptoms -     POCT Influenza A/B  Further disposition pending results of labs. Discussed med's effects and SE's.   Over 20 minutes of exam, counseling, chart review, and critical decision making was performed.   Future Appointments  Date Time Provider Margaret Gilmore  04/30/2021  9:30 AM [facility] NURSE [facility] None  07/02/2021  9:30 AM Margaret Charon, FNP [facility] None  07/10/2021 10:30 AM Margaret Comber, NP [facility] None  01/29/2022 10:00 AM Margaret Comber, NP [facility] None  03/11/2022  9:15 AM Margaret Pedro, MD [facility] None    ------------------------------------------------------------------------------------------------------------------   HPI BP 112/74    Pulse 91    Temp (!) 97.3 F (36.3 C)    Wt 200 lb (90.7 kg)    SpO2 98%    BMI 33.28 kg/m  65 y.o.female presents for evaluation of fatigue and increased headaches in the last 2 weeks. She had negative covid 19 and flu screen prior to being seen today.   She reports has headaches at baseline, has hx of tension migraines in the remote past; She reports for about 2 weeks she had gradual onset of no  energy, no motivation to get up or move, increased headaches, near constant, bilateral, temporal pressure/throbbing at this time, with photosensitivity. Denies nausea. She has been taking frequent excedrine for this, helps some but recurrent.   She reports will wake up not feeling rested, waking up with hoarseness, no sure if she snores (no partner). She has seasonal allergies, reports typical treatment was making HA worse and stopped. She reports is about to start allergy shots with specialist.   She denies fever/chills, CP/heaviness, dizziness, edema, cough, dyspnea, myalgias/body aches, sore throat, rash, tick bite, recent travel. Denies dark stools, blood in stool. Denies vision change.   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 She has been on wellbutrin for mood, does feel more depressed, didn't do well with lexapro (felt flat/more tired).   Lab Results  Component Value Date   WBC 7.8 01/29/2021   HGB 13.0 01/29/2021   HCT 40.5 01/29/2021   MCV 88.4 01/29/2021   PLT 340 01/29/2021   Lab Results  Component Value Date   TSH 1.79 01/29/2021   Lab Results  Component Value Date   VITAMINB12 1,130 (H) 01/29/2021      Past Medical History:  Diagnosis Date   Anxiety    situational   Arthritis    B12 deficiency    hx of , no longer taking medication   Cataracts, bilateral  Constipation    Depression    situational   Esophageal stricture    Family history of adverse reaction to anesthesia    mom had problems waking up   Fatty liver    GERD (gastroesophageal reflux disease)    Headache    migraines    Macular hole of left eye    Retinal detachment with multiple breaks, right 07/27/2017   Trigeminal neuralgia    right side of face is numb   Unspecified vitamin D deficiency      Allergies  Allergen Reactions   Pregabalin  Swelling   Diphenhydramine Nausea And Vomiting    Severe headaches  Severe headaches  Severe headaches    Nitrous Oxide Other (See Comments)    Pt has gas bubble in right eye- will cause blindness per pt  Pt has gas bubble in right eye- will cause blindness per pt  Pt has gas bubble in right eye- will cause blindness per pt    Sulfa Antibiotics Other (See Comments)    Headache    Current Outpatient Medications on File Prior to Visit  Medication Sig   albuterol (VENTOLIN HFA) 108 (90 Base) MCG/ACT inhaler Inhale 2 puffs into the lungs every 6 (six) hours as needed for wheezing or shortness of breath.   Ascorbic Acid (VITAMIN C PO) Take by mouth daily.   aspirin-acetaminophen-caffeine (EXCEDRIN EXTRA STRENGTH) 250-250-65 MG tablet Take 1 tablet by mouth every 6 (six) hours as needed for headache.   buPROPion (WELLBUTRIN) 75 MG tablet Take 75 mg by mouth every morning.   Carbinoxamine Maleate 4 MG TABS Take 1 tablet (4 mg total) by mouth 3 (three) times daily.   Carbinoxamine Maleate ER 4 MG/5ML SUER Take 4 mLs by mouth 4 (four) times daily as needed (3-4 times daily as needed).   Cholecalciferol (VITAMIN D3) 100000 UNIT/GM POWD Take by mouth.   Esomeprazole Magnesium (NEXIUM PO) Take by mouth daily.   levocetirizine (XYZAL) 5 MG tablet Take 1 tablet (5 mg total) by mouth every evening.   Magnesium 200 MG CHEW Chew by mouth.   montelukast (SINGULAIR) 10 MG tablet Take 1 tablet (10 mg total) by mouth at bedtime.   montelukast (SINGULAIR) 10 MG tablet Take 1 tablet (10 mg total) by mouth at bedtime.   Multiple Vitamins-Minerals (MULTIVITAMIN WITH MINERALS) tablet Take 1 tablet by mouth daily.   Omega-3 Fatty Acids (FISH OIL) 1200 MG CAPS    Polyethylene Glycol 3350 (MIRALAX PO) Take by mouth.   vitamin B-12 (CYANOCOBALAMIN) 500 MCG tablet Take 500 mcg by mouth daily.   VITAMIN D PO Take 2,000 Units by mouth daily.   No current facility-administered medications on file prior to visit.     ROS: all negative except above.   Physical Exam:  BP 112/74    Pulse 91    Temp (!) 97.3 F (36.3 C)    Wt 200 lb (90.7 kg)    SpO2 98%    BMI 33.28 kg/m   General Appearance: Well nourished, in no apparent distress. Eyes: PERRLA, EOMs, conjunctiva no swelling or erythema Sinuses: No Frontal/maxillary tenderness ENT/Mouth: Ext aud canals clear, TMs without erythema, bulging. No erythema, swelling, or exudate on post pharynx.  Tonsils not swollen or erythematous. Hearing normal. R nare chronically deformed/absent; no open wounds or ulcerations; no discharge.  Neck: Supple, no rigidity, tenderness, thyroid normal.  Respiratory: Respiratory effort normal, BS equal bilaterally without rales, rhonchi, wheezing or stridor.  Cardio: RRR with no MRGs. Brisk peripheral  pulses without edema.  Abdomen: Soft, + BS.  Non tender, no guarding, rebound, hernias, masses. Lymphatics: Non tender without lymphadenopathy.  Musculoskeletal: Full ROM, 5/5 strength, normal gait. Tight traps bil. No spinous tenderness.  Skin: Warm, dry without rashes, lesions, ecchymosis.  Neuro: Cranial nerves intact. Normal muscle tone, no cerebellar symptoms. Sensation intact.  Psych: Awake and oriented X 3, normal affect, Insight and Judgment appropriate.     Izora Ribas, NP 2:43 PM [facility]

## 2021-04-30 ENCOUNTER — Ambulatory Visit (INDEPENDENT_AMBULATORY_CARE_PROVIDER_SITE_OTHER): Payer: PPO | Admitting: *Deleted

## 2021-04-30 ENCOUNTER — Telehealth: Payer: Self-pay | Admitting: *Deleted

## 2021-04-30 DIAGNOSIS — J309 Allergic rhinitis, unspecified: Secondary | ICD-10-CM

## 2021-04-30 MED ORDER — EPINEPHRINE 0.3 MG/0.3ML IJ SOAJ: 0.3000 mg | Freq: Once | INTRAMUSCULAR | 1 refills | Status: AC

## 2021-04-30 NOTE — Telephone Encounter (Signed)
I called and spoke with the patient she stated the Loratadine didn't help her at all. She stated she has taken Cetirizine but she can't remember if it helped or caused headaches. She started taking name brand Xyzal today and is going to see how it help and will call us back if she wants to switch to something different.

## 2021-04-30 NOTE — Progress Notes (Signed)
Immunotherapy   Patient Details  Name: Margaret Gilmore MRN: [id number] Date of Birth: 10-26-1955  04/30/2021  Michaell Cowing started injections for   G-T-C-D, RW-MOLD Following schedule: A  Frequency:1 time per week Epi-Pen:Epi-Pen Available  Consent signed and patient instructions given. Patient started allergy injections and received 0.63mL of G-T-C-D in the RUA and 0.28mL of RW-MOLD in the LUA. Patient waited 30 minutes in office and did not experience any issues.    Cesily Cuoco Fernandez-Vernon 04/30/2021, 9:52 AM

## 2021-04-30 NOTE — Telephone Encounter (Signed)
Patient started allergy shots today and stated that she is not currently taking any antihistamines. We prescribed Levocetirizine and Montelukast and she states that they gave her terrible headaches, even when taking them separately she still had issues with headaches. She states that Allegra was fine for her in just didn't work to control her allergies anymore. Please advise a different medication she may can take while on injections especially. Thank You.

## 2021-04-30 NOTE — Telephone Encounter (Signed)
I agree. But she did state that she will let us know if it is not working.

## 2021-04-30 NOTE — Telephone Encounter (Signed)
I would imagine that since Xyzal is the same as levocetirizine, that she will develop headaches. But we shall see!   Salvatore Marvel, MD [facility]

## 2021-04-30 NOTE — Telephone Encounter (Signed)
Has she tried loratadine or cetirizine?   Salvatore Marvel, MD [facility]

## 2021-05-07 ENCOUNTER — Ambulatory Visit (INDEPENDENT_AMBULATORY_CARE_PROVIDER_SITE_OTHER): Payer: PPO

## 2021-05-07 DIAGNOSIS — J309 Allergic rhinitis, unspecified: Secondary | ICD-10-CM

## 2021-05-13 ENCOUNTER — Encounter: Payer: Self-pay | Admitting: Adult Health

## 2021-05-14 ENCOUNTER — Ambulatory Visit (INDEPENDENT_AMBULATORY_CARE_PROVIDER_SITE_OTHER): Payer: PPO | Admitting: *Deleted

## 2021-05-14 DIAGNOSIS — J309 Allergic rhinitis, unspecified: Secondary | ICD-10-CM

## 2021-05-21 ENCOUNTER — Ambulatory Visit (INDEPENDENT_AMBULATORY_CARE_PROVIDER_SITE_OTHER): Payer: PPO

## 2021-05-21 DIAGNOSIS — J309 Allergic rhinitis, unspecified: Secondary | ICD-10-CM

## 2021-05-22 DIAGNOSIS — F4320 Adjustment disorder, unspecified: Secondary | ICD-10-CM | POA: Diagnosis not present

## 2021-05-22 DIAGNOSIS — F419 Anxiety disorder, unspecified: Secondary | ICD-10-CM | POA: Diagnosis not present

## 2021-05-28 ENCOUNTER — Ambulatory Visit (INDEPENDENT_AMBULATORY_CARE_PROVIDER_SITE_OTHER): Payer: PPO

## 2021-05-28 DIAGNOSIS — J309 Allergic rhinitis, unspecified: Secondary | ICD-10-CM

## 2021-05-29 DIAGNOSIS — F419 Anxiety disorder, unspecified: Secondary | ICD-10-CM | POA: Diagnosis not present

## 2021-05-29 DIAGNOSIS — F4320 Adjustment disorder, unspecified: Secondary | ICD-10-CM | POA: Diagnosis not present

## 2021-06-03 DIAGNOSIS — F4320 Adjustment disorder, unspecified: Secondary | ICD-10-CM | POA: Diagnosis not present

## 2021-06-03 DIAGNOSIS — F419 Anxiety disorder, unspecified: Secondary | ICD-10-CM | POA: Diagnosis not present

## 2021-06-04 ENCOUNTER — Ambulatory Visit (INDEPENDENT_AMBULATORY_CARE_PROVIDER_SITE_OTHER): Payer: PPO

## 2021-06-04 DIAGNOSIS — J309 Allergic rhinitis, unspecified: Secondary | ICD-10-CM

## 2021-06-11 DIAGNOSIS — F4320 Adjustment disorder, unspecified: Secondary | ICD-10-CM | POA: Diagnosis not present

## 2021-06-11 DIAGNOSIS — F419 Anxiety disorder, unspecified: Secondary | ICD-10-CM | POA: Diagnosis not present

## 2021-06-13 ENCOUNTER — Ambulatory Visit (INDEPENDENT_AMBULATORY_CARE_PROVIDER_SITE_OTHER): Payer: PPO

## 2021-06-13 DIAGNOSIS — J309 Allergic rhinitis, unspecified: Secondary | ICD-10-CM | POA: Diagnosis not present

## 2021-06-18 ENCOUNTER — Ambulatory Visit (INDEPENDENT_AMBULATORY_CARE_PROVIDER_SITE_OTHER): Payer: PPO

## 2021-06-18 DIAGNOSIS — J309 Allergic rhinitis, unspecified: Secondary | ICD-10-CM

## 2021-06-19 DIAGNOSIS — F419 Anxiety disorder, unspecified: Secondary | ICD-10-CM | POA: Diagnosis not present

## 2021-06-19 DIAGNOSIS — F4320 Adjustment disorder, unspecified: Secondary | ICD-10-CM | POA: Diagnosis not present

## 2021-06-25 ENCOUNTER — Ambulatory Visit (INDEPENDENT_AMBULATORY_CARE_PROVIDER_SITE_OTHER): Payer: PPO

## 2021-06-25 DIAGNOSIS — J309 Allergic rhinitis, unspecified: Secondary | ICD-10-CM

## 2021-06-25 NOTE — Progress Notes (Signed)
AWV AND FOLLOW UP  Assessment and Plan:   Annual Medicare Wellness Visit Due annually  Health maintenance reviewed - bone density - defer per patient   Vitamin D deficiency Continue supplement for goal 50-100 range  Hx of prediabetes Discussed disease and risks Discussed diet/exercise, weight management  Check A1C annually and as needed  Obesity (BMI 30.0-34.9) Long discussion about weight loss, diet, and exercise Recommended diet heavy in fruits and veggies and low in animal meats, cheeses, and dairy products, appropriate calorie intake Discussed appropriate weight for height Try cinnamon 1 g and berberine 500 mg taper up to taking with each meal, may provider GLP1 benefits per some studies discussed Encouraged whole foods, high protein and fiber diet, 30 min of exercise daily Strategies for intentional eating, avoiding boredom reviewed Follow up at next visit  Macular hole, right eye/left eye Followed by ophthalmology - declining further surgeries at this time  Medication management Due twice annually with routine visits  RBBB Monitor, avoid rate controlling agents  Acquired deformity of nose Discussing with [name] plastics; avoid nasal steroids/meds  Major depressive disorder, in partial remission ([name]) Off of meds per patient preference, she feels doing well with therapy  Lifestyle discussed: diet/exerise, sleep hygiene, stress management, hydration  B12 Recheck levels, continue supplement as indicated  Need for influenza vaccine High dose quadrivalent administered without complication today    Orders Placed This Encounter  Procedures   Flu vaccine HIGH DOSE PF   CBC with Differential/Platelet   COMPLETE METABOLIC PANEL WITH GFR   Magnesium   Lipid panel   TSH    Discussed med's effects and SE's. Screening labs and tests as requested with regular follow-up as recommended. Over 40 minutes of exam, counseling, chart review, and complex, high level critical  decision making was performed this visit.   Future Appointments  Date Time Provider [place]  07/02/2021  9:30 AM Margaret Charon, FNP [facility] None  01/29/2022 10:00 AM Margaret Comber, NP [facility] None  03/11/2022  9:15 AM Margaret Pedro, MD [facility] None  07/21/2022 11:00 AM Margaret Comber, NP [facility] None     Plan:   During the course of the visit the patient was educated and counseled about appropriate screening and preventive services including:   Pneumococcal vaccine  Prevnar 13 Influenza vaccine Td vaccine Screening electrocardiogram Bone densitometry screening Colorectal cancer screening Diabetes screening Glaucoma screening Nutrition counseling  Advanced directives: requested    HPI  66 y.o. female  presents for AWV and follow up. She  has Depression, major, recurrent ([place]); Vitamin D deficiency; Anxiety; Other abnormal glucose (hx of prediabetes); Macular hole, left eye; Macular hole, right eye; Obesity (BMI 30.0-34.9); Right bundle branch block (RBBB); Chronic rhinitis; Nasal deformity, acquired; and Hyperlipidemia on their problem list.   She is single. She is currently out of work due to multiple eye procedures (macular holes, surgery x 3, declining further), no longer driving, daughter or mom drives her if needed. Avoids going out at night or high risk fall situations. Denies falls or near falls this year.   She has acquired nasal deformity following steroid injections remotely for possible discoid lupus, now has chronic deformity, nasal discharge and nasal crusting, saw ENT in [facility] Dr. Melida Gilmore, was referred to cosmetic surgeon and allergist.  Some question of habitual self excoriation of nose, OCD tendencies, was referred for therapy and pscyh. Hx of depression/anxiety as well. She was advised need for complete therapy (1 year) before she can have plastic surgery  Margaret Gilmore at [name]. Did not do well with lexapro or wellbutrin, feels  better off of med, doing therapy.   Has history of GERD and has had EGD/dilitation in 2015 and again in 2019. She is taking nexium daily.   BMI is Body mass index is 34.61 kg/m., she has been working on diet and exercise, working up on steps, using fit bit and doing stepping video. Admits not as good with diet, trying to do protein drink. Admits to some boredom eating. She would like to reading, will try audiobook/ebook.  Trying to drink more fluids, doing crystal lite.  Wt Readings from Last 3 Encounters:  06/26/21 208 lb (94.3 kg)  04/29/21 200 lb (90.7 kg)  04/02/21 200 lb 12.8 oz (91.1 kg)   Today their BP is BP: 124/74 She does workout. She denies chest pain, shortness of breath, dizziness.   She is not on cholesterol medication and denies myalgias. Her cholesterol is not at goal, higher than usual last visit. The cholesterol last visit was:   Lab Results  Component Value Date   CHOL 203 (H) 01/29/2021   HDL 47 (L) 01/29/2021   LDLCALC 126 (H) 01/29/2021   TRIG 180 (H) 01/29/2021   CHOLHDL 4.3 01/29/2021   She has been working on diet and exercise for hx of prediabetes, she is not on bASA, she is not on ACE/ARB and denies foot ulcerations, increased appetite, nausea, paresthesia of the feet, polydipsia, polyuria, vomiting and weight loss. Last A1C in the office was:  Lab Results  Component Value Date   HGBA1C 5.6 01/29/2021   Last GFR: Lab Results  Component Value Date   GFRNONAA 80 07/10/2020   Patient is on Vitamin D supplement, taking 2000 IU daily   Lab Results  Component Value Date   VD25OH 61 01/29/2021     She takes chewable supplement daily, feels helps her energy, has done shots in the past Lab Results  Component Value Date   VITAMINB12 1,130 (H) 01/29/2021     Current Medications:  Current Outpatient Medications on File Prior to Visit  Medication Sig Dispense Refill   Ascorbic Acid (VITAMIN C PO) Take by mouth daily.     Cholecalciferol (VITAMIN D3)  100000 UNIT/GM POWD Take by mouth.     Esomeprazole Magnesium (NEXIUM PO) Take by mouth daily.     levocetirizine (XYZAL) 5 MG tablet Take 1 tablet (5 mg total) by mouth every evening. 30 tablet 5   Magnesium 200 MG CHEW Chew by mouth.     Multiple Vitamins-Minerals (MULTIVITAMIN WITH MINERALS) tablet Take 1 tablet by mouth daily.     Omega-3 Fatty Acids (FISH OIL) 1200 MG CAPS      Polyethylene Glycol 3350 (MIRALAX PO) Take by mouth.     Rimegepant Sulfate (NURTEC) 75 MG TBDP Take 1 tab for migraine; can repeat in 2 hours if needed. 2 tablet 0   vitamin B-12 (CYANOCOBALAMIN) 500 MCG tablet Take 500 mcg by mouth daily.     VITAMIN D PO Take 2,000 Units by mouth daily.     No current facility-administered medications on file prior to visit.   Allergies:  Allergies  Allergen Reactions   Pregabalin Swelling   Diphenhydramine Nausea And Vomiting    Severe headaches  Severe headaches  Severe headaches    Nitrous Oxide Other (See Comments)    Pt has gas bubble in right eye- will cause blindness per pt  Pt has gas bubble in right eye- will cause  blindness per pt  Pt has gas bubble in right eye- will cause blindness per pt    Sulfa Antibiotics Other (See Comments)    Headache   Medical History:  She has Depression, major, recurrent ([place]); Vitamin D deficiency; Anxiety; Other abnormal glucose (hx of prediabetes); Macular hole, left eye; Macular hole, right eye; Obesity (BMI 30.0-34.9); Right bundle branch block (RBBB); Chronic rhinitis; Nasal deformity, acquired; and Hyperlipidemia on their problem list. Health Maintenance:   Immunization History  Administered Date(s) Administered   Influenza, High Dose Seasonal PF 06/26/2021   Influenza-Unspecified 02/15/2018, 02/16/2020   PPD Test 05/31/2013   Pneumococcal Polysaccharide-23 07/10/2020   Tdap 05/31/2013   Health Maintenance  Topic Date Due   Zoster Vaccines- Shingrix (1 of 2) Never done   Pneumonia Vaccine 62+ Years old (2 - PCV)  07/10/2021   COVID-19 Vaccine (1) 07/12/2021 (Originally 02/19/1956)   MAMMOGRAM  12/21/2022   TETANUS/TDAP  06/01/2023   COLONOSCOPY (Pts 45-43yrs Insurance coverage will need to be confirmed)  08/26/2023   INFLUENZA VACCINE  Completed   DEXA SCAN  Completed   Hepatitis C Screening  Completed   HPV VACCINES  Aged Out   Shingrix: check with insurance  Covid 19: declines  Pap: Remote, never abnormal, s/p TAH, declines further unless problems MGM: gets annually at breast center DEXA: has had, ?2009, normal, postpone for now per pt pref  Colonoscopy: 2015, Dr. Fuller Plan, 10 year recall  EGD: 2019  Last Dental Exam: Dr. Luan Pulling in [place], last visit 2018, encouraged to schedule Last Eye Exam: Dr. Tempie Hoist, [facility], last visit 2022, goes q34m  Patient Care Team: Unk Pinto, MD as PCP - General (Internal Medicine)  Surgical History:  She has a past surgical history that includes Abdominal hysterectomy; Cesarean section; trigeminal ; Appendectomy; knot removed from right side of abdomen; Colonoscopy; 25 gauge pars plana vitrectomy with 20 gauge mvr port for macular hole (Left, 03/05/2015); Membrane peel (Left, 03/05/2015); Serum patch (Left, 03/05/2015); Laser photo ablation (Left, 03/05/2015); Gas/fluid exchange (Left, 03/05/2015); 25 gauge pars plana vitrectomy with 20 gauge mvr port for macular hole (Right, 06/22/2017); 25 gauge pars plana vitrectomy with 20 gauge mvr port for macular hole (Right, 06/22/2017); Gas/fluid exchange (Right, 06/22/2017); Laser photo ablation (Right, 06/22/2017); Serum patch (Right, 06/22/2017); Membrane peel (Right, 06/22/2017); Scleral buckle with possible 25 gauge pars plana vitrectomy (Right, 07/27/2017); Laser photo ablation (Right, 07/27/2017); Gas/fluid exchange (Right, 07/27/2017); Membrane peel (Right, 07/27/2017); 25 gauge pars plana vitrectomy with 20 gauge mvr port (Left, 11/09/2017); Esophagogastroduodenoscopy (egd) with esophageal  dilation; Eye surgery; 25 gauge pars plana vitrectomy with 20 gauge mvr port (Left, 11/09/2017); Membrane peel (Left, 11/09/2017); Gas/fluid exchange (Left, 11/09/2017); Serum patch (Left, 11/09/2017); and Laser photo ablation (Left, 11/09/2017). Family History:  Herfamily history includes Alzheimer's disease in her maternal aunt and maternal grandmother; Bone cancer in her maternal grandfather; Breast cancer in her maternal aunt; COPD in her mother; Cancer in her paternal uncle; Colon cancer in her paternal uncle; Diabetes in her mother; Heart disease in her father; Hodgkin's lymphoma in her paternal aunt; Hyperlipidemia in her father and mother; Hypertension in her brother and father; Pancreatic cancer (age of onset: 58) in her father; Prostate cancer in her maternal grandfather. Social History:  She reports that she has never smoked. She has never used smokeless tobacco. She reports that she does not drink alcohol and does not use drugs.  MEDICARE WELLNESS OBJECTIVES: Physical activity: Current Exercise Habits: Home exercise routine, Type  of exercise: strength training/weights, Time (Minutes): 30, Frequency (Times/Week): 2, Weekly Exercise (Minutes/Week): 60, Intensity: Mild, Exercise limited by: None identified Cardiac risk factors: Cardiac Risk Factors include: advanced age (>69men, >9 women);dyslipidemia;hypertension;obesity (BMI >30kg/m2);sedentary lifestyle Depression/mood screen:   Depression screen [facility] 2/9 06/26/2021  Decreased Interest 0  Down, Depressed, Hopeless 1  PHQ - 2 Score 1  Altered sleeping -  Tired, decreased energy -  Change in appetite -  Feeling bad or failure about yourself  -  Trouble concentrating -  Moving slowly or fidgety/restless -  Suicidal thoughts -  PHQ-9 Score -  Difficult doing work/chores -    ADLs:  In your present state of health, do you have any difficulty performing the following activities: 06/26/2021 07/10/2020  Hearing? N N  Vision? Y N  Comment  macular holes, no further interventions -  Difficulty concentrating or making decisions? N N  Walking or climbing stairs? N N  Dressing or bathing? N N  Doing errands, shopping? Tempie Donning  Comment family drives as needed no longer driving, daughter and mom drives as needed  Preparing Food and eating ? N -  Using the Toilet? N -  In the past six months, have you accidently leaked urine? N -  Do you have problems with loss of bowel control? N -  Managing your Medications? N -  Managing your Finances? N -  Housekeeping or managing your Housekeeping? N -  Some recent data might be hidden     Cognitive Testing  Alert? Yes  Normal Appearance?Yes  Oriented to person? Yes  Place? Yes   Time? Yes  Recall of three objects?  Yes  Can perform simple calculations? Yes  Displays appropriate judgment?Yes  Can read the correct time from a watch face?Yes  EOL planning: Does Patient Have a Medical Advance Directive?: No Would patient like information on creating a medical advance directive?: No - Patient declined     Review of Systems: Review of Systems  Constitutional:  Negative for malaise/fatigue and weight loss.  HENT:  Negative for hearing loss and tinnitus.        Chronic rhinitis  Eyes:  Positive for blurred vision (chronic). Negative for double vision.  Respiratory:  Negative for cough, sputum production, shortness of breath and wheezing.   Cardiovascular:  Negative for chest pain, palpitations, orthopnea, claudication, leg swelling and PND.  Gastrointestinal:  Negative for abdominal pain, blood in stool, constipation, diarrhea, heartburn, melena, nausea and vomiting.  Genitourinary: Negative.   Musculoskeletal:  Negative for falls, joint pain and myalgias.  Skin:  Negative for rash.  Neurological:  Negative for dizziness, tingling, sensory change, weakness and headaches.  Endo/Heme/Allergies:  Negative for environmental allergies and polydipsia.  Psychiatric/Behavioral:  Positive for  depression. Negative for memory loss, substance abuse and suicidal ideas. The patient is not nervous/anxious and does not have insomnia.   All other systems reviewed and are negative.  Physical Exam: Estimated body mass index is 34.61 kg/m as calculated from the following:   Height as of 04/02/21: 5\' 5"  (1.651 m).   Weight as of this encounter: 208 lb (94.3 kg). BP 124/74    Pulse 83    Temp (!) 97.5 F (36.4 C)    Wt 208 lb (94.3 kg)    SpO2 97%    BMI 34.61 kg/m  General Appearance: Well nourished, well dressed elder female, in no apparent distress.  Eyes: R pupil minimally reactive, left round and reactive, EOMs, conjunctiva no swelling or erythema Sinuses:  No Frontal/maxillary tenderness  ENT/Mouth: Ext aud canals clear, normal light reflex with TMs without erythema, bulging. Good dentition. No erythema, swelling, or exudate on post pharynx. Tonsils not swollen or erythematous. Hearing normal. R nare chronically deformed/absent; no open wounds or ulcerations; clear crusty discharge.  Neck: Supple, thyroid normal. No bruits  Respiratory: Respiratory effort normal, BS equal bilaterally without rales, rhonchi, wheezing or stridor.  Cardio: RRR without murmurs, rubs or gallops. Brisk peripheral pulses without edema.  Abdomen: Soft, nontender, no guarding, rebound, hernias, masses, or organomegaly.  Lymphatics: Non tender without lymphadenopathy.  Musculoskeletal: Full ROM all peripheral extremities,5/5 strength, and normal gait.  Skin: Warm, dry without rashes, lesions, ecchymosis. 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 Normal muscle tone, no cerebellar symptoms. Sensation intact.  Psych: Awake and oriented X 3, normal affect, Insight and Judgment appropriate.   Medicare Attestation I have personally reviewed: The patient's medical and social history Their use of alcohol, tobacco or illicit drugs Their current medications and  supplements The patient's functional ability including ADLs,fall risks, home safety risks, cognitive, and hearing and visual impairment Diet and physical activities Evidence for depression or mood disorders  The patient's weight, height, BMI, and visual acuity have been recorded in the chart.  I have made referrals, counseling, and provided education to the patient based on review of the above and I have provided the patient with a written personalized care plan for preventive services.      Izora Ribas, NP-C 12:54 PM [facility]

## 2021-06-26 ENCOUNTER — Ambulatory Visit (INDEPENDENT_AMBULATORY_CARE_PROVIDER_SITE_OTHER): Payer: PPO | Admitting: Adult Health

## 2021-06-26 ENCOUNTER — Other Ambulatory Visit: Payer: Self-pay

## 2021-06-26 ENCOUNTER — Encounter: Payer: Self-pay | Admitting: Adult Health

## 2021-06-26 VITALS — BP 124/74 | HR 83 | Temp 97.5°F | Wt 208.0 lb

## 2021-06-26 DIAGNOSIS — Z79899 Other long term (current) drug therapy: Secondary | ICD-10-CM

## 2021-06-26 DIAGNOSIS — M950 Acquired deformity of nose: Secondary | ICD-10-CM

## 2021-06-26 DIAGNOSIS — R7309 Other abnormal glucose: Secondary | ICD-10-CM

## 2021-06-26 DIAGNOSIS — F419 Anxiety disorder, unspecified: Secondary | ICD-10-CM

## 2021-06-26 DIAGNOSIS — E559 Vitamin D deficiency, unspecified: Secondary | ICD-10-CM

## 2021-06-26 DIAGNOSIS — F3341 Major depressive disorder, recurrent, in partial remission: Secondary | ICD-10-CM

## 2021-06-26 DIAGNOSIS — Z23 Encounter for immunization: Secondary | ICD-10-CM

## 2021-06-26 DIAGNOSIS — Z0001 Encounter for general adult medical examination with abnormal findings: Secondary | ICD-10-CM

## 2021-06-26 DIAGNOSIS — I4510 Unspecified right bundle-branch block: Secondary | ICD-10-CM

## 2021-06-26 DIAGNOSIS — E669 Obesity, unspecified: Secondary | ICD-10-CM

## 2021-06-26 DIAGNOSIS — R6889 Other general symptoms and signs: Secondary | ICD-10-CM

## 2021-06-26 DIAGNOSIS — F331 Major depressive disorder, recurrent, moderate: Secondary | ICD-10-CM

## 2021-06-26 DIAGNOSIS — Z0000 Encounter for general adult medical examination without abnormal findings: Secondary | ICD-10-CM

## 2021-06-26 DIAGNOSIS — F4320 Adjustment disorder, unspecified: Secondary | ICD-10-CM | POA: Diagnosis not present

## 2021-06-26 DIAGNOSIS — J310 Chronic rhinitis: Secondary | ICD-10-CM

## 2021-06-26 DIAGNOSIS — E785 Hyperlipidemia, unspecified: Secondary | ICD-10-CM

## 2021-06-26 NOTE — Patient Instructions (Addendum)
Margaret Gilmore , Thank you for taking time to come for your Medicare Wellness Visit. I appreciate your ongoing commitment to your health goals. Please review the following plan we discussed and let me know if I can assist you in the future.   These are the goals we discussed:  Goals      DIET - EAT MORE FRUITS AND VEGETABLES     DIET - INCREASE WATER INTAKE     Exercise 150 min/wk Moderate Activity        This is a list of the screening recommended for you and due dates:  Health Maintenance  Topic Date Due   Zoster (Shingles) Vaccine (1 of 2) Never done   Flu Shot  12/16/2020   Pneumonia Vaccine (2 - PCV) 07/10/2021   COVID-19 Vaccine (1) 07/12/2021*   Mammogram  12/21/2022   Tetanus Vaccine  06/01/2023   Colon Cancer Screening  08/26/2023   DEXA scan (bone density measurement)  Completed   Hepatitis C Screening: USPSTF Recommendation to screen - Ages 57-79 yo.  Completed   HPV Vaccine  Aged Out  *Topic was postponed. The date shown is not the original due date.     Try 1000 mg (1 g) of cinnamon and 500 mg berberine with 1 meal for 1 week, then if tolerating well add with second meal, then take with each meal for appetite and weight loss  Recommend avoiding processed foods -  Try mostly whole foods  High fiber and protein with each meal/snack      Protein Content in Foods Protein is a necessary nutrient in any diet. It helps build and repair muscles, bones, and skin. Depending on your overall health, you may need more or less protein in your diet. You are encouraged to eat a variety of protein foods to ensure that you get all the essential nutrients that are found in different protein foods. Talk with your health care provider or dietitian about how much protein you need each day and which sources of protein are best for you. Protein is especially important for: Repairing and making cells and tissues. Fighting infection. Providing energy. Growth and development. See the  following list for the protein content of some common foods. What are tips for getting more protein in your diet? Try to replace processed carbohydrates with high-quality protein. Snack on nuts and seeds instead of chips. Replace baked desserts with [place] yogurt. Eat protein foods from both plant and animal sources. Replace red meat with seafood. Add beans and peas to salads, soups, and side dishes. Include a protein food with each meal and snack. Reading food labels You can find the amount of protein in a food item by looking at the nutrition facts label. Use the total grams listed to help you reach your daily goal. What foods are high in protein? High-protein foods contain 4 grams (g) or more of protein per serving. They include: Grains Quinoa (cooked) -- 1 cup (185 g) has 8 g of protein. Whole wheat pasta (cooked) -- 1 cup (140 g) has 6 g of protein. Meat Beef, ground sirloin (cooked) -- 3 oz (85 g) has 24 g of protein. Chicken breast, boneless and skinless (cooked) -- 3 oz (85 g) has 25 g of protein. Egg -- 1 egg has 6 g of protein. Fish, filet (cooked) -- 1 oz (28 g) has 6-7 g of protein. Lamb (cooked) -- 3 oz (85 g) has 24 g of protein. Pork tenderloin (cooked) -- 3 oz (85  g) has 23 g of protein. Tuna (canned in water) -- 3 oz (85 g) has 20 g of protein. Dairy Cottage cheese --  cup (114 g) has 13.4 g of protein. Milk -- 1 cup (237 mL) has 8 g of protein. Cheese (hard) -- 1 oz (28 g) has 7 g of protein. Yogurt, regular -- 6 oz (170 g) has 8 g of protein. Greek yogurt -- 6 oz (200 g) has 18 g protein. Plant protein Garbanzo beans (canned or cooked) --  cup (130 g) has 6-7 g of protein. Kidney beans (canned or cooked) --  cup (130 g) has 6-7 g of protein. Nuts (peanuts, pistachios, almonds) -- 1 oz (28 g) has 6 g of protein. Peanut butter -- 1 oz (32 g) has 7-8 g of protein. Pumpkin seeds -- 1 oz (28 g) has 8.5 g of protein. Soybeans (roasted) -- 1 oz (28 g) has 8 g of  protein. Soybeans (cooked) --  cup (90 g) has 11 g of protein. Soy milk -- 1 cup (250 mL) has 5-10 g of protein. Soy or vegetable patty -- 1 patty has 11 g of protein. Sunflower seeds -- 1 oz (28 g) has 5.5 g of protein. Buckwheat -- 1 oz (33 g) has 4.3 g of protein. Tofu (firm) --  cup (124 g) has 20 g of protein. Tempeh --  cup (83 g) has 16 g of protein. The items listed above may not be a complete list of foods high in protein. Actual amounts of protein may differ depending on processing. Contact a dietitian for more information. What foods are low in protein? Low-protein foods contain 3 grams (g) or less of protein per serving. They include: Fruits Fruit or vegetable juice --  cup (125 mL) has 1 g of protein. Vegetables Beets (raw or cooked) --  cup (68 g) has 1.5 g of protein. Broccoli (raw or cooked) --  cup (44 g) has 2 g of protein. Collard greens (raw or cooked) --  cup (42 g) has 2 g of protein. Green beans (raw or cooked) --  cup (83 g) has 1 g of protein. Green peas (canned) --  cup (80 g) has 3.5 g of protein. Potato (baked with skin) -- 1 medium potato (173 g) has 3 g of protein. Spinach (cooked) --  cup (90 g) has 3 g of protein. Squash (cooked) --  cup (90 g) has 1.5 g of protein. Avocado -- 1 cup (146 g) has 2.7 g of protein. Grains Bran cereal --  cup (30 g) has 2-3 g of protein. Bread -- 1 slice has 2.5 g of protein. Corn (fresh or cooked) --  cup (77 g) has 2 g of protein. Flour tortilla -- One 6-inch (15 cm) tortilla has 2.5 g of protein. Muffins -- 1 small muffin (2 oz or 57 g) has 3 g of protein. Oatmeal (cooked) --  cup (40 g) has 3 g of protein. Rice (cooked) --  cup (79 g) has 2.5-3.5 g of protein. Dairy Cream cheese -- 1 oz (29 g) has 2 g of protein. Creamer (half-and-half) -- 1 oz (29 mL) has 1 g of protein. Frozen yogurt --  cup (72 g) has 3 g of protein. Sour cream --  cup (75 g) has 2.5 g of protein. The items listed above may not  be a complete list of foods low in protein. Actual amounts of protein may differ depending on processing. Contact a dietitian for more information. Summary  Protein is a nutrient that your body needs for growth and development, repairing and making cells and tissues, fighting infection, and providing energy. Protein is in both plant and animal foods. Some of these foods have more protein than others. Depending on your overall health, you may need more or less protein in your diet. Talk to your health care provider about how much protein you need. This information is not intended to replace advice given to you by your health care provider. Make sure you discuss any questions you have with your health care provider. Document Revised: 04/08/2020 Document Reviewed: 04/08/2020 Elsevier Patient Education  2022 [place].     High-Fiber Eating Plan Fiber, also called dietary fiber, is a type of carbohydrate. It is found foods such as fruits, vegetables, whole grains, and beans. A high-fiber diet can have many health benefits. Your health care provider may recommend a high-fiber diet to help: Prevent constipation. Fiber can make your bowel movements more regular. Lower your cholesterol. Relieve the following conditions: Inflammation of veins in the anus (hemorrhoids). Inflammation of specific areas of the digestive tract (uncomplicated diverticulosis). A problem of the large intestine, also called the colon, that sometimes causes pain and diarrhea (irritable bowel syndrome, or IBS). Prevent overeating as part of a weight-loss plan. Prevent heart disease, type 2 diabetes, and certain cancers. What are tips for following this plan? Reading food labels  Check the nutrition facts label on food products for the amount of dietary fiber. Choose foods that have 5 grams of fiber or more per serving. The goals for recommended daily fiber intake include: Men (age 63 or younger): 34-38 g. Men (over age  47): 28-34 g. Women (age 31 or younger): 25-28 g. Women (over age 44): 22-25 g. Your daily fiber goal is _____________ g. Shopping Choose whole fruits and vegetables instead of processed forms, such as apple juice or applesauce. Choose a wide variety of high-fiber foods such as avocados, lentils, oats, and kidney beans. Read the nutrition facts label of the foods you choose. Be aware of foods with added fiber. These foods often have high sugar and sodium amounts per serving. Cooking Use whole-grain flour for baking and cooking. Cook with brown rice instead of white rice. Meal planning Start the day with a breakfast that is high in fiber, such as a cereal that contains 5 g of fiber or more per serving. Eat breads and cereals that are made with whole-grain flour instead of refined flour or white flour. Eat brown rice, bulgur wheat, or millet instead of white rice. Use beans in place of meat in soups, salads, and pasta dishes. Be sure that half of the grains you eat each day are whole grains. General information You can get the recommended daily intake of dietary fiber by: Eating a variety of fruits, vegetables, grains, nuts, and beans. Taking a fiber supplement if you are not able to take in enough fiber in your diet. It is better to get fiber through food than from a supplement. Gradually increase how much fiber you consume. If you increase your intake of dietary fiber too quickly, you may have bloating, cramping, or gas. Drink plenty of water to help you digest fiber. Choose high-fiber snacks, such as berries, raw vegetables, nuts, and popcorn. What foods should I eat? Fruits Berries. Pears. Apples. Oranges. Avocado. Prunes and raisins. Dried figs. Vegetables Sweet potatoes. Spinach. Kale. Artichokes. Cabbage. Broccoli. Cauliflower. Green peas. Carrots. Squash. Grains Whole-grain breads. Multigrain cereal. Oats and oatmeal. Brown rice. Barley.  Bulgur wheat. [place]. Quinoa. Bran muffins.  Popcorn. Rye wafer crackers. Meats and other proteins Navy beans, kidney beans, and pinto beans. Soybeans. Split peas. Lentils. Nuts and seeds. Dairy Fiber-fortified yogurt. Beverages Fiber-fortified soy milk. Fiber-fortified orange juice. Other foods Fiber bars. The items listed above may not be a complete list of recommended foods and beverages. Contact a dietitian for more information. What foods should I avoid? Fruits Fruit juice. Cooked, strained fruit. Vegetables Fried potatoes. Canned vegetables. Well-cooked vegetables. Grains White bread. Pasta made with refined flour. White rice. Meats and other proteins Fatty cuts of meat. Fried chicken or fried fish. Dairy Milk. Yogurt. Cream cheese. Sour cream. Fats and oils Butters. Beverages Soft drinks. Other foods Cakes and pastries. The items listed above may not be a complete list of foods and beverages to avoid. Talk with your dietitian about what choices are best for you. Summary Fiber is a type of carbohydrate. It is found in foods such as fruits, vegetables, whole grains, and beans. A high-fiber diet has many benefits. It can help to prevent constipation, lower blood cholesterol, aid weight loss, and reduce your risk of heart disease, diabetes, and certain cancers. Increase your intake of fiber gradually. Increasing fiber too quickly may cause cramping, bloating, and gas. Drink plenty of water while you increase the amount of fiber you consume. The best sources of fiber include whole fruits and vegetables, whole grains, nuts, seeds, and beans. This information is not intended to replace advice given to you by your health care provider. Make sure you discuss any questions you have with your health care provider. Document Revised: 09/07/2019 Document Reviewed: 09/07/2019 Elsevier Patient Education  2022 [employer].

## 2021-06-27 LAB — CBC WITH DIFFERENTIAL/PLATELET
Absolute Monocytes: 508 cells/uL (ref 200–950)
Basophils Absolute: 69 cells/uL (ref 0–200)
Basophils Relative: 0.9 %
Eosinophils Absolute: 123 cells/uL (ref 15–500)
Eosinophils Relative: 1.6 %
HCT: 40.6 % (ref 35.0–45.0)
Hemoglobin: 13.4 g/dL (ref 11.7–15.5)
Lymphs Abs: 2764 cells/uL (ref 850–3900)
MCH: 28.8 pg (ref 27.0–33.0)
MCHC: 33 g/dL (ref 32.0–36.0)
MCV: 87.1 fL (ref 80.0–100.0)
MPV: 9.7 fL (ref 7.5–12.5)
Monocytes Relative: 6.6 %
Neutro Abs: 4235 cells/uL (ref 1500–7800)
Neutrophils Relative %: 55 %
Platelets: 397 10*3/uL (ref 140–400)
RBC: 4.66 10*6/uL (ref 3.80–5.10)
RDW: 13 % (ref 11.0–15.0)
Total Lymphocyte: 35.9 %
WBC: 7.7 10*3/uL (ref 3.8–10.8)

## 2021-06-27 LAB — COMPLETE METABOLIC PANEL WITH GFR
AG Ratio: 1.4 (calc) (ref 1.0–2.5)
ALT: 16 U/L (ref 6–29)
AST: 15 U/L (ref 10–35)
Albumin: 4.2 g/dL (ref 3.6–5.1)
Alkaline phosphatase (APISO): 72 U/L (ref 37–153)
BUN: 16 mg/dL (ref 7–25)
CO2: 25 mmol/L (ref 20–32)
Calcium: 9.6 mg/dL (ref 8.6–10.4)
Chloride: 106 mmol/L (ref 98–110)
Creat: 0.83 mg/dL (ref 0.50–1.05)
Globulin: 3 g/dL (calc) (ref 1.9–3.7)
Glucose, Bld: 91 mg/dL (ref 65–99)
Potassium: 4.6 mmol/L (ref 3.5–5.3)
Sodium: 140 mmol/L (ref 135–146)
Total Bilirubin: 0.4 mg/dL (ref 0.2–1.2)
Total Protein: 7.2 g/dL (ref 6.1–8.1)
eGFR: 78 mL/min/{1.73_m2} (ref >=60)

## 2021-06-27 LAB — LIPID PANEL
Cholesterol: 219 mg/dL — ABNORMAL HIGH (ref <200)
HDL: 50 mg/dL (ref >=50)
LDL Cholesterol (Calc): 134 mg/dL (calc) — ABNORMAL HIGH
Non-HDL Cholesterol (Calc): 169 mg/dL (calc) — ABNORMAL HIGH (ref <130)
Total CHOL/HDL Ratio: 4.4 (calc) (ref <5.0)
Triglycerides: 213 mg/dL — ABNORMAL HIGH (ref <150)

## 2021-06-27 LAB — MAGNESIUM: Magnesium: 2.2 mg/dL (ref 1.5–2.5)

## 2021-06-27 LAB — TSH: TSH: 1.58 mIU/L (ref 0.40–4.50)

## 2021-07-01 NOTE — Patient Instructions (Addendum)
1. Seasonal and perennial allergic rhinitis (skin test positive AS:TMHDQQI, ragweed, trees, indoor molds, outdoor molds, cat, and dog) - Continue taking: Your over-the-counter antihistamine once a day as needed for any. ( Carbinoxamine was too expensive$68.99, Allegra did not work and caused really bad headaches, levocetirizine and Singulair caused headaches, and loratadine did not help at all) -Contact your ear nose and throat doctor to get their perspective as far as if they feel sinus Navage would be okay to use - You can use an extra dose of the antihistamine, if needed, for breakthrough symptoms.  - Continue saline nasal spray 1-2 times daily to remove allergens from the nasal cavities as well as help with mucous clearance (this is especially helpful to do before the nasal sprays are given) - Continue allergy injections per protocol and have access to your epinephrine auto injector device  2.  Shortness of breath/cough May use albuterol using 2 puffs every 4-6 hours as needed for cough, wheeze, tightness in chest, or shortness of breath to see if this helps with your shortness of breath and cough.   Follow up in 4-6 months or sooner if needed    Reducing Pollen Exposure  The American Academy of Allergy, Asthma and Immunology suggests the following steps to reduce your exposure to pollen during allergy seasons.    Do not hang sheets or clothing out to dry; pollen may collect on these items. Do not mow lawns or spend time around freshly cut grass; mowing stirs up pollen. Keep windows closed at night.  Keep car windows closed while driving. Minimize morning activities outdoors, a time when pollen counts are usually at their highest. Stay indoors as much as possible when pollen counts or humidity is high and on windy days when pollen tends to remain in the air longer. Use air conditioning when possible.  Many air conditioners have filters that trap the pollen spores. Use a HEPA room air  filter to remove pollen form the indoor air you breathe.  Control of Mold Allergen   Mold and fungi can grow on a variety of surfaces provided certain temperature and moisture conditions exist.  Outdoor molds grow on plants, decaying vegetation and soil.  The major outdoor mold, Alternaria and Cladosporium, are found in very high numbers during hot and dry conditions.  Generally, a late Summer - Fall peak is seen for common outdoor fungal spores.  Rain will temporarily lower outdoor mold spore count, but counts rise rapidly when the rainy period ends.  The most important indoor molds are Aspergillus and Penicillium.  Dark, humid and poorly ventilated basements are ideal sites for mold growth.  The next most common sites of mold growth are the bathroom and the kitchen.  Outdoor (Seasonal) Mold Control  Positive outdoor molds via skin testing: Bipolaris (Helminthsporium), Drechslera (Curvalaria), and Mucor  Use air conditioning and keep windows closed Avoid exposure to decaying vegetation. Avoid leaf raking. Avoid grain handling. Consider wearing a face mask if working in moldy areas.    Indoor (Perennial) Mold Control   Positive indoor molds via skin testing: Fusarium, Aureobasidium ([place]), and Rhizopus  Maintain humidity below 50%. Clean washable surfaces with 5% bleach solution. Remove sources e.g. contaminated carpets.    Control of Dog or Cat Allergen  Avoidance is the best way to manage a dog or cat allergy. If you have a dog or cat and are allergic to dog or cats, consider removing the dog or cat from the home. If you have a dog  or cat but dont want to find it a new home, or if your family wants a pet even though someone in the household is allergic, here are some strategies that may help keep symptoms at bay:  Keep the pet out of your bedroom and restrict it to only a few rooms. Be advised that keeping the dog or cat in only one room will not limit the allergens to that  room. Dont pet, hug or kiss the dog or cat; if you do, wash your hands with soap and water. High-efficiency particulate air (HEPA) cleaners run continuously in a bedroom or living room can reduce allergen levels over time. Regular use of a high-efficiency vacuum cleaner or a central vacuum can reduce allergen levels. Giving your dog or cat a bath at least once a week can reduce airborne allergen.

## 2021-07-02 ENCOUNTER — Other Ambulatory Visit: Payer: Self-pay

## 2021-07-02 ENCOUNTER — Ambulatory Visit: Payer: PPO | Admitting: Family

## 2021-07-02 ENCOUNTER — Encounter: Payer: Self-pay | Admitting: Family

## 2021-07-02 ENCOUNTER — Ambulatory Visit: Payer: Self-pay | Admitting: *Deleted

## 2021-07-02 VITALS — BP 128/72 | HR 82 | Temp 97.7°F | Resp 16 | Ht 65.0 in | Wt 208.0 lb

## 2021-07-02 DIAGNOSIS — R059 Cough, unspecified: Secondary | ICD-10-CM | POA: Diagnosis not present

## 2021-07-02 DIAGNOSIS — G500 Trigeminal neuralgia: Secondary | ICD-10-CM

## 2021-07-02 DIAGNOSIS — J302 Other seasonal allergic rhinitis: Secondary | ICD-10-CM

## 2021-07-02 DIAGNOSIS — J309 Allergic rhinitis, unspecified: Secondary | ICD-10-CM

## 2021-07-02 DIAGNOSIS — M950 Acquired deformity of nose: Secondary | ICD-10-CM

## 2021-07-02 NOTE — Progress Notes (Signed)
[address] [place] [address] Dept: [phone]  FOLLOW UP NOTE  Patient ID: Margaret Gilmore, female    DOB: [birth year]  Age: 66 y.o. MRN: [id number] Date of Office Visit: 07/02/2021  Assessment  Chief Complaint: Seasonal and perennial Allergic Rhinitis (3 mth f/u - )  HPI Margaret Gilmore is a 66 year old female who presents today for follow-up of seasonal and perennial allergic rhinitis, acquired nasal deformity, trigeminal neuralgia, and dyspnea/cough.  She was last seen on April 02, 2021 by [name], FNP.  Since her last office visit she reports no new diagnosis or surgeries.  She does mention that she does not have a surgery date yet for her nose. It will be a year in July or August that she is spoken with her therapist and then she will go back to see the doctor to see if they approve the surgery.  Seasonal and perennial allergic rhinitis is reported as not well controlled with an over-the-counter [facility] or [facility] brand antihistamine.  She reports that levocetirizine and Singulair both cause headaches.  She has tried loratadine in the past and it did not help at all.  When she tried Zyrtec she is not sure if it helped or caused a headache.  Carbinoxamine was $68.99 out-of-pocket and Allegra did not work and caused really bad headaches.  She continues to receive allergy injections per protocol and has access to an EpiPen.  She does feel like the allergy injections have helped decrease her headaches.  She also mentions that her primary care physician has given her samples of Nurtec and this has helped with the headaches and she has not had headaches in several weeks.  She reports clear rhinorrhea, nasal congestion, and postnasal drip that she feels causes her to cough.  What she coughs up is white in color.  She also mentions that her voice goes up and down and that it does this when her allergies are acting up.  She mentions that 3 allergy injections ago her  daughter noticed that she got red on her neck and that it lasted for maybe 10 to 30 minutes.  The area did not itch.  She denies any concomitant cardiorespiratory and gastrointestinal symptoms.  Her daughter noticed the redness approximately an hour after getting her allergy injection.  She has not had any problems with her allergy injections since.  Instructed her to let the injection room nurse know if she has any other symptoms with her injections.  She has used Flonase in the past everyday but stopped using it because it was a steroid.  She also mentions that her ear nose and throat doctor has her use a cream in her right nostril region and she tries not to pick at it when it gets dry.   Drug Allergies:  Allergies  Allergen Reactions   Pregabalin Swelling   Diphenhydramine Nausea And Vomiting    Severe headaches  Severe headaches  Severe headaches    Nitrous Oxide Other (See Comments)    Pt has gas bubble in right eye- will cause blindness per pt  Pt has gas bubble in right eye- will cause blindness per pt  Pt has gas bubble in right eye- will cause blindness per pt    Sulfa Antibiotics Other (See Comments)    Headache    Review of Systems: Review of Systems  Constitutional:  Negative for chills and fever.  HENT:         Reports clear rhinorrhea, nasal  congestion, and postnasal drip that is white when she coughs up  Eyes:        Reports that her right eye feels matted at times and she has eyedrops from her eye doctor that they recommend to use due to her multiple ophthalmic issues including a macular hole as well as retinal detachments  Respiratory:  Positive for cough. Negative for shortness of breath and wheezing.        Reports cough that she feels is due to postnasal drip.  Cough can be productive with white sputum.  Denies wheezing, tightness in chest, and shortness of breath.  Cardiovascular:  Negative for chest pain and palpitations.  Gastrointestinal:        Reports that  Nexium usually controls her heartburn/reflux symptoms unless she eats greasy foods.  Genitourinary:  Negative for frequency.  Skin:  Negative for itching and rash.  Neurological:  Positive for headaches.       Feels that headaches have been better since starting allergy injections  Endo/Heme/Allergies:  Positive for environmental allergies.    Physical Exam: BP 128/72    Pulse 82    Temp 97.7 F (36.5 C)    Resp 16    Ht 5\' 5"  (1.651 m)    Wt 208 lb (94.3 kg)    SpO2 96%    BMI 34.61 kg/m    Physical Exam Constitutional:      Appearance: Normal appearance.  HENT:     Head: Normocephalic and atraumatic.     Comments: Nasal deformity present.  Scabbing noted in right nostril region.  No oozing or bleeding.  Turbinates with minimal mucosal edema and no rhinorrhea present    Right Ear: Ear canal and external ear normal.     Left Ear: Tympanic membrane, ear canal and external ear normal.     Mouth/Throat:     Mouth: Mucous membranes are moist.     Pharynx: Oropharynx is clear.  Eyes:     Conjunctiva/sclera: Conjunctivae normal.  Cardiovascular:     Rate and Rhythm: Regular rhythm.     Heart sounds: Normal heart sounds.  Pulmonary:     Effort: Pulmonary effort is normal.     Breath sounds: Normal breath sounds.     Comments: Lungs clear to auscultation Musculoskeletal:     Cervical back: Neck supple.  Skin:    General: Skin is warm.  Neurological:     Mental Status: She is alert and oriented to person, place, and time.  Psychiatric:        Mood and Affect: Mood normal.        Behavior: Behavior normal.        Thought Content: Thought content normal.        Judgment: Judgment normal.    Diagnostics: None  Assessment and Plan: 1. Seasonal and perennial allergic rhinitis   2. Cough, unspecified type   3. Nasal deformity, acquired   4. Trigeminal neuralgia     No orders of the defined types were placed in this encounter.   Patient Instructions  1. Seasonal and  perennial allergic rhinitis (skin test positive [id number], ragweed, trees, indoor molds, outdoor molds, cat, and dog) - Continue taking: Your over-the-counter antihistamine once a day as needed for any. ( Carbinoxamine was too expensive$68.99, Allegra did not work and caused really bad headaches, levocetirizine and Singulair caused headaches, and loratadine did not help at all) -Contact your ear nose and throat doctor to get their perspective as far as if they  feel sinus Navage would be okay to use - You can use an extra dose of the antihistamine, if needed, for breakthrough symptoms.  - Continue saline nasal spray 1-2 times daily to remove allergens from the nasal cavities as well as help with mucous clearance (this is especially helpful to do before the nasal sprays are given) - Continue allergy injections per protocol and have access to your epinephrine auto injector device  2.  Shortness of breath/cough May use albuterol using 2 puffs every 4-6 hours as needed for cough, wheeze, tightness in chest, or shortness of breath to see if this helps with your shortness of breath and cough.   Follow up in 4-6 months or sooner if needed    Reducing Pollen Exposure  The American Academy of Allergy, Asthma and Immunology suggests the following steps to reduce your exposure to pollen during allergy seasons.    Do not hang sheets or clothing out to dry; pollen may collect on these items. Do not mow lawns or spend time around freshly cut grass; mowing stirs up pollen. Keep windows closed at night.  Keep car windows closed while driving. Minimize morning activities outdoors, a time when pollen counts are usually at their highest. Stay indoors as much as possible when pollen counts or humidity is high and on windy days when pollen tends to remain in the air longer. Use air conditioning when possible.  Many air conditioners have filters that trap the pollen spores. Use a HEPA room air filter to remove  pollen form the indoor air you breathe.  Control of Mold Allergen   Mold and fungi can grow on a variety of surfaces provided certain temperature and moisture conditions exist.  Outdoor molds grow on plants, decaying vegetation and soil.  The major outdoor mold, Alternaria and Cladosporium, are found in very high numbers during hot and dry conditions.  Generally, a late Summer - Fall peak is seen for common outdoor fungal spores.  Rain will temporarily lower outdoor mold spore count, but counts rise rapidly when the rainy period ends.  The most important indoor molds are Aspergillus and Penicillium.  Dark, humid and poorly ventilated basements are ideal sites for mold growth.  The next most common sites of mold growth are the bathroom and the kitchen.  Outdoor (Seasonal) Mold Control  Positive outdoor molds via skin testing: Bipolaris (Helminthsporium), Drechslera (Curvalaria), and Mucor  Use air conditioning and keep windows closed Avoid exposure to decaying vegetation. Avoid leaf raking. Avoid grain handling. Consider wearing a face mask if working in moldy areas.    Indoor (Perennial) Mold Control   Positive indoor molds via skin testing: Fusarium, Aureobasidium (Pullulara), and Rhizopus  Maintain humidity below 50%. Clean washable surfaces with 5% bleach solution. Remove sources e.g. contaminated carpets.    Control of Dog or Cat Allergen  Avoidance is the best way to manage a dog or cat allergy. If you have a dog or cat and are allergic to dog or cats, consider removing the dog or cat from the home. If you have a dog or cat but dont want to find it a new home, or if your family wants a pet even though someone in the household is allergic, here are some strategies that may help keep symptoms at bay:  Keep the pet out of your bedroom and restrict it to only a few rooms. Be advised that keeping the dog or cat in only one room will not limit the allergens to that room. Dont  pet,  hug or kiss the dog or cat; if you do, wash your hands with soap and water. High-efficiency particulate air (HEPA) cleaners run continuously in a bedroom or living room can reduce allergen levels over time. Regular use of a high-efficiency vacuum cleaner or a central vacuum can reduce allergen levels. Giving your dog or cat a bath at least once a week can reduce airborne allergen.        Return in about 4 months (around 10/30/2021), or if symptoms worsen or fail to improve.    Thank you for the opportunity to care for this patient.  Please do not hesitate to contact me with questions.  [name], FNP [facility]

## 2021-07-03 DIAGNOSIS — F419 Anxiety disorder, unspecified: Secondary | ICD-10-CM | POA: Diagnosis not present

## 2021-07-03 DIAGNOSIS — F4320 Adjustment disorder, unspecified: Secondary | ICD-10-CM | POA: Diagnosis not present

## 2021-07-09 ENCOUNTER — Ambulatory Visit (INDEPENDENT_AMBULATORY_CARE_PROVIDER_SITE_OTHER): Payer: PPO

## 2021-07-09 DIAGNOSIS — J309 Allergic rhinitis, unspecified: Secondary | ICD-10-CM

## 2021-07-10 ENCOUNTER — Ambulatory Visit: Payer: PPO | Admitting: Adult Health

## 2021-07-14 DIAGNOSIS — F4320 Adjustment disorder, unspecified: Secondary | ICD-10-CM | POA: Diagnosis not present

## 2021-07-14 DIAGNOSIS — F419 Anxiety disorder, unspecified: Secondary | ICD-10-CM | POA: Diagnosis not present

## 2021-07-16 ENCOUNTER — Ambulatory Visit (INDEPENDENT_AMBULATORY_CARE_PROVIDER_SITE_OTHER): Payer: PPO

## 2021-07-16 DIAGNOSIS — J309 Allergic rhinitis, unspecified: Secondary | ICD-10-CM

## 2021-07-21 DIAGNOSIS — F419 Anxiety disorder, unspecified: Secondary | ICD-10-CM | POA: Diagnosis not present

## 2021-07-21 DIAGNOSIS — F4320 Adjustment disorder, unspecified: Secondary | ICD-10-CM | POA: Diagnosis not present

## 2021-07-25 ENCOUNTER — Ambulatory Visit (INDEPENDENT_AMBULATORY_CARE_PROVIDER_SITE_OTHER): Payer: PPO | Admitting: *Deleted

## 2021-07-25 DIAGNOSIS — J309 Allergic rhinitis, unspecified: Secondary | ICD-10-CM | POA: Diagnosis not present

## 2021-07-28 DIAGNOSIS — F4320 Adjustment disorder, unspecified: Secondary | ICD-10-CM | POA: Diagnosis not present

## 2021-07-28 DIAGNOSIS — F419 Anxiety disorder, unspecified: Secondary | ICD-10-CM | POA: Diagnosis not present

## 2021-08-01 ENCOUNTER — Ambulatory Visit (INDEPENDENT_AMBULATORY_CARE_PROVIDER_SITE_OTHER): Payer: PPO

## 2021-08-01 DIAGNOSIS — J309 Allergic rhinitis, unspecified: Secondary | ICD-10-CM

## 2021-08-11 DIAGNOSIS — F4320 Adjustment disorder, unspecified: Secondary | ICD-10-CM | POA: Diagnosis not present

## 2021-08-11 DIAGNOSIS — F419 Anxiety disorder, unspecified: Secondary | ICD-10-CM | POA: Diagnosis not present

## 2021-08-15 ENCOUNTER — Ambulatory Visit (INDEPENDENT_AMBULATORY_CARE_PROVIDER_SITE_OTHER): Payer: PPO

## 2021-08-15 DIAGNOSIS — J309 Allergic rhinitis, unspecified: Secondary | ICD-10-CM | POA: Diagnosis not present

## 2021-08-22 DIAGNOSIS — R29818 Other symptoms and signs involving the nervous system: Secondary | ICD-10-CM | POA: Diagnosis not present

## 2021-08-22 DIAGNOSIS — H02403 Unspecified ptosis of bilateral eyelids: Secondary | ICD-10-CM | POA: Diagnosis not present

## 2021-08-22 DIAGNOSIS — S0993XS Unspecified injury of face, sequela: Secondary | ICD-10-CM | POA: Diagnosis not present

## 2021-08-22 DIAGNOSIS — G508 Other disorders of trigeminal nerve: Secondary | ICD-10-CM | POA: Diagnosis not present

## 2021-08-22 DIAGNOSIS — M952 Other acquired deformity of head: Secondary | ICD-10-CM | POA: Diagnosis not present

## 2021-08-25 ENCOUNTER — Telehealth: Payer: Self-pay | Admitting: Adult Health

## 2021-08-25 NOTE — Telephone Encounter (Signed)
We do not prescribe pain medication at this office.

## 2021-08-25 NOTE — Telephone Encounter (Signed)
Wanting prescription medication for Hernia, her GI recommended otc and after she mentioned that I told her to call her GI provider for recommendation or appointment. She didn't want to do that so I made her an appt with Hinton Dyer next week.  ?

## 2021-08-27 ENCOUNTER — Ambulatory Visit (INDEPENDENT_AMBULATORY_CARE_PROVIDER_SITE_OTHER): Payer: PPO

## 2021-08-27 DIAGNOSIS — J309 Allergic rhinitis, unspecified: Secondary | ICD-10-CM

## 2021-09-01 NOTE — Progress Notes (Signed)
Assessment and Plan: ? ?Jeris was seen today for acute visit. ? ?Diagnoses and all orders for this visit: ? ? ?Gastroesophageal reflux disease with esophagitis without hemorrhage ?Continue to practice dietary modifications ?If change in PPI does not help symptoms should make appointment to be reevaluated by Dr. Fuller Plan ?-     pantoprazole (PROTONIX) 40 MG tablet; Take 1 tablet (40 mg total) by mouth daily. ? ?Fatigue, unspecified type ?If all labs are normal symptoms could be related to sleep apnea, she does not wish to pursue a sleep study at this point. ?-     CBC with Differential/Platelet ?-     TSH ?-     VITAMIN D 25 Hydroxy (Vit-D Deficiency, Fractures) ?-     Vitamin B12 ?-     Iron, Total/Total Iron Binding Cap ? ? Hyperlipidemia ?Currently on Fish oil ?Strongly encouraged to continue diet, exercise and weight loss ? ?Obesity ?Long discussion about weight loss, diet, and exercise ?Recommended diet heavy in fruits and veggies and low in animal meats, cheeses, and dairy products, appropriate calorie intake ?Follow up at next visit ? ? ? ? ?Further disposition pending results of labs. Discussed med's effects and SE's.   ?Over 30 minutes of exam, counseling, chart review, and critical decision making was performed.  ? ?Future Appointments  ?Date Time Provider [place]  ?10/29/2021  9:30 AM [name], FNP [facility] None  ?01/29/2022 10:00 AM [name], NP [facility] None  ?03/11/2022  9:15 AM [name], MD [facility] None  ?07/21/2022 11:00 AM [name], NP [facility] None  ? ? ?------------------------------------------------------------------------------------------------------------------ ? ? ?HPI ?BP 128/74   Pulse 79   Temp 97.7 ?F (36.5 ?C)   Wt 206 lb 12.8 oz (93.8 kg)   SpO2 98%   BMI 34.41 kg/m?  ? ? ?66 y.o.female presents for "feeling of food sitting in her throat". She was evaluated by Dr. Fuller Plan for similar symptoms in 10/2017- esophagram shows mild esophageal  narrowing, CT of neck 12/10/17- unremarkable. She has had 2 previous dilations. She is currently doing Nexium 40 mg daily. She cannot lie flat and sleeps with head of bed raised. She is able to drink fluids without difficulty, avoids carbonated drinks. ? ?She feels like she has no energy  Doing an exercise class called [employer] daily.  Feels very fatigued after. She does snore at night.  She awakens feeling very tired and can fall asleep easily throughout the day.   ? ?She is having reconstruction of face in 2024. She completed therapy x 1 year for OCD.  She continues to see a therapist.  ? ?BP are running in normal range and currently does not require medication ?BP Readings from Last 3 Encounters:  ?09/02/21 128/74  ?07/02/21 128/72  ?06/26/21 124/74  ? ?BMI is Body mass index is 34.41 kg/m?., she has been working on diet and exercise. ?Wt Readings from Last 3 Encounters:  ?09/02/21 206 lb 12.8 oz (93.8 kg)  ?07/02/21 208 lb (94.3 kg)  ?06/26/21 208 lb (94.3 kg)  ? ? ? ?She is currently on Omega 3 fish oil for cholesterol, cholesterol is not at goal ?Lab Results  ?Component Value Date  ? CHOL 219 (H) 06/26/2021  ? HDL 50 06/26/2021  ? LDLCALC 134 (H) 06/26/2021  ? TRIG 213 (H) 06/26/2021  ? CHOLHDL 4.4 06/26/2021  ?  ? ?Past Medical History:  ?Diagnosis Date  ? Anxiety   ? situational  ? Arthritis   ? B12 deficiency   ?  hx of , no longer taking medication  ? Cataracts, bilateral   ? Constipation   ? Depression   ? situational  ? Esophageal stricture   ? Family history of adverse reaction to anesthesia   ? mom had problems waking up  ? Fatty liver   ? GERD (gastroesophageal reflux disease)   ? Headache   ? migraines   ? Macular hole of left eye   ? Retinal detachment with multiple breaks, right 07/27/2017  ? Trigeminal neuralgia   ? right side of face is numb  ? Unspecified vitamin D deficiency   ?  ? ?Allergies  ?Allergen Reactions  ? Pregabalin Swelling  ? Diphenhydramine Nausea And Vomiting  ?  Severe  headaches  ?Severe headaches  ?Severe headaches   ? Nitrous Oxide Other (See Comments)  ?  Pt has gas bubble in right eye- will cause blindness per pt  ?Pt has gas bubble in right eye- will cause blindness per pt  ?Pt has gas bubble in right eye- will cause blindness per pt   ? Sulfa Antibiotics Other (See Comments)  ?  Headache  ? ? ?Current Outpatient Medications on File Prior to Visit  ?Medication Sig  ? Ascorbic Acid (VITAMIN C PO) Take by mouth daily.  ? Cholecalciferol (VITAMIN D3) 100000 UNIT/GM POWD Take by mouth.  ? EPINEPHrine 0.3 mg/0.3 mL IJ SOAJ injection Inject 0.3 mg into the muscle as directed.  ? Esomeprazole Magnesium (NEXIUM PO) Take by mouth daily.  ? Magnesium 200 MG CHEW Chew by mouth.  ? Multiple Vitamins-Minerals (MULTIVITAMIN WITH MINERALS) tablet Take 1 tablet by mouth daily.  ? Omega-3 Fatty Acids (FISH OIL) 1200 MG CAPS   ? OVER THE COUNTER MEDICATION Allergy meds  ? Polyethylene Glycol 3350 (MIRALAX PO) Take by mouth.  ? Rimegepant Sulfate (NURTEC) 75 MG TBDP Take 1 tab for migraine; can repeat in 2 hours if needed.  ? vitamin B-12 (CYANOCOBALAMIN) 500 MCG tablet Take 500 mcg by mouth daily.  ? levocetirizine (XYZAL) 5 MG tablet Take 1 tablet (5 mg total) by mouth every evening. (Patient not taking: Reported on 09/02/2021)  ? ?No current facility-administered medications on file prior to visit.  ? ? ?ROS: all negative except above.  ? ?Physical Exam: ? ?BP 128/74   Pulse 79   Temp 97.7 ?F (36.5 ?C)   Wt 206 lb 12.8 oz (93.8 kg)   SpO2 98%   BMI 34.41 kg/m?  ? ?General Appearance: Well nourished, well dressed elder female, in no apparent distress.  ?Eyes: R pupil minimally reactive, left round and reactive, EOMs, conjunctiva no swelling or erythema ?Sinuses: No Frontal/maxillary tenderness  ?ENT/Mouth: Ext aud canals clear, normal light reflex with TMs without erythema, bulging. Good dentition. No erythema, swelling, or exudate on post pharynx. Tonsils not swollen or erythematous.  Hearing normal. R nare chronically deformed/absent; no open wounds or ulcerations; clear crusty discharge.  ?Neck: Supple, thyroid normal. No bruits  ?Respiratory: Respiratory effort normal, BS equal bilaterally without rales, rhonchi, wheezing or stridor.  ?Cardio: RRR without murmurs, rubs or gallops. Brisk peripheral pulses without edema.  ?Chest: symmetric, with normal excursions and percussion.  ?Abdomen: Soft, nontender, no guarding, rebound, hernias, masses, or organomegaly.  ?Lymphatics: Non tender without lymphadenopathy.  ?Musculoskeletal: Full ROM all peripheral extremities,5/5 strength, and normal gait.  ?Skin: Warm, dry without rashes, lesions, ecchymosis. 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 Normal muscle tone, no cerebellar symptoms. Sensation intact.  ?Psych: Awake  and oriented X 3, normal affect, Insight and Judgment appropriate.   ?  ? ?Magda Bernheim, NP ?10:10 AM ?[facility] ? ?

## 2021-09-02 ENCOUNTER — Encounter: Payer: Self-pay | Admitting: Nurse Practitioner

## 2021-09-02 ENCOUNTER — Ambulatory Visit (INDEPENDENT_AMBULATORY_CARE_PROVIDER_SITE_OTHER): Payer: PPO | Admitting: Nurse Practitioner

## 2021-09-02 VITALS — BP 128/74 | HR 79 | Temp 97.7°F | Wt 206.8 lb

## 2021-09-02 DIAGNOSIS — E559 Vitamin D deficiency, unspecified: Secondary | ICD-10-CM

## 2021-09-02 DIAGNOSIS — K2100 Gastro-esophageal reflux disease with esophagitis, without bleeding: Secondary | ICD-10-CM

## 2021-09-02 DIAGNOSIS — D649 Anemia, unspecified: Secondary | ICD-10-CM | POA: Diagnosis not present

## 2021-09-02 DIAGNOSIS — Z130 Encounter for screening for diseases of the blood and blood-forming organs and certain disorders involving the immune mechanism: Secondary | ICD-10-CM

## 2021-09-02 DIAGNOSIS — E669 Obesity, unspecified: Secondary | ICD-10-CM | POA: Diagnosis not present

## 2021-09-02 DIAGNOSIS — E785 Hyperlipidemia, unspecified: Secondary | ICD-10-CM

## 2021-09-02 DIAGNOSIS — E538 Deficiency of other specified B group vitamins: Secondary | ICD-10-CM

## 2021-09-02 DIAGNOSIS — R5383 Other fatigue: Secondary | ICD-10-CM

## 2021-09-02 MED ORDER — PANTOPRAZOLE SODIUM 40 MG PO TBEC: 40.0000 mg | DELAYED_RELEASE_TABLET | Freq: Every day | ORAL | 1 refills | Status: DC

## 2021-09-03 LAB — CBC WITH DIFFERENTIAL/PLATELET
Absolute Monocytes: 432 cells/uL (ref 200–950)
Basophils Absolute: 50 cells/uL (ref 0–200)
Basophils Relative: 0.7 %
Eosinophils Absolute: 101 cells/uL (ref 15–500)
Eosinophils Relative: 1.4 %
HCT: 39.6 % (ref 35.0–45.0)
Hemoglobin: 13 g/dL (ref 11.7–15.5)
Lymphs Abs: 2657 cells/uL (ref 850–3900)
MCH: 28.8 pg (ref 27.0–33.0)
MCHC: 32.8 g/dL (ref 32.0–36.0)
MCV: 87.6 fL (ref 80.0–100.0)
MPV: 10 fL (ref 7.5–12.5)
Monocytes Relative: 6 %
Neutro Abs: 3960 cells/uL (ref 1500–7800)
Neutrophils Relative %: 55 %
Platelets: 374 10*3/uL (ref 140–400)
RBC: 4.52 10*6/uL (ref 3.80–5.10)
RDW: 13.1 % (ref 11.0–15.0)
Total Lymphocyte: 36.9 %
WBC: 7.2 10*3/uL (ref 3.8–10.8)

## 2021-09-03 LAB — VITAMIN B12: Vitamin B-12: >2000 pg/mL — ABNORMAL HIGH (ref 200–1100)

## 2021-09-03 LAB — VITAMIN D 25 HYDROXY (VIT D DEFICIENCY, FRACTURES): Vit D, 25-Hydroxy: 49 ng/mL (ref 30–100)

## 2021-09-03 LAB — IRON, TOTAL/TOTAL IRON BINDING CAP
%SAT: 20 % (calc) (ref 16–45)
Iron: 67 ug/dL (ref 45–160)
TIBC: 336 mcg/dL (calc) (ref 250–450)

## 2021-09-03 LAB — TSH: TSH: 1.75 mIU/L (ref 0.40–4.50)

## 2021-09-05 ENCOUNTER — Ambulatory Visit (INDEPENDENT_AMBULATORY_CARE_PROVIDER_SITE_OTHER): Payer: PPO

## 2021-09-05 DIAGNOSIS — J309 Allergic rhinitis, unspecified: Secondary | ICD-10-CM | POA: Diagnosis not present

## 2021-09-08 ENCOUNTER — Encounter: Payer: Self-pay | Admitting: Nurse Practitioner

## 2021-09-09 ENCOUNTER — Other Ambulatory Visit: Payer: Self-pay | Admitting: Nurse Practitioner

## 2021-09-09 DIAGNOSIS — G4710 Hypersomnia, unspecified: Secondary | ICD-10-CM

## 2021-09-09 DIAGNOSIS — M950 Acquired deformity of nose: Secondary | ICD-10-CM

## 2021-09-09 DIAGNOSIS — R5383 Other fatigue: Secondary | ICD-10-CM

## 2021-09-09 NOTE — Telephone Encounter (Signed)
Wants to do in [place]

## 2021-09-10 ENCOUNTER — Telehealth: Payer: Self-pay | Admitting: Gastroenterology

## 2021-09-10 NOTE — Telephone Encounter (Signed)
Patient called in with complaints of acid reflux unrelieved by her nexium. PCP recently started her on protonix. Patient has not been seen in almost 3 years and understands she will need an appointment. Follow up with Dr. Fuller Plan made for 10/09/21 at 10:50 am. In the meantime, patient has been advised to call PCP for further recommendations.  ?

## 2021-09-10 NOTE — Telephone Encounter (Signed)
Inbound call from patient stating that a few years ago Dr. Fuller Plan had told her to start taking over the counter Nexium. Patient stated it is starting to get too expensive. Patient is seeking advice if he can write a prescription for her. Please advise.  ?

## 2021-09-12 ENCOUNTER — Ambulatory Visit (INDEPENDENT_AMBULATORY_CARE_PROVIDER_SITE_OTHER): Payer: PPO

## 2021-09-12 DIAGNOSIS — J309 Allergic rhinitis, unspecified: Secondary | ICD-10-CM | POA: Diagnosis not present

## 2021-09-17 ENCOUNTER — Ambulatory Visit (INDEPENDENT_AMBULATORY_CARE_PROVIDER_SITE_OTHER): Payer: PPO

## 2021-09-17 DIAGNOSIS — J309 Allergic rhinitis, unspecified: Secondary | ICD-10-CM | POA: Diagnosis not present

## 2021-09-24 ENCOUNTER — Ambulatory Visit (INDEPENDENT_AMBULATORY_CARE_PROVIDER_SITE_OTHER): Payer: PPO

## 2021-09-24 DIAGNOSIS — J309 Allergic rhinitis, unspecified: Secondary | ICD-10-CM | POA: Diagnosis not present

## 2021-10-01 ENCOUNTER — Ambulatory Visit (INDEPENDENT_AMBULATORY_CARE_PROVIDER_SITE_OTHER): Payer: PPO

## 2021-10-01 DIAGNOSIS — J309 Allergic rhinitis, unspecified: Secondary | ICD-10-CM | POA: Diagnosis not present

## 2021-10-09 ENCOUNTER — Encounter: Payer: Self-pay | Admitting: Gastroenterology

## 2021-10-09 ENCOUNTER — Ambulatory Visit: Payer: PPO | Admitting: Gastroenterology

## 2021-10-09 VITALS — BP 116/70 | HR 76 | Ht 64.5 in | Wt 202.4 lb

## 2021-10-09 DIAGNOSIS — K219 Gastro-esophageal reflux disease without esophagitis: Secondary | ICD-10-CM

## 2021-10-09 DIAGNOSIS — R1310 Dysphagia, unspecified: Secondary | ICD-10-CM

## 2021-10-09 DIAGNOSIS — Z1211 Encounter for screening for malignant neoplasm of colon: Secondary | ICD-10-CM

## 2021-10-09 DIAGNOSIS — Z1212 Encounter for screening for malignant neoplasm of rectum: Secondary | ICD-10-CM

## 2021-10-09 MED ORDER — ESOMEPRAZOLE MAGNESIUM 40 MG PO CPDR: 40.0000 mg | DELAYED_RELEASE_CAPSULE | Freq: Two times a day (BID) | ORAL | 11 refills | Status: DC

## 2021-10-09 NOTE — Patient Instructions (Signed)
We have sent the following medications to your pharmacy for you to pick up at your convenience: Nexium.  You have been scheduled for an endoscopy. Please follow written instructions given to you at your visit today. If you use inhalers (even only as needed), please bring them with you on the day of your procedure.  The [facility] providers would like to encourage you to use [facility] to communicate with providers for non-urgent requests or questions.  Due to long hold times on the telephone, sending your provider a message by [facility] may be a faster and more efficient way to get a response.  Please allow 48 business hours for a response.  Please remember that this is for non-urgent requests.   Due to recent changes in healthcare laws, you may see the results of your imaging and laboratory studies on MyChart before your provider has had a chance to review them.  We understand that in some cases there may be results that are confusing or concerning to you. Not all laboratory results come back in the same time frame and the provider may be waiting for multiple results in order to interpret others.  Please give [place] 48 hours in order for your provider to thoroughly review all the results before contacting the office for clarification of your results.   Thank you for choosing me and [facility].  Pricilla Riffle. Dagoberto Ligas., MD., [facility] Regal

## 2021-10-09 NOTE — Progress Notes (Signed)
Assessment    GERD, dysphagia - suspected recurrent esophageal stricture Hoarseness, possible LPR CRC screening, average risk  Recommendations   Increase Nexium to 40 mg po bid and follow antireflux measures Schedule EGD with possible dilation. The risks (including bleeding, perforation, infection, missed lesions, medication reactions and possible hospitalization or surgery if complications occur), benefits, and alternatives to endoscopy with possible biopsy and possible dilation were discussed with the patient and they consent to proceed.   Colonoscopy April 2025    HPI   Chief complaint: GERD  Patient profile:  ARTIS BUECHELE is a 66 y.o. female referred by Unk Pinto, MD for GERD and dysphagia.  She relates progressively worsening problems with solid food dysphagia over the past few months.  She has had intermittent hoarseness.  The hoarseness varies day-to-day and appears to correlate with her significant allergy symptoms.  She has a history of an esophageal stricture that was dilated in April 2019.  She has been maintained on Nexium 40 mg daily.  Denies weight loss, abdominal pain, constipation, diarrhea, change in stool caliber, melena, hematochezia, nausea, vomiting, chest pain.    Previous Labs / Imaging::    Latest Ref Rng & Units 09/02/2021   10:44 AM 06/26/2021   11:33 AM 01/29/2021   11:09 AM  CBC  WBC 3.8 - 10.8 Thousand/uL 7.2   7.7   7.8    Hemoglobin 11.7 - 15.5 g/dL 13.0   13.4   13.0    Hematocrit 35.0 - 45.0 % 39.6   40.6   40.5    Platelets 140 - 400 Thousand/uL 374   397   340      No results found for: LIPASE    Latest Ref Rng & Units 06/26/2021   11:33 AM 01/29/2021   11:09 AM 07/10/2020   11:17 AM  CMP  Glucose 65 - 99 mg/dL 91   72   87    BUN 7 - 25 mg/dL '16   19   23    '$ Creatinine 0.50 - 1.05 mg/dL 0.83   0.82   0.78    Sodium 135 - 146 mmol/L 140   141   139    Potassium 3.5 - 5.3 mmol/L 4.6   4.0   4.3    Chloride 98 - 110 mmol/L 106    107   104    CO2 20 - 32 mmol/L '25   27   25    '$ Calcium 8.6 - 10.4 mg/dL 9.6   9.3   9.8    Total Protein 6.1 - 8.1 g/dL 7.2   7.0   7.1    Total Bilirubin 0.2 - 1.2 mg/dL 0.4   0.4   0.4    AST 10 - 35 U/L '15   12   12    '$ ALT 6 - 29 U/L '16   8   11       '$ Previous GI evaluation    Endoscopies:  EGD April 2019 - Benign-appearing esophageal stenosis. Dilated. - Small hiatal hernia. - Normal duodenal bulb and second portion of the duodenum. - No specimens collected.  Imaging:     Past Medical History:  Diagnosis Date   Anxiety    situational   Arthritis    B12 deficiency    hx of , no longer taking medication   Cataracts, bilateral    Constipation    Depression    situational   Esophageal stricture    Family history  of adverse reaction to anesthesia    mom had problems waking up   Fatty liver    GERD (gastroesophageal reflux disease)    Headache    migraines    Macular hole of left eye    Retinal detachment with multiple breaks, right 07/27/2017   Trigeminal neuralgia    right side of face is numb   Unspecified vitamin D deficiency    Past Surgical History:  Procedure Laterality Date   25 GAUGE PARS PLANA VITRECTOMY WITH 20 GAUGE MVR PORT Left 11/09/2017   25 GAUGE PARS PLANA VITRECTOMY WITH 20 GAUGE MVR PORT Left 11/09/2017   Procedure: 25 GAUGE PARS PLANA VITRECTOMY WITH 20 GAUGE MVR PORT;  Surgeon: [name], MD;  Location: [place];  Service: Ophthalmology;  Laterality: Left;   25 GAUGE PARS PLANA VITRECTOMY WITH 20 GAUGE MVR PORT FOR MACULAR HOLE Left 03/05/2015   Procedure: 25 GAUGE PARS PLANA VITRECTOMY WITH 20 GAUGE MVR PORT FOR MACULAR HOLE;  Surgeon: [name], MD;  Location: [place];  Service: Ophthalmology;  Laterality: Left;   25 GAUGE PARS PLANA VITRECTOMY WITH 20 GAUGE MVR PORT FOR MACULAR HOLE Right 06/22/2017   25 GAUGE PARS PLANA VITRECTOMY WITH 20 GAUGE MVR PORT FOR MACULAR HOLE Right 06/22/2017   Procedure: 25 GAUGE PARS PLANA VITRECTOMY  WITH 20 GAUGE MVR PORT FOR MACULAR HOLE;  Surgeon: [name], MD;  Location: [place];  Service: Ophthalmology;  Laterality: Right;   ABDOMINAL HYSTERECTOMY     Total   APPENDECTOMY     CESAREAN SECTION     COLONOSCOPY     ESOPHAGOGASTRODUODENOSCOPY (EGD) WITH ESOPHAGEAL DILATION     EYE SURGERY     GAS/FLUID EXCHANGE Left 03/05/2015   Procedure: GAS/FLUID EXCHANGE;  Surgeon: [name], MD;  Location: [place];  Service: Ophthalmology;  Laterality: Left;  [id number]   GAS/FLUID EXCHANGE Right 06/22/2017   Procedure: GAS/FLUID EXCHANGE;  Surgeon: [name], MD;  Location: [place];  Service: Ophthalmology;  Laterality: Right;   GAS/FLUID EXCHANGE Right 07/27/2017   Procedure: GAS/FLUID EXCHANGE;  Surgeon: [name], MD;  Location: [place];  Service: Ophthalmology;  Laterality: Right;   GAS/FLUID EXCHANGE Left 11/09/2017   Procedure: GAS/FLUID EXCHANGE;  Surgeon: [name], MD;  Location: [place];  Service: Ophthalmology;  Laterality: Left;   knot removed from right side of abdomen     LASER PHOTO ABLATION Left 03/05/2015   Procedure: LASER PHOTO ABLATION;  Surgeon: [name], MD;  Location: [place];  Service: Ophthalmology;  Laterality: Left;  Headscope Laser   LASER PHOTO ABLATION Right 06/22/2017   Procedure: LASER PHOTO ABLATION;  Surgeon: [name], MD;  Location: [place];  Service: Ophthalmology;  Laterality: Right;   LASER PHOTO ABLATION Right 07/27/2017   Procedure: LASER PHOTO ABLATION;  Surgeon: [name], MD;  Location: [place];  Service: Ophthalmology;  Laterality: Right;   LASER PHOTO ABLATION Left 11/09/2017   Procedure: LASER PHOTO ABLATION;  Surgeon: [name], MD;  Location: [place];  Service: Ophthalmology;  Laterality: Left;   MEMBRANE PEEL Left 03/05/2015   Procedure: MEMBRANE PEEL;  Surgeon: [name], MD;  Location: [place];  Service: Ophthalmology;  Laterality: Left;   MEMBRANE PEEL Right 06/22/2017   Procedure: MEMBRANE PEEL;  Surgeon:  [name], MD;  Location: [place];  Service: Ophthalmology;  Laterality: Right;   MEMBRANE PEEL Right 07/27/2017   Procedure: MEMBRANE PEEL;  Surgeon:  [name], MD;  Location: [place];  Service: Ophthalmology;  Laterality: Right;   MEMBRANE PEEL Left 11/09/2017   Procedure: MEMBRANE PEEL;  Surgeon: [name], MD;  Location: [place];  Service: Ophthalmology;  Laterality: Left;   SCLERAL BUCKLE WITH POSSIBLE 25 GAUGE PARS PLANA VITRECTOMY Right 07/27/2017   Procedure: SCLERAL BUCKLE  25 GAUGE PARS PLANA VITRECTOMY, C3F8 INJECTION REPAIR OF COMPLEX TRACTION OF RETINAL DETACHMENT;  Surgeon: [name], MD;  Location: [place];  Service: Ophthalmology;  Laterality: Right;   SERUM PATCH Left 03/05/2015   Procedure: SERUM PATCH;  Surgeon: [name], MD;  Location: [place];  Service: Ophthalmology;  Laterality: Left;   SERUM PATCH Right 06/22/2017   Procedure: SERUM PATCH;  Surgeon: [name], MD;  Location: [place];  Service: Ophthalmology;  Laterality: Right;   SERUM PATCH Left 11/09/2017   Procedure: SERUM PATCH;  Surgeon: [name], MD;  Location: [place];  Service: Ophthalmology;  Laterality: Left;   trigeminal      Family History  Problem Relation Age of Onset   Diabetes Mother    COPD Mother    Hyperlipidemia Mother    Pancreatic cancer Father 15   Hyperlipidemia Father    Heart disease Father    Hypertension Father    Hypertension Brother    Alzheimer's disease Maternal Grandmother    Bone cancer Maternal Grandfather        metz from prostate   Prostate cancer Maternal Grandfather    Breast cancer Maternal Aunt        BREAST   Alzheimer's disease Maternal Aunt    Hodgkin's lymphoma Paternal Aunt        HODGKINS   Colon cancer Paternal Uncle    Cancer Paternal Uncle        4 different kinds, can't recall what   Social History   Tobacco Use   Smoking status: Never   Smokeless tobacco: Never  Vaping Use   Vaping Use: Never used  Substance Use  Topics   Alcohol use: No   Drug use: No   Current Outpatient Medications  Medication Sig Dispense Refill   Ascorbic Acid (VITAMIN C PO) Take 1 tablet by mouth daily.     Cholecalciferol (VITAMIN D3) 25 MCG (1000 UT) CAPS Take 1 capsule by mouth daily.     Esomeprazole Magnesium (NEXIUM 24HR) 20 MG TBEC Take 2 tablets by mouth daily.     Magnesium Gluconate 550 MG TABS Take 1 tablet by mouth daily.     Multiple Vitamins-Minerals (MULTIVITAMIN WITH MINERALS) tablet Take 1 tablet by mouth daily.     Omega-3 Fatty Acids (FISH OIL) 1200 MG CAPS Take 2 capsules by mouth daily.     OVER THE COUNTER MEDICATION Take 1 tablet by mouth daily. Allergy meds     Polyethylene Glycol 3350 (MIRALAX PO) Take 17 g by mouth as needed.     Rimegepant Sulfate (NURTEC) 75 MG TBDP Take 1 tab for migraine; can repeat in 2 hours if needed. 2 tablet 0   vitamin B-12 (CYANOCOBALAMIN) 500 MCG tablet Take 500 mcg by mouth daily.     EPINEPHrine 0.3 mg/0.3 mL IJ SOAJ injection Inject 0.3 mg into the muscle as directed. (Patient not taking: Reported on 10/09/2021)     No current facility-administered medications for this visit.   Allergies  Allergen Reactions   Pregabalin Swelling   Diphenhydramine Nausea And Vomiting    Severe headaches  Severe headaches  Severe headaches    Nitrous Oxide Other (See Comments)    Pt has gas bubble in right eye- will cause blindness per pt  Pt has gas bubble in right eye- will cause blindness per pt  Pt has gas bubble in right eye- will cause blindness per pt    Sulfa Antibiotics Other (See Comments)    Headache    Review of Systems: All other systems reviewed and negative except where noted in HPI.    Physical Exam    Wt Readings from Last 3 Encounters:  10/09/21 202 lb 6 oz (91.8 kg)  09/02/21 206 lb 12.8 oz (93.8 kg)  07/02/21 208 lb (94.3 kg)    BP 116/70 (BP Location: Left Arm, Patient Position: Sitting, Cuff Size: Normal)   Pulse 76   Ht 5' 4.5" (1.638 m)  Comment: height measured without shoes  Wt 202 lb 6 oz (91.8 kg)   BMI 34.20 kg/m  Constitutional:  Generally well appearing female in no acute distress. Psychiatric: Pleasant. Normal mood and affect. Behavior is normal. EENT: Pupils normal.  Conjunctivae are normal. No scleral icterus. Neck supple.  Cardiovascular: Normal rate, regular rhythm. No edema Pulmonary/chest: Effort normal and breath sounds normal. No wheezing, rales or rhonchi. Abdominal: Soft, nondistended, nontender. Bowel sounds active throughout. There are no masses palpable. No hepatomegaly. Rectal: Not done Neurological: Alert and oriented to person place and time. Skin: Skin is warm and dry. No rashes noted.  Lucio Edward, MD   cc:  Referring Provider Unk Pinto, MD

## 2021-10-10 ENCOUNTER — Ambulatory Visit (INDEPENDENT_AMBULATORY_CARE_PROVIDER_SITE_OTHER): Payer: PPO

## 2021-10-10 ENCOUNTER — Encounter: Payer: Self-pay | Admitting: Adult Health

## 2021-10-10 DIAGNOSIS — J309 Allergic rhinitis, unspecified: Secondary | ICD-10-CM

## 2021-10-17 ENCOUNTER — Ambulatory Visit (INDEPENDENT_AMBULATORY_CARE_PROVIDER_SITE_OTHER): Payer: PPO

## 2021-10-17 DIAGNOSIS — J309 Allergic rhinitis, unspecified: Secondary | ICD-10-CM

## 2021-10-22 ENCOUNTER — Ambulatory Visit (INDEPENDENT_AMBULATORY_CARE_PROVIDER_SITE_OTHER): Payer: PPO

## 2021-10-22 DIAGNOSIS — J309 Allergic rhinitis, unspecified: Secondary | ICD-10-CM | POA: Diagnosis not present

## 2021-10-28 NOTE — Patient Instructions (Incomplete)
1. Seasonal and perennial allergic rhinitis (skin test positive DZ:[id number], ragweed, trees, indoor molds, outdoor molds, cat, and dog) - Continue taking: Your over-the-counter antihistamine once a day as needed for any. ( Carbinoxamine was too expensive$68.99, Allegra did not work and caused really bad headaches, levocetirizine and Singulair caused headaches, and loratadine did not help at all) - You can use an extra dose of the antihistamine, if needed, for breakthrough symptoms.  - Continue saline nasal spray 1-2 times daily to remove allergens from the nasal cavities as well as help with mucous clearance (this is especially helpful to do before the nasal sprays are given) - Continue allergy injections per protocol and have access to your epinephrine auto injector device  2.  Shortness of breath/cough May use albuterol using 2 puffs every 4-6 hours as needed for cough, wheeze, tightness in chest, or shortness of breath to see if this helps with your shortness of breath and cough.   Follow up in  months or sooner if needed    Reducing Pollen Exposure  The American Academy of Allergy, Asthma and Immunology suggests the following steps to reduce your exposure to pollen during allergy seasons.    Do not hang sheets or clothing out to dry; pollen may collect on these items. Do not mow lawns or spend time around freshly cut grass; mowing stirs up pollen. Keep windows closed at night.  Keep car windows closed while driving. Minimize morning activities outdoors, a time when pollen counts are usually at their highest. Stay indoors as much as possible when pollen counts or humidity is high and on windy days when pollen tends to remain in the air longer. Use air conditioning when possible.  Many air conditioners have filters that trap the pollen spores. Use a HEPA room air filter to remove pollen form the indoor air you breathe.  Control of Mold Allergen   Mold and fungi can grow on a variety of  surfaces provided certain temperature and moisture conditions exist.  Outdoor molds grow on plants, decaying vegetation and soil.  The major outdoor mold, Alternaria and Cladosporium, are found in very high numbers during hot and dry conditions.  Generally, a late Summer - Fall peak is seen for common outdoor fungal spores.  Rain will temporarily lower outdoor mold spore count, but counts rise rapidly when the rainy period ends.  The most important indoor molds are Aspergillus and Penicillium.  Dark, humid and poorly ventilated basements are ideal sites for mold growth.  The next most common sites of mold growth are the bathroom and the kitchen.  Outdoor (Seasonal) Mold Control  Positive outdoor molds via skin testing: Bipolaris (Helminthsporium), Drechslera (Curvalaria), and Mucor  Use air conditioning and keep windows closed Avoid exposure to decaying vegetation. Avoid leaf raking. Avoid grain handling. Consider wearing a face mask if working in moldy areas.    Indoor (Perennial) Mold Control   Positive indoor molds via skin testing: Fusarium, Aureobasidium (Pullulara), and Rhizopus  Maintain humidity below 50%. Clean washable surfaces with 5% bleach solution. Remove sources e.g. contaminated carpets.    Control of Dog or Cat Allergen  Avoidance is the best way to manage a dog or cat allergy. If you have a dog or cat and are allergic to dog or cats, consider removing the dog or cat from the home. If you have a dog or cat but don't want to find it a new home, or if your family wants a pet even though someone in the household  is allergic, here are some strategies that may help keep symptoms at bay:  Keep the pet out of your bedroom and restrict it to only a few rooms. Be advised that keeping the dog or cat in only one room will not limit the allergens to that room. Don't pet, hug or kiss the dog or cat; if you do, wash your hands with soap and water. High-efficiency particulate air  (HEPA) cleaners run continuously in a bedroom or living room can reduce allergen levels over time. Regular use of a high-efficiency vacuum cleaner or a central vacuum can reduce allergen levels. Giving your dog or cat a bath at least once a week can reduce airborne allergen.

## 2021-10-29 ENCOUNTER — Ambulatory Visit: Payer: PPO | Admitting: Family

## 2021-10-29 ENCOUNTER — Encounter: Payer: Self-pay | Admitting: Family

## 2021-10-29 VITALS — BP 118/68 | HR 70 | Temp 98.3°F | Ht 65.0 in | Wt 199.2 lb

## 2021-10-29 DIAGNOSIS — G500 Trigeminal neuralgia: Secondary | ICD-10-CM | POA: Diagnosis not present

## 2021-10-29 DIAGNOSIS — J3089 Other allergic rhinitis: Secondary | ICD-10-CM

## 2021-10-29 DIAGNOSIS — R059 Cough, unspecified: Secondary | ICD-10-CM

## 2021-10-29 DIAGNOSIS — M950 Acquired deformity of nose: Secondary | ICD-10-CM

## 2021-10-29 DIAGNOSIS — J302 Other seasonal allergic rhinitis: Secondary | ICD-10-CM

## 2021-10-29 NOTE — Progress Notes (Signed)
[name], [address] [name] [address] Dept: [phone]  FOLLOW UP NOTE  Patient ID: Margaret Gilmore, female    DOB: 1956-03-02  Age: 66 y.o. MRN: [id number] Date of Office Visit: 10/29/2021  Assessment  Chief Complaint: Allergic Rhinitis  (Feels no change, sneezing and headaches. Taking otc allergy meds. )  HPI Margaret Gilmore is a 66 year old female who presents today for follow-up of seasonal and perennial allergic rhinitis, trigeminal neuralgia-with resulting nasal deformity, therefore unable to do nasal sprays, and multiple ophthalmic issues including a macular hole as well as retinal detachments.  She was last seen on July 02, 2021 by myself.  Since her last office visit she denies any new diagnosis or surgeries.  She does report that she has upcoming surgeries planned in January and February for her nasal deformity.  Seasonal and perennial allergic rhinitis is reported as not well controlled with saline spray as needed.  She is currently not taking any antihistamines at this time.  In the past carbinoxamine was too expensive, Allegra did not work and caused a headache, levo cetirizine and Singulair caused a headache, and loratadine did not work at all.  She also reports that Zyrtec caused a headache and she is not sure if this helped.  She is currently still in the buildup process with her allergy injections and cannot tell if they are helping yet.  She denies any large local reactions at the injection site.  She reports sneezing, a lot of rhinorrhea especially with walking or climate changes, postnasal drip that is times a little yellow, and nasal congestion on the right side.  She is hoping that her upcoming surgery will help with the nasal congestion.  She has not had any sinus infections since we last saw her.  This time of the year she reports that her allergies are rough.  Most recently she cleaned out her dryer vent and sneezed a lot and got a headache.  From now on  she is going to wear a mask when she is working with her dryer.  She tries to stay away from all the things that bother her.  She has notice that her voice changes a lot.  She has not spoken to her ENT about this, but does have an upcoming appointment in June 02, 2022. Instructed her to call and get an earlier appointment to discuss the changes in her voice  She also mentions feeling a sensation of something prickling in her throat.  Her ENT does not want her using steroid nasal sprays.  She reports itchy watery eyes and also the vision around the edge is not clear.  This has been going on for a while now and not new.  Recommended her going to see her eye doctor if any of these symptoms are new since her last office visit.  On July 10 she is seeing her GI doctor Dr. Fuller Plan for endoscopy.  She reports that that when she is eating food it feels like it is bigger than the area that it is trying to go down.  Her Nexium has been changed to another medication that she is not sure of the name, but she is taking it twice a day and having to set a reminder on her phone.     Drug Allergies:  Allergies  Allergen Reactions   Pregabalin Swelling   Diphenhydramine Nausea And Vomiting    Severe headaches  Severe headaches  Severe headaches    Nitrous Oxide Other (  See Comments)    Pt has gas bubble in right eye- will cause blindness per pt  Pt has gas bubble in right eye- will cause blindness per pt  Pt has gas bubble in right eye- will cause blindness per pt    Sulfa Antibiotics Other (See Comments)    Headache    Review of Systems: Review of Systems  Constitutional:  Negative for chills and fever.       Reports at times that she feels like she may have a fever, but she does not.  HENT:         Reports sinus pressure, sneezing, a lot of clear rhinorrhea especially when in different climates, nasal congestion on the right side, and postnasal drip that is at times a little yellow.  Eyes:         Reports that the vision around the edges of her eyes is different, but this has been going on for a while.  Recommended she schedule an appointment with her eye doctor.  She reports this has been going on for a while.  She also reports itchy watery eyes.  She uses refresh drops that she reports makes stuff come out of the eyes.  She has a history of multiple ophthalmic issues including macular hole as well as retinal detachments.  Respiratory:  Positive for cough. Negative for shortness of breath and wheezing.        Reports cough possibly due to post nasal drip.  She also reports that she feels a prickling sensation in her throat.  She also has the sensation of her food feeling big and going through an narrower area.  She has an endoscopy scheduled for July 10.  She reports a history of stricture.  She is currently taking reflux medicine twice a day, but does not remember the name  Cardiovascular:  Negative for chest pain and palpitations.  Gastrointestinal:        Denies heartburn or reflux symptoms, but reports that she was changed from Nexium to another medicine that is twice a day dosing.  She does have sensation of her food feeling big going down a smaller area.  Genitourinary:  Negative for frequency.  Skin:  Negative for itching and rash.  Neurological:  Positive for headaches.       Reports that she has daily headaches and will take samples of Nurtec from her primary care physician  Endo/Heme/Allergies:  Positive for environmental allergies.     Physical Exam: BP 118/68   Pulse 70   Temp 98.3 F (36.8 C)   Ht '5\' 5"'$  (1.651 m)   Wt 199 lb 4 oz (90.4 kg)   SpO2 97%   BMI 33.16 kg/m    Physical Exam HENT:     Head: Normocephalic and atraumatic.     Comments: Pharynx normal. Eyes normal. Ears: unable to see right tympanic membrane due to cerumen. Left ear normal.  Nose:right nasal deformity present.    Right Ear: Ear canal and external ear normal.     Left Ear: Tympanic membrane,  ear canal and external ear normal.     Mouth/Throat:     Mouth: Mucous membranes are moist.     Pharynx: Oropharynx is clear.  Eyes:     Conjunctiva/sclera: Conjunctivae normal.  Cardiovascular:     Rate and Rhythm: Normal rate and regular rhythm.     Heart sounds: Normal heart sounds.  Pulmonary:     Effort: Pulmonary effort is normal.  Breath sounds: Normal breath sounds.     Comments: Lungs clear to auscultation Musculoskeletal:     Cervical back: Neck supple.  Skin:    General: Skin is warm.  Neurological:     Mental Status: She is alert and oriented to person, place, and time.  Psychiatric:        Mood and Affect: Mood normal.        Behavior: Behavior normal.        Thought Content: Thought content normal.        Judgment: Judgment normal.     Diagnostics:  None  Assessment and Plan: 1. Seasonal and perennial allergic rhinitis   2. Cough, unspecified type   3. Nasal deformity, acquired   4. Trigeminal neuralgia     No orders of the defined types were placed in this encounter.   Patient Instructions  1. Seasonal and perennial allergic rhinitis (skin test positive YB:WLSLHTD, ragweed, trees, indoor molds, outdoor molds, cat, and dog) -  ( Carbinoxamine was too expensive$68.99, Allegra did not work and caused really bad headaches, levocetirizine and Singulair caused headaches, and loratadine did not help at all) - Call your ENT and see if they are ok for you to start Astelin (azelstine) nasal spray or Atrovent (ipratropium bromide)nasal spray. If this is ok call our office and let us know -Schedule an appointment with your ENT to discuss your raspy voice - Continue saline nasal spray 1-2 times daily to remove allergens from the nasal cavities as well as help with mucous clearance (this is especially helpful to do before the nasal sprays are given) - Continue allergy injections per protocol and have access to your epinephrine auto injector device -Schedule an  appointment with your eye doctor to find out what other options you have that you can use for your itchy watery eyes  2.  Shortness of breath/cough May use albuterol using 2 puffs every 4-6 hours as needed for cough, wheeze, tightness in chest, or shortness of breath to see if this helps with your shortness of breath and cough.   Follow up in 6-12 months or sooner if needed    Reducing Pollen Exposure  The American Academy of Allergy, Asthma and Immunology suggests the following steps to reduce your exposure to pollen during allergy seasons.    Do not hang sheets or clothing out to dry; pollen may collect on these items. Do not mow lawns or spend time around freshly cut grass; mowing stirs up pollen. Keep windows closed at night.  Keep car windows closed while driving. Minimize morning activities outdoors, a time when pollen counts are usually at their highest. Stay indoors as much as possible when pollen counts or humidity is high and on windy days when pollen tends to remain in the air longer. Use air conditioning when possible.  Many air conditioners have filters that trap the pollen spores. Use a HEPA room air filter to remove pollen form the indoor air you breathe.  Control of Mold Allergen   Mold and fungi can grow on a variety of surfaces provided certain temperature and moisture conditions exist.  Outdoor molds grow on plants, decaying vegetation and soil.  The major outdoor mold, Alternaria and Cladosporium, are found in very high numbers during hot and dry conditions.  Generally, a late Summer - Fall peak is seen for common outdoor fungal spores.  Rain will temporarily lower outdoor mold spore count, but counts rise rapidly when the rainy period ends.  The most important  indoor molds are Aspergillus and Penicillium.  Dark, humid and poorly ventilated basements are ideal sites for mold growth.  The next most common sites of mold growth are the bathroom and the kitchen.  Outdoor  (Seasonal) Mold Control  Positive outdoor molds via skin testing: Bipolaris (Helminthsporium), Drechslera (Curvalaria), and Mucor  Use air conditioning and keep windows closed Avoid exposure to decaying vegetation. Avoid leaf raking. Avoid grain handling. Consider wearing a face mask if working in moldy areas.    Indoor (Perennial) Mold Control   Positive indoor molds via skin testing: Fusarium, Aureobasidium (Pullulara), and Rhizopus  Maintain humidity below 50%. Clean washable surfaces with 5% bleach solution. Remove sources e.g. contaminated carpets.    Control of Dog or Cat Allergen  Avoidance is the best way to manage a dog or cat allergy. If you have a dog or cat and are allergic to dog or cats, consider removing the dog or cat from the home. If you have a dog or cat but don't want to find it a new home, or if your family wants a pet even though someone in the household is allergic, here are some strategies that may help keep symptoms at bay:  Keep the pet out of your bedroom and restrict it to only a few rooms. Be advised that keeping the dog or cat in only one room will not limit the allergens to that room. Don't pet, hug or kiss the dog or cat; if you do, wash your hands with soap and water. High-efficiency particulate air (HEPA) cleaners run continuously in a bedroom or living room can reduce allergen levels over time. Regular use of a high-efficiency vacuum cleaner or a central vacuum can reduce allergen levels. Giving your dog or cat a bath at least once a week can reduce airborne allergen.       Return in about 6 months (around 04/30/2022).    Thank you for the opportunity to care for this patient.  Please do not hesitate to contact me with questions.  [name], FNP [facility]

## 2021-10-31 ENCOUNTER — Ambulatory Visit (INDEPENDENT_AMBULATORY_CARE_PROVIDER_SITE_OTHER): Payer: PPO

## 2021-10-31 ENCOUNTER — Telehealth: Payer: Self-pay | Admitting: Family

## 2021-10-31 DIAGNOSIS — J309 Allergic rhinitis, unspecified: Secondary | ICD-10-CM | POA: Diagnosis not present

## 2021-10-31 MED ORDER — AZELASTINE HCL 0.1 % NA SOLN: NASAL | 5 refills | Status: DC

## 2021-10-31 NOTE — Telephone Encounter (Signed)
Patient spoke to her ENT - she is okay to use both the astelin and atrovent nasal sprays.   Patient would like a prescription sent to [facility] [address] [place], [place] [address]

## 2021-10-31 NOTE — Telephone Encounter (Signed)
Please send in a prescription for  azelastine using 1 spray each nostril twice a day AS NEEDED for runny nose/drainage down throat. Only send 1 refill until we see how she does

## 2021-10-31 NOTE — Telephone Encounter (Signed)
azelastine using 1 spray each nostril twice a day AS NEEDED for runny nose/drainage down throat. Patient has been notifed

## 2021-11-04 NOTE — Telephone Encounter (Signed)
Thank you :)

## 2021-11-05 ENCOUNTER — Ambulatory Visit (INDEPENDENT_AMBULATORY_CARE_PROVIDER_SITE_OTHER): Payer: PPO

## 2021-11-05 DIAGNOSIS — J309 Allergic rhinitis, unspecified: Secondary | ICD-10-CM | POA: Diagnosis not present

## 2021-11-12 ENCOUNTER — Encounter: Payer: PPO | Admitting: Gastroenterology

## 2021-11-14 ENCOUNTER — Ambulatory Visit (INDEPENDENT_AMBULATORY_CARE_PROVIDER_SITE_OTHER): Payer: PPO

## 2021-11-14 DIAGNOSIS — J309 Allergic rhinitis, unspecified: Secondary | ICD-10-CM

## 2021-11-17 ENCOUNTER — Telehealth: Payer: Self-pay | Admitting: Adult Health

## 2021-11-17 NOTE — Telephone Encounter (Signed)
ongoing migrane headaches, would like to take next steps to evaluate w/ specialist. Also, has only 1 sample left of Nuratec, if advisable to send rx, please send to [facility]

## 2021-11-21 ENCOUNTER — Ambulatory Visit (INDEPENDENT_AMBULATORY_CARE_PROVIDER_SITE_OTHER): Payer: PPO

## 2021-11-21 DIAGNOSIS — J309 Allergic rhinitis, unspecified: Secondary | ICD-10-CM

## 2021-11-24 ENCOUNTER — Encounter: Payer: Self-pay | Admitting: Gastroenterology

## 2021-11-24 ENCOUNTER — Ambulatory Visit (AMBULATORY_SURGERY_CENTER): Payer: PPO | Admitting: Gastroenterology

## 2021-11-24 VITALS — BP 110/63 | HR 63 | Temp 97.7°F | Resp 19 | Ht 64.0 in | Wt 202.0 lb

## 2021-11-24 DIAGNOSIS — K219 Gastro-esophageal reflux disease without esophagitis: Secondary | ICD-10-CM

## 2021-11-24 DIAGNOSIS — K319 Disease of stomach and duodenum, unspecified: Secondary | ICD-10-CM | POA: Diagnosis not present

## 2021-11-24 DIAGNOSIS — R1310 Dysphagia, unspecified: Secondary | ICD-10-CM | POA: Diagnosis not present

## 2021-11-24 DIAGNOSIS — K2970 Gastritis, unspecified, without bleeding: Secondary | ICD-10-CM | POA: Diagnosis not present

## 2021-11-24 DIAGNOSIS — B9681 Helicobacter pylori [H. pylori] as the cause of diseases classified elsewhere: Secondary | ICD-10-CM

## 2021-11-24 DIAGNOSIS — K449 Diaphragmatic hernia without obstruction or gangrene: Secondary | ICD-10-CM

## 2021-11-24 MED ORDER — SODIUM CHLORIDE 0.9 % IV SOLN: 500.0000 mL | Freq: Once | INTRAVENOUS | Status: DC

## 2021-11-24 NOTE — Op Note (Signed)
[facility] Patient Name: Margaret Gilmore Procedure Date: 11/24/2021 9:38 AM MRN: [id number] Endoscopist: Ladene Artist , MD Age: 66 Referring MD:  Date of Birth: 01/30/1956 Gender: Female Account #: [id number] Procedure:                Upper GI endoscopy Indications:              Dysphagia, Gastroesophageal reflux disease Medicines:                Monitored Anesthesia Care Procedure:                Pre-Anesthesia Assessment:                           - Prior to the procedure, a History and Physical                            was performed, and patient medications and                            allergies were reviewed. The patient's tolerance of                            previous anesthesia was also reviewed. The risks                            and benefits of the procedure and the sedation                            options and risks were discussed with the patient.                            All questions were answered, and informed consent                            was obtained. Prior Anticoagulants: The patient has                            taken no previous anticoagulant or antiplatelet                            agents. ASA Grade Assessment: II - A patient with                            mild systemic disease. After reviewing the risks                            and benefits, the patient was deemed in                            satisfactory condition to undergo the procedure.                           After obtaining informed consent, the endoscope was  passed under direct vision. Throughout the                            procedure, the patient's blood pressure, pulse, and                            oxygen saturations were monitored continuously. The                            Endoscope was introduced through the mouth, and                            advanced to the second part of duodenum. The upper                            GI endoscopy  was accomplished without difficulty.                            The patient tolerated the procedure well. Scope In: Scope Out: Findings:                 No endoscopic abnormality was evident in the                            esophagus to explain the patient's complaint of                            dysphagia. It was decided, however, to proceed with                            dilation of the entire esophagus. A guidewire was                            placed and the scope was withdrawn. Dilation was                            performed with a Savary dilator with no resistance                            at 17 mm.                           A small hiatal hernia was present.                           Localized mild inflammation characterized by                            erythema and granularity was found in the gastric                            antrum. Biopsies were taken with a cold forceps for  histology.                           The exam of the stomach was otherwise normal.                           The duodenal bulb and second portion of the                            duodenum were normal. Complications:            No immediate complications. Estimated Blood Loss:     Estimated blood loss was minimal. Impression:               - No endoscopic esophageal abnormality to explain                            patient's dysphagia. Esophagus dilated.                           - Small hiatal hernia.                           - Gastritis. Biopsied.                           - Normal duodenal bulb and second portion of the                            duodenum. Recommendation:           - Patient has a contact number available for                            emergencies. The signs and symptoms of potential                            delayed complications were discussed with the                            patient. Return to normal activities tomorrow.                             Written discharge instructions were provided to the                            patient.                           - Clear liquid diet for 2 hours, then advance as                            tolerated to soft diet.                           - Resume prior diet tomorrow.                           -  Follow antireflux measures.                           - Continue present medications.                           - Await pathology results. Ladene Artist, MD 11/24/2021 9:55:52 AM This report has been signed electronically.

## 2021-11-24 NOTE — Patient Instructions (Signed)
HANDOUTS ON DILATATION DIET, GASTRITIS AND ANTIREFLUX MEASURES GIVEN.  CLEAR LIQUID DIET FOR 2 HOURS THEN ADVANCE AS TOLERATED TO SOFT DIET.  RESUME PRIOR DIET TOMORROW.   FOLLOW ANTIREFLUX MEASURES.     YOU HAD AN ENDOSCOPIC PROCEDURE TODAY AT THE [facility]:   Refer to the procedure report that was given to you for any specific questions about what was found during the examination.  If the procedure report does not answer your questions, please call your gastroenterologist to clarify.  If you requested that your care partner not be given the details of your procedure findings, then the procedure report has been included in a sealed envelope for you to review at your convenience later.  YOU SHOULD EXPECT: Some feelings of bloating in the abdomen. Passage of more gas than usual.  Walking can help get rid of the air that was put into your GI tract during the procedure and reduce the bloating. If you had a lower endoscopy (such as a colonoscopy or flexible sigmoidoscopy) you may notice spotting of blood in your stool or on the toilet paper. If you underwent a bowel prep for your procedure, you may not have a normal bowel movement for a few days.  Please Note:  You might notice some irritation and congestion in your nose or some drainage.  This is from the oxygen used during your procedure.  There is no need for concern and it should clear up in a day or so.  SYMPTOMS TO REPORT IMMEDIATELY:    Following upper endoscopy (EGD)  Vomiting of blood or coffee ground material  New chest pain or pain under the shoulder blades  Painful or persistently difficult swallowing  New shortness of breath  Fever of 100F or higher  Black, tarry-looking stools  For urgent or emergent issues, a gastroenterologist can be reached at any hour by calling [phone]. Do not use MyChart messaging for urgent concerns.    DIET:  We do recommend a small meal at first, but then you may proceed to your  regular diet.  Drink plenty of fluids but you should avoid alcoholic beverages for 24 hours.  ACTIVITY:  You should plan to take it easy for the rest of today and you should NOT DRIVE or use heavy machinery until tomorrow (because of the sedation medicines used during the test).    FOLLOW UP: Our staff will call the number listed on your records the next business day following your procedure.  We will call around 7:15- 8:00 am to check on you and address any questions or concerns that you may have regarding the information given to you following your procedure. If we do not reach you, we will leave a message.  If you develop any symptoms (ie: fever, flu-like symptoms, shortness of breath, cough etc.) before then, please call [phone].  If you test positive for Covid 19 in the 2 weeks post procedure, please call and report this information to [place].    If any biopsies were taken you will be contacted by phone or by letter within the next 1-3 weeks.  Please call us at [phone] if you have not heard about the biopsies in 3 weeks.    SIGNATURES/CONFIDENTIALITY: You and/or your care partner have signed paperwork which will be entered into your electronic medical record.  These signatures attest to the fact that that the information above on your After Visit Summary has been reviewed and is understood.  Full responsibility of the confidentiality  of this discharge information lies with you and/or your care-partner.  

## 2021-11-24 NOTE — Progress Notes (Signed)
History & Physical  Primary Care Physician:  [name], NP Primary Gastroenterologist: Lucio Edward, MD  CHIEF COMPLAINT:  GERD, dysphagia f  HPI: Margaret Gilmore is a 66 y.o. female with GERD, dysphagia for EGD with possible dilation.   Past Medical History:  Diagnosis Date   Anxiety    situational   Arthritis    B12 deficiency    hx of , no longer taking medication   Cataracts, bilateral    Constipation    Depression    situational   Esophageal stricture    Family history of adverse reaction to anesthesia    mom had problems waking up   Fatty liver    GERD (gastroesophageal reflux disease)    Headache    migraines    Macular hole of left eye    Retinal detachment with multiple breaks, right 07/27/2017   Trigeminal neuralgia    right side of face is numb   Unspecified vitamin D deficiency     Past Surgical History:  Procedure Laterality Date   25 GAUGE PARS PLANA VITRECTOMY WITH 20 GAUGE MVR PORT Left 11/09/2017   25 GAUGE PARS PLANA VITRECTOMY WITH 20 GAUGE MVR PORT Left 11/09/2017   Procedure: 25 GAUGE PARS PLANA VITRECTOMY WITH 20 GAUGE MVR PORT;  Surgeon: [name], MD;  Location: [place];  Service: Ophthalmology;  Laterality: Left;   25 GAUGE PARS PLANA VITRECTOMY WITH 20 GAUGE MVR PORT FOR MACULAR HOLE Left 03/05/2015   Procedure: 25 GAUGE PARS PLANA VITRECTOMY WITH 20 GAUGE MVR PORT FOR MACULAR HOLE;  Surgeon: [name], MD;  Location: [place];  Service: Ophthalmology;  Laterality: Left;   25 GAUGE PARS PLANA VITRECTOMY WITH 20 GAUGE MVR PORT FOR MACULAR HOLE Right 06/22/2017   25 GAUGE PARS PLANA VITRECTOMY WITH 20 GAUGE MVR PORT FOR MACULAR HOLE Right 06/22/2017   Procedure: 25 GAUGE PARS PLANA VITRECTOMY WITH 20 GAUGE MVR PORT FOR MACULAR HOLE;  Surgeon: [name], MD;  Location: [place];  Service: Ophthalmology;  Laterality: Right;   ABDOMINAL HYSTERECTOMY     Total   APPENDECTOMY     CESAREAN SECTION     COLONOSCOPY      ESOPHAGOGASTRODUODENOSCOPY (EGD) WITH ESOPHAGEAL DILATION     EYE SURGERY     GAS/FLUID EXCHANGE Left 03/05/2015   Procedure: GAS/FLUID EXCHANGE;  Surgeon: [name], MD;  Location: [place];  Service: Ophthalmology;  Laterality: Left;  CF38   GAS/FLUID EXCHANGE Right 06/22/2017   Procedure: GAS/FLUID EXCHANGE;  Surgeon: [name], MD;  Location: [place];  Service: Ophthalmology;  Laterality: Right;   GAS/FLUID EXCHANGE Right 07/27/2017   Procedure: GAS/FLUID EXCHANGE;  Surgeon: [name], MD;  Location: [place];  Service: Ophthalmology;  Laterality: Right;   GAS/FLUID EXCHANGE Left 11/09/2017   Procedure: GAS/FLUID EXCHANGE;  Surgeon: [name], MD;  Location: [place];  Service: Ophthalmology;  Laterality: Left;   knot removed from right side of abdomen     LASER PHOTO ABLATION Left 03/05/2015   Procedure: LASER PHOTO ABLATION;  Surgeon: [name], MD;  Location: [place];  Service: Ophthalmology;  Laterality: Left;  Headscope Laser   LASER PHOTO ABLATION Right 06/22/2017   Procedure: LASER PHOTO ABLATION;  Surgeon: [name], MD;  Location: [place];  Service: Ophthalmology;  Laterality: Right;   LASER PHOTO ABLATION Right 07/27/2017   Procedure: LASER PHOTO ABLATION;  Surgeon: [name], MD;  Location: [place];  Service:  Ophthalmology;  Laterality: Right;   LASER PHOTO ABLATION Left 11/09/2017   Procedure: LASER PHOTO ABLATION;  Surgeon: [name], MD;  Location: [place];  Service: Ophthalmology;  Laterality: Left;   MEMBRANE PEEL Left 03/05/2015   Procedure: MEMBRANE PEEL;  Surgeon: [name], MD;  Location: [place];  Service: Ophthalmology;  Laterality: Left;   MEMBRANE PEEL Right 06/22/2017   Procedure: MEMBRANE PEEL;  Surgeon: [name], MD;  Location: [place];  Service: Ophthalmology;  Laterality: Right;   MEMBRANE PEEL Right 07/27/2017   Procedure: MEMBRANE PEEL;  Surgeon: [name], MD;  Location: [place];  Service: Ophthalmology;  Laterality:  Right;   MEMBRANE PEEL Left 11/09/2017   Procedure: MEMBRANE PEEL;  Surgeon: [name], MD;  Location: [place];  Service: Ophthalmology;  Laterality: Left;   SCLERAL BUCKLE WITH POSSIBLE 25 GAUGE PARS PLANA VITRECTOMY Right 07/27/2017   Procedure: SCLERAL BUCKLE  25 GAUGE PARS PLANA VITRECTOMY, C3F8 INJECTION REPAIR OF COMPLEX TRACTION OF RETINAL DETACHMENT;  Surgeon: [name], MD;  Location: [place];  Service: Ophthalmology;  Laterality: Right;   SERUM PATCH Left 03/05/2015   Procedure: SERUM PATCH;  Surgeon: [name], MD;  Location: [place];  Service: Ophthalmology;  Laterality: Left;   SERUM PATCH Right 06/22/2017   Procedure: SERUM PATCH;  Surgeon: [name], MD;  Location: [place];  Service: Ophthalmology;  Laterality: Right;   SERUM PATCH Left 11/09/2017   Procedure: SERUM PATCH;  Surgeon: [name], MD;  Location: [place];  Service: Ophthalmology;  Laterality: Left;   trigeminal       Prior to Admission medications   Medication Sig Start Date End Date Taking? Authorizing Provider  Ascorbic Acid (VITAMIN C PO) Take 1 tablet by mouth daily.    [provider]  azelastine (ASTELIN) 0.1 % nasal spray Apply 1 spray each nostril twice a day AS NEEDED for runny nose/drainage down throat. 10/31/21   [name], FNP  Cholecalciferol (VITAMIN D3) 25 MCG (1000 UT) CAPS Take 1 capsule by mouth daily.    [provider]  EPINEPHrine 0.3 mg/0.3 mL IJ SOAJ injection Inject 0.3 mg into the muscle as directed. 04/30/21   [provider]  esomeprazole (NEXIUM) 40 MG capsule Take 1 capsule (40 mg total) by mouth 2 (two) times daily before a meal. 10/09/21   Ladene Artist, MD  Magnesium Gluconate 550 MG TABS Take 1 tablet by mouth daily.    [provider]  Multiple Vitamins-Minerals (MULTIVITAMIN WITH MINERALS) tablet Take 1 tablet by mouth daily.    [provider]  Omega-3 Fatty Acids (FISH OIL) 1200 MG CAPS Take 2 capsules by mouth  daily. 02/21/21   [provider]  OVER THE COUNTER MEDICATION Take 1 tablet by mouth daily. Allergy meds    [provider]  Polyethylene Glycol 3350 (MIRALAX PO) Take 17 g by mouth as needed.    [provider]  Rimegepant Sulfate (NURTEC) 75 MG TBDP Take 1 tab for migraine; can repeat in 2 hours if needed. 04/29/21   [name], NP  vitamin B-12 (CYANOCOBALAMIN) 500 MCG tablet Take 500 mcg by mouth daily.    [provider]    Current Outpatient Medications  Medication Sig Dispense Refill   Ascorbic Acid (VITAMIN C PO) Take 1 tablet by mouth daily.     azelastine (ASTELIN) 0.1 % nasal spray Apply 1 spray each nostril twice a day AS NEEDED for runny  nose/drainage down throat. 30 mL 5   Cholecalciferol (VITAMIN D3) 25 MCG (1000 UT) CAPS Take 1 capsule by mouth daily.     EPINEPHrine 0.3 mg/0.3 mL IJ SOAJ injection Inject 0.3 mg into the muscle as directed.     esomeprazole (NEXIUM) 40 MG capsule Take 1 capsule (40 mg total) by mouth 2 (two) times daily before a meal. 60 capsule 11   Magnesium Gluconate 550 MG TABS Take 1 tablet by mouth daily.     Multiple Vitamins-Minerals (MULTIVITAMIN WITH MINERALS) tablet Take 1 tablet by mouth daily.     Omega-3 Fatty Acids (FISH OIL) 1200 MG CAPS Take 2 capsules by mouth daily.     OVER THE COUNTER MEDICATION Take 1 tablet by mouth daily. Allergy meds     Polyethylene Glycol 3350 (MIRALAX PO) Take 17 g by mouth as needed.     Rimegepant Sulfate (NURTEC) 75 MG TBDP Take 1 tab for migraine; can repeat in 2 hours if needed. 2 tablet 0   vitamin B-12 (CYANOCOBALAMIN) 500 MCG tablet Take 500 mcg by mouth daily.     No current facility-administered medications for this visit.    Allergies as of 11/24/2021 - Review Complete 11/24/2021  Allergen Reaction Noted   Pregabalin Swelling 05/28/2013   Diphenhydramine Nausea And Vomiting 09/08/2017   Nitrous oxide Other (See Comments) 09/08/2017   Sulfa antibiotics Other  (See Comments) 05/28/2013    Family History  Problem Relation Age of Onset   Diabetes Mother    COPD Mother    Hyperlipidemia Mother    Pancreatic cancer Father 66   Hyperlipidemia Father    Heart disease Father    Hypertension Father    Hypertension Brother    Alzheimer's disease Maternal Grandmother    Bone cancer Maternal Grandfather        metz from prostate   Prostate cancer Maternal Grandfather    Breast cancer Maternal Aunt        BREAST   Alzheimer's disease Maternal Aunt    Hodgkin's lymphoma Paternal Aunt        HODGKINS   Colon cancer Paternal Uncle    Cancer Paternal Uncle        4 different kinds, can't recall what    Social History   Socioeconomic History   Marital status: Single    Spouse name: Not on file   Number of children: 2   Years of education: Not on file   Highest education level: Not on file  Occupational History   Occupation: caregiver  Tobacco Use   Smoking status: Never    Passive exposure: Current (neighbor smokes and smells smoke sometimes.)   Smokeless tobacco: Never  Vaping Use   Vaping Use: Never used  Substance and Sexual Activity   Alcohol use: No   Drug use: No   Sexual activity: Not Currently    Partners: Male    Birth control/protection: Post-menopausal  Other Topics Concern   Not on file  Social History Narrative   Not on file   Social Determinants of Health   Financial Resource Strain: Not on file  Food Insecurity: Not on file  Transportation Needs: Not on file  Physical Activity: Sufficiently Active (12/16/2017)   Exercise Vital Sign    Days of Exercise per Week: 7 days    Minutes of Exercise per Session: 30 min  Stress: No Stress Concern Present (12/16/2017)   [place]    Feeling of Stress :  Only a little  Social Connections: Not on file  Intimate Partner Violence: Not on file    Review of Systems:  All systems reviewed were negative except  where noted in HPI.   Physical Exam: General:  Alert, well-developed, in NAD Head:  Normocephalic and atraumatic. Eyes:  Sclera clear, no icterus.   Conjunctiva pink. Ears:  Normal auditory acuity. Mouth:  No deformity or lesions.  Neck:  Supple; no masses . Lungs:  Clear throughout to auscultation.   No wheezes, crackles, or rhonchi. No acute distress. Heart:  Regular rate and rhythm; no murmurs. Abdomen:  Soft, nondistended, nontender. No masses, hepatomegaly. No obvious masses.  Normal bowel .    Rectal:  Deferred   Msk:  Symmetrical without gross deformities.. Pulses:  Normal pulses noted. Extremities:  Without edema. Neurologic:  Alert and  oriented x4;  grossly normal neurologically. Skin:  Intact without significant lesions or rashes. Cervical Nodes:  No significant cervical adenopathy. Psych:  Alert and cooperative. Normal mood and affect.   Impression / Plan:   GERD, dysphagia for EGD with possible dilation.  Pricilla Riffle. [place] Plan  11/24/2021, 9:29 AM See Shea Evans, [facility], to contact our on call provider

## 2021-11-24 NOTE — Progress Notes (Signed)
Called to room to assist during endoscopic procedure.  Patient ID and intended procedure confirmed with present staff. Received instructions for my participation in the procedure from the performing physician.  

## 2021-11-24 NOTE — Progress Notes (Signed)
Report to PACU, RN, vss, BBS= Clear.  

## 2021-11-25 ENCOUNTER — Telehealth: Payer: Self-pay

## 2021-11-25 NOTE — Telephone Encounter (Signed)
  Follow up Call-     11/24/2021    9:28 AM  Call back number  Post procedure Call Back phone  # [phone]  Permission to leave phone message Yes     Patient questions:  Do you have a fever, pain , or abdominal swelling? No. Pain Score  0 *  Have you tolerated food without any problems? Yes.    Have you been able to return to your normal activities? Yes.    Do you have any questions about your discharge instructions: Diet   No. Medications  No. Follow up visit  No.  Do you have questions or concerns about your Care? No.  Actions: * If pain score is 4 or above: No action needed, pain <4.

## 2021-11-26 ENCOUNTER — Ambulatory Visit (INDEPENDENT_AMBULATORY_CARE_PROVIDER_SITE_OTHER): Payer: PPO

## 2021-11-26 DIAGNOSIS — J309 Allergic rhinitis, unspecified: Secondary | ICD-10-CM | POA: Diagnosis not present

## 2021-12-03 ENCOUNTER — Ambulatory Visit (INDEPENDENT_AMBULATORY_CARE_PROVIDER_SITE_OTHER): Payer: PPO

## 2021-12-03 DIAGNOSIS — J309 Allergic rhinitis, unspecified: Secondary | ICD-10-CM | POA: Diagnosis not present

## 2021-12-09 ENCOUNTER — Encounter: Payer: Self-pay | Admitting: Gastroenterology

## 2021-12-10 ENCOUNTER — Ambulatory Visit (INDEPENDENT_AMBULATORY_CARE_PROVIDER_SITE_OTHER): Payer: PPO

## 2021-12-10 DIAGNOSIS — J309 Allergic rhinitis, unspecified: Secondary | ICD-10-CM

## 2021-12-17 ENCOUNTER — Ambulatory Visit (INDEPENDENT_AMBULATORY_CARE_PROVIDER_SITE_OTHER): Payer: PPO

## 2021-12-17 DIAGNOSIS — J309 Allergic rhinitis, unspecified: Secondary | ICD-10-CM

## 2021-12-18 ENCOUNTER — Other Ambulatory Visit: Payer: Self-pay | Admitting: Nurse Practitioner

## 2021-12-18 ENCOUNTER — Other Ambulatory Visit: Payer: Self-pay | Admitting: Adult Health

## 2021-12-18 ENCOUNTER — Other Ambulatory Visit: Payer: Self-pay | Admitting: *Deleted

## 2021-12-18 ENCOUNTER — Other Ambulatory Visit: Payer: Self-pay | Admitting: Internal Medicine

## 2021-12-18 DIAGNOSIS — Z1231 Encounter for screening mammogram for malignant neoplasm of breast: Secondary | ICD-10-CM

## 2021-12-24 ENCOUNTER — Ambulatory Visit (INDEPENDENT_AMBULATORY_CARE_PROVIDER_SITE_OTHER): Payer: PPO

## 2021-12-24 DIAGNOSIS — J309 Allergic rhinitis, unspecified: Secondary | ICD-10-CM | POA: Diagnosis not present

## 2021-12-29 ENCOUNTER — Encounter: Payer: Self-pay | Admitting: Neurology

## 2021-12-29 ENCOUNTER — Ambulatory Visit: Payer: PPO | Admitting: Neurology

## 2021-12-29 VITALS — BP 143/79 | HR 71 | Ht 65.0 in | Wt 189.0 lb

## 2021-12-29 DIAGNOSIS — G4719 Other hypersomnia: Secondary | ICD-10-CM

## 2021-12-29 DIAGNOSIS — K219 Gastro-esophageal reflux disease without esophagitis: Secondary | ICD-10-CM | POA: Diagnosis not present

## 2021-12-29 DIAGNOSIS — F32A Depression, unspecified: Secondary | ICD-10-CM | POA: Diagnosis not present

## 2021-12-29 DIAGNOSIS — K222 Esophageal obstruction: Secondary | ICD-10-CM

## 2021-12-29 DIAGNOSIS — R519 Headache, unspecified: Secondary | ICD-10-CM

## 2021-12-29 DIAGNOSIS — G478 Other sleep disorders: Secondary | ICD-10-CM

## 2021-12-29 DIAGNOSIS — M952 Other acquired deformity of head: Secondary | ICD-10-CM

## 2021-12-29 NOTE — Progress Notes (Signed)
SLEEP MEDICINE CLINIC    Provider:  Larey Seat, MD  Primary Care Physician:  Unk Pinto, MD [address] [place] [address]     Referring Provider: Alycia Rossetti, [place] [address] [place],  [place] [address]          Chief Complaint according to patient   Patient presents with:     New Patient (Initial Visit)     Never had a sleep study. Unsure if she snores. Having ENT surgery in Jan 2024.      HISTORY OF PRESENT ILLNESS:  Margaret Gilmore is a 66 - year old Caucasian female patient seen upon referral on 12/29/2021 from NP Carlye Grippe  for a Sleep study.  Chief concern according to patient : " I am more tired in the morning than when I went to bed. I sleep ok, I wake with a headache, I have a deformed nose. I had surgery for trigeminal neuralgia in the 1990 in [facility], and a blood vessel and a nerve were injured- trigeminal neuralgia on the right face upper brach. I came out of surgery and my nose ws bleeding and I couldn't feel it- my vision is also poor now. I had a retinal detachment, and I cannot longer read, not drive. ' Never had a sleep study. Unsure if she snores. Having ENT surgery in Jan 2024.    Margaret Gilmore   has a past medical history of Allergy, Anxiety, Arthritis, B12 deficiency, Cataracts, bilateral, Constipation, Depression- see GDS, Esophageal stricture and GERD (gastroesophageal reflux disease), Headache, Macular hole of left eye, Retinal detachment with multiple breaks, right (07/27/2017), Trigeminal neuralgia, Bone spurs, tendonitis and Unspecified vitamin D deficiency. Several surgeries to the eye, face and nose. The right nasal opening is deformed.     Family medical /sleep history: Mother with OSA, COPD, on oxygen 24/ 7 - insomnia, nosebleeds.   Social history:  Patient is retired from [employer] and retired because she couldn't drive-  and lives in a household alone. Family status is divorced , with adult son 2 and  daughter  92 , 2  grandchildren.  Tobacco use; never .  ETOH use - rare ,  Caffeine intake in form of Coffee( 1-AM) Soda( /) Tea ( 'only when I go out ') and vitamin b12 energy drinks. Regular exercise in form of at home, walking .   Hobbies : ars and crafts   Sleep habits are as follows: The patient's dinner time is between 5-6 PM. The patient goes to bed at 10-11 PM and continues to sleep for 5-6 hours, wakes for one single  bathroom breaks, and can go back to sleep. There is no headache when she goes to the bathroom, The preferred sleep position is laterally, with the support of one  'my pillow' .  Head of bed is raised or GERD- 12 degrees.  Dreams are reportedly infrequent/vivid.    7.30 AM is the usual rise time. The patient wakes up at 7 - 7.15 with an alarm.   She reports not feeling refreshed or restored in AM, with symptoms such as dry mouth, morning headaches, and residual fatigue. Headaches sometimes resolve by them selves, sometimes she feels as if there a vice around face and neck.   Naps are taken infrequently, lasting  60  minutes and no more refreshing than nocturnal sleep.    Review of Systems: Out of a complete 14 system review, the patient complains of  only the following symptoms, and all other reviewed systems are negative.:  Fatigue, sleepiness , AM headaches, too Excedrin 4 times a week.  Nasal deformity. Tension headaches. Goody powders induced Ulcer.    How likely are you to doze in the following situations: 0 = not likely, 1 = slight chance, 2 = moderate chance, 3 = high chance   Sitting and Reading? Watching Television? Sitting inactive in a public place (theater or meeting)? As a passenger in a car for an hour without a break? Lying down in the afternoon when circumstances permit? Sitting and talking to someone? Sitting quietly after lunch without alcohol? In a car, while stopped for a few minutes in traffic?   Total = 12/ 24 points   FSS endorsed at 51/  63 points.  GDS 7/ 15 points.   Social History   Socioeconomic History   Marital status: Single    Spouse name: Not on file   Number of children: 2   Years of education: Not on file   Highest education level: Not on file  Occupational History   Occupation: caregiver  Tobacco Use   Smoking status: Never    Passive exposure: Current (neighbor smokes and smells smoke sometimes.)   Smokeless tobacco: Never  Vaping Use   Vaping Use: Never used  Substance and Sexual Activity   Alcohol use: No   Drug use: No   Sexual activity: Not Currently    Partners: Male    Birth control/protection: Post-menopausal, Surgical  Other Topics Concern   Not on file  Social History Narrative   Not on file   Social Determinants of Health   Financial Resource Strain: Not on file  Food Insecurity: Not on file  Transportation Needs: Not on file  Physical Activity: Sufficiently Active (12/16/2017)   Exercise Vital Sign    Days of Exercise per Week: 7 days    Minutes of Exercise per Session: 30 min  Stress: No Stress Concern Present (12/16/2017)   [place]    Feeling of Stress : Only a little  Social Connections: Not on file    Family History  Problem Relation Age of Onset   Diabetes Mother    COPD Mother    Hyperlipidemia Mother    Pancreatic cancer Father 51   Hyperlipidemia Father    Heart disease Father    Hypertension Father    Hypertension Brother    Breast cancer Maternal Aunt        BREAST   Alzheimer's disease Maternal Aunt    Hodgkin's lymphoma Paternal Aunt        HODGKINS   Colon cancer Paternal Uncle    Cancer Paternal Uncle        4 different kinds, can't recall what   Alzheimer's disease Maternal Grandmother    Bone cancer Maternal Grandfather        metz from prostate   Prostate cancer Maternal Grandfather    Esophageal cancer Neg Hx    Rectal cancer Neg Hx    Stomach cancer Neg Hx     Past Medical  History:  Diagnosis Date   Allergy    Anxiety    situational   Arthritis    B12 deficiency    hx of , no longer taking medication   Cataracts, bilateral    Constipation    Depression    situational   Esophageal stricture    Family history of adverse reaction to anesthesia  mom had problems waking up   Fatty liver    GERD (gastroesophageal reflux disease)    Headache    migraines    Macular hole of left eye    Retinal detachment with multiple breaks, right 07/27/2017   Trigeminal neuralgia    right side of face is numb   Unspecified vitamin D deficiency     Past Surgical History:  Procedure Laterality Date   25 GAUGE PARS PLANA VITRECTOMY WITH 20 GAUGE MVR PORT Left 11/09/2017   25 GAUGE PARS PLANA VITRECTOMY WITH 20 GAUGE MVR PORT Left 11/09/2017   Procedure: 25 GAUGE PARS PLANA VITRECTOMY WITH 20 GAUGE MVR PORT;  Surgeon: [name], MD;  Location: [place];  Service: Ophthalmology;  Laterality: Left;   25 GAUGE PARS PLANA VITRECTOMY WITH 20 GAUGE MVR PORT FOR MACULAR HOLE Left 03/05/2015   Procedure: 25 GAUGE PARS PLANA VITRECTOMY WITH 20 GAUGE MVR PORT FOR MACULAR HOLE;  Surgeon: [name], MD;  Location: [place];  Service: Ophthalmology;  Laterality: Left;   25 GAUGE PARS PLANA VITRECTOMY WITH 20 GAUGE MVR PORT FOR MACULAR HOLE Right 06/22/2017   25 GAUGE PARS PLANA VITRECTOMY WITH 20 GAUGE MVR PORT FOR MACULAR HOLE Right 06/22/2017   Procedure: 25 GAUGE PARS PLANA VITRECTOMY WITH 20 GAUGE MVR PORT FOR MACULAR HOLE;  Surgeon: [name], MD;  Location: [place];  Service: Ophthalmology;  Laterality: Right;   ABDOMINAL HYSTERECTOMY     Total   APPENDECTOMY     CESAREAN SECTION     COLONOSCOPY     ESOPHAGOGASTRODUODENOSCOPY (EGD) WITH ESOPHAGEAL DILATION     EYE SURGERY     GAS/FLUID EXCHANGE Left 03/05/2015   Procedure: GAS/FLUID EXCHANGE;  Surgeon: [name], MD;  Location: [place];  Service: Ophthalmology;  Laterality: Left;  CF38   GAS/FLUID EXCHANGE  Right 06/22/2017   Procedure: GAS/FLUID EXCHANGE;  Surgeon: [name], MD;  Location: [place];  Service: Ophthalmology;  Laterality: Right;   GAS/FLUID EXCHANGE Right 07/27/2017   Procedure: GAS/FLUID EXCHANGE;  Surgeon: [name], MD;  Location: [place];  Service: Ophthalmology;  Laterality: Right;   GAS/FLUID EXCHANGE Left 11/09/2017   Procedure: GAS/FLUID EXCHANGE;  Surgeon: [name], MD;  Location: [place];  Service: Ophthalmology;  Laterality: Left;   knot removed from right side of abdomen     LASER PHOTO ABLATION Left 03/05/2015   Procedure: LASER PHOTO ABLATION;  Surgeon: [name], MD;  Location: [place];  Service: Ophthalmology;  Laterality: Left;  Headscope Laser   LASER PHOTO ABLATION Right 06/22/2017   Procedure: LASER PHOTO ABLATION;  Surgeon: [name], MD;  Location: [place];  Service: Ophthalmology;  Laterality: Right;   LASER PHOTO ABLATION Right 07/27/2017   Procedure: LASER PHOTO ABLATION;  Surgeon: [name], MD;  Location: [place];  Service: Ophthalmology;  Laterality: Right;   LASER PHOTO ABLATION Left 11/09/2017   Procedure: LASER PHOTO ABLATION;  Surgeon: [name], MD;  Location: [place];  Service: Ophthalmology;  Laterality: Left;   MEMBRANE PEEL Left 03/05/2015   Procedure: MEMBRANE PEEL;  Surgeon: [name], MD;  Location: [place];  Service: Ophthalmology;  Laterality: Left;   MEMBRANE PEEL Right 06/22/2017   Procedure: MEMBRANE PEEL;  Surgeon: [name], MD;  Location: [place];  Service: Ophthalmology;  Laterality: Right;   MEMBRANE PEEL Right 07/27/2017   Procedure: MEMBRANE PEEL;  Surgeon: [name], MD;  Location: [place];  Service: Ophthalmology;  Laterality: Right;   MEMBRANE PEEL Left 11/09/2017   Procedure: MEMBRANE PEEL;  Surgeon: [name], MD;  Location: [place];  Service: Ophthalmology;  Laterality: Left;   SCLERAL BUCKLE WITH POSSIBLE 25 GAUGE PARS PLANA VITRECTOMY Right 07/27/2017   Procedure: SCLERAL BUCKLE   25 GAUGE PARS PLANA VITRECTOMY, C3F8 INJECTION REPAIR OF COMPLEX TRACTION OF RETINAL DETACHMENT;  Surgeon: [name], MD;  Location: [place];  Service: Ophthalmology;  Laterality: Right;   SERUM PATCH Left 03/05/2015   Procedure: SERUM PATCH;  Surgeon: [name], MD;  Location: [place];  Service: Ophthalmology;  Laterality: Left;   SERUM PATCH Right 06/22/2017   Procedure: SERUM PATCH;  Surgeon: [name], MD;  Location: [place];  Service: Ophthalmology;  Laterality: Right;   SERUM PATCH Left 11/09/2017   Procedure: SERUM PATCH;  Surgeon: [name], MD;  Location: [place];  Service: Ophthalmology;  Laterality: Left;   trigeminal        Current Outpatient Medications on File Prior to Visit  Medication Sig Dispense Refill   Ascorbic Acid (VITAMIN C PO) Take 1 tablet by mouth daily.     azelastine (ASTELIN) 0.1 % nasal spray Apply 1 spray each nostril twice a day AS NEEDED for runny nose/drainage down throat. 30 mL 5   Cholecalciferol (VITAMIN D3) 25 MCG (1000 UT) CAPS Take 1 capsule by mouth daily.     EPINEPHrine 0.3 mg/0.3 mL IJ SOAJ injection Inject 0.3 mg into the muscle as directed.     esomeprazole (NEXIUM) 40 MG capsule Take 1 capsule (40 mg total) by mouth 2 (two) times daily before a meal. 60 capsule 11   Multiple Vitamins-Minerals (MULTIVITAMIN WITH MINERALS) tablet Take 1 tablet by mouth daily.     Omega-3 Fatty Acids (FISH OIL) 1200 MG CAPS Take 2 capsules by mouth daily.     OVER THE COUNTER MEDICATION Take 1 tablet by mouth daily. Allergy meds     Polyethylene Glycol 3350 (MIRALAX PO) Take 17 g by mouth as needed.     Rimegepant Sulfate (NURTEC) 75 MG TBDP Take 1 tab for migraine; can repeat in 2 hours if needed. 2 tablet 0   vitamin B-12 (CYANOCOBALAMIN) 500 MCG tablet Take 500 mcg by mouth daily.     No current facility-administered medications on file prior to visit.    Allergies  Allergen Reactions   Pregabalin Swelling   Diphenhydramine Nausea And  Vomiting    Severe headaches  Severe headaches  Severe headaches    Nitrous Oxide Other (See Comments)    Pt has gas bubble in right eye- will cause blindness per pt  Pt has gas bubble in right eye- will cause blindness per pt  Pt has gas bubble in right eye- will cause blindness per pt    Sulfa Antibiotics Other (See Comments)    Headache    Physical exam:  Today's Vitals   12/29/21 1006  BP: (!) 143/79  Pulse: 71  Weight: 189 lb (85.7 kg)  Height: '5\' 5"'$  (1.651 m)   Body mass index is 31.45 kg/m.   Wt Readings from Last 3 Encounters:  12/29/21 189 lb (85.7 kg)  11/24/21 202 lb (91.6 kg)  10/29/21 199 lb 4 oz (90.4 kg)     Ht Readings from Last 3 Encounters:  12/29/21 '5\' 5"'$  (1.651 m)  11/24/21 '5\' 4"'$  (1.626 m)  10/29/21 '5\' 5"'$  (1.651 m)      General: The patient is awake,  alert and appears not in acute distress. The patient is well groomed. Head: Normocephalic, atraumatic. Neck is supple. Mallampati 3,  neck circumference:14. 75  inches .  Nasal airflow patent. The right Doran Durand is open to the outside- January 2024 is  planned for correction.   Retrognathia is not seen.  Dental status: biological - wore retainers. Crowded teeth.  Cardiovascular:  Regular rate and cardiac rhythm by pulse,  without distended neck veins. Respiratory: Lungs are clear to auscultation.  Skin:  Without evidence of ankle edema, or rash. Trunk: The patient's posture is erect.   Neurologic exam : The patient is awake and alert, oriented to place and time.   Memory subjective described as intact.  Attention span & concentration ability appears normal.  Speech is fluent,  without  dysarthria, dysphonia or aphasia.  Mood and affect are appropriate.   Cranial nerves: no loss of smell or taste reported  Pupils are equal and briskly reactive to light. Funduscopic exam deferred. Center scotoma..  Extraocular movements in vertical and horizontal planes were intact No Diplopia. Visual fields by  finger perimetry = Central scotoma.  Hearing was intact to soft voice and finger rubbing.    Facial sensation is abnormal on the right face-   Facial motor strength is symmetric and tongue and uvula move midline. Patient has pain with mouth opening. Clicking, popping. Neck ROM : rotation, tilt and flexion extension were normal for age and shoulder shrug was symmetrical.    Motor exam:  Symmetric bulk, tone and ROM.   Normal tone without cog -, symmetric grip strength .   Sensory: extremities.  Fine touch,vibration were normal.  Proprioception tested in the upper extremities was normal.   Coordination: Rapid alternating movements in the fingers/hands were of normal speed.  The Finger-to-nose maneuver was intact without evidence of ataxia, dysmetria or tremor.   Gait and station: Patient could rise unassisted from a seated position, walked without assistive device.  Stance is of normal width/ base and the patient turned with 3 steps.  Toe and heel walk were deferred.  Deep tendon reflexes: in the  upper and lower extremities are symmetric and intact.  Babinski response was deferred.      After spending a total time of  45  minutes face to face and additional time for physical and neurologic examination, review of laboratory studies,  personal review of imaging studies, reports and results of other testing and review of referral information / records as far as provided in visit, I have established the following assessments:   Laboratory tests from Dr. Janit Pagan included an elevated overall cholesterol as of February of this year at 219 and HDL of 50 LDL of 134 triglycerides 213.  She was not hypertensive in his office at the time her body mass index was 34.4 and today is 31.4.  She is taking Nurtec for migraine, she has been on vitamin B12 supplements by mouth, she is taking Xyzal.  1) Margaret Gilmore presents today with a elevated degree of daytime sleepiness, a high degree of fatigue and 7 out  of 15 points on a geriatric depression score. History of anxiety- OCD.   2)  She is presenting with morning headaches that appear to feel like an vice around her head and neck tension sometimes this has been able to resolve as the day goes on but after she had taken over-the-counter medication to help with.  Interestingly she is not going to bed with a headache she is waking up  with a headache.  She is unsure if she snores there has been nobody witnessing any apneas but her sleep is nonrestorative and not refreshing.  She states that she has to get up only once at night for bathroom break and at that time she has not noted severe headaches.Marland Kitchen 3)The patient has a very interesting history of over 20 years ago undergoing a trigeminal neuralgia surgery at the right ganglion leaving her with abnormal facial sensation and leading to an ongoing or repeated epistaxis that she could not feel but only taste.  It was later assumed that she may have 2 more and she underwent surgery which left her with a very deformed right nausea on, and leaving the nares open to the bony cavity.    My Plan is to proceed with:  1) we will need for Margaret Gilmore to undergo an attended sleep study, I need the best possible data to see if she has apnea, and how her nasal airflow affects her breathing.  She was able to lose over 20 pounds since May of this year so she may have helped herself into a milder category 3 of apnea if any apnea is found.  She has a family history of sleep apnea in her mother.   She is scheduled to have facial reconstruction in January 2024.  It would be very difficult at that time to see CPAP being used on a freshly operated face.  She will need to undergo a three step surgery , recovery is likely to take her into summer 2024.  So we have to also look which targeted therapies will be available for patient with her constellation of symptoms and physical findings. 2) Plan B ; HST   I would like to thank Unk Pinto, MD and Alycia Rossetti, [address] [place],  [place] [address] for allowing me to meet with and to take care of this pleasant patient.    I plan to follow up either personally or through our NP within 3- 4 months.   CC: I will share my notes with PCP. Marland Kitchen  Electronically signed by: Larey Seat, MD 12/29/2021 10:52 AM  [facility] Board certified by The [employer] of Sleep Medicine and Diplomate of the [employer] of Sleep Medicine. Board certified In Neurology through the [place], Fellow of the [employer] of Neurology. Medical Director of [facility].

## 2021-12-29 NOTE — Patient Instructions (Signed)
Quality Sleep Information, Adult Quality sleep is important for your mental and physical health. It also improves your quality of life. Quality sleep means you: Are asleep for most of the time you are in bed. Fall asleep within 30 minutes. Wake up no more than once a night. Are awake for no longer than 20 minutes if you do wake up during the night. Most adults need 7-8 hours of quality sleep each night. How can poor sleep affect me? If you do not get enough quality sleep, you may have: Mood swings. Daytime sleepiness. Decreased alertness, reaction time, and concentration. Sleep disorders, such as insomnia and sleep apnea. Difficulty with: Solving problems. Coping with stress. Paying attention. These issues may affect your performance and productivity at work, school, and home. Lack of sleep may also put you at higher risk for accidents, suicide, and risky behaviors. If you do not get quality sleep, you may also be at higher risk for several health problems, including: Infections. Type 2 diabetes. Heart disease. High blood pressure. Obesity. Worsening of long-term conditions, like arthritis, kidney disease, depression, Parkinson's disease, and epilepsy. What actions can I take to get more quality sleep? Sleep schedule and routine Stick to a sleep schedule. Go to sleep and wake up at about the same time each day. Do not try to sleep less on weekdays and make up for lost sleep on weekends. This does not work. Limit naps during the day to 30 minutes or less. Do not take naps in the late afternoon. Make time to relax before bed. Reading, listening to music, or taking a hot bath promotes quality sleep. Make your bedroom a place that promotes quality sleep. Keep your bedroom dark, quiet, and at a comfortable room temperature. Make sure your bed is comfortable. Avoid using electronic devices that give off bright blue light for 30 minutes before bedtime. Your brain perceives bright blue light  as sunlight. This includes television, phones, and computers. If you are lying awake in bed for longer than 20 minutes, get up and do a relaxing activity until you feel sleepy. Lifestyle     Try to get at least 30 minutes of exercise on most days. Do not exercise 2-3 hours before going to bed. Do not use any products that contain nicotine or tobacco. These products include cigarettes, chewing tobacco, and vaping devices, such as e-cigarettes. If you need help quitting, ask your health care provider. Do not drink caffeinated beverages for at least 8 hours before going to bed. Coffee, tea, and some sodas contain caffeine. Do not drink alcohol or eat large meals close to bedtime. Try to get at least 30 minutes of sunlight every day. Morning sunlight is best. Medical concerns Work with your health care provider to treat medical conditions that may affect sleeping, such as: Nasal obstruction. Snoring. Sleep apnea and other sleep disorders. Talk to your health care provider if you think any of your prescription medicines may cause you to have difficulty falling or staying asleep. If you have sleep problems, talk with a sleep consultant. If you think you have a sleep disorder, talk with your health care provider about getting evaluated by a specialist. Where to find more information Sleep Foundation: sleepfoundation.org American Academy of Sleep Medicine: aasm.org Centers for Disease Control and Prevention (CDC): StoreMirror.com.cy Contact a health care provider if: You have trouble getting to sleep or staying asleep. You often wake up very early in the morning and cannot get back to sleep. You have daytime sleepiness. You  have daytime sleep attacks of suddenly falling asleep and sudden muscle weakness (narcolepsy). You have a tingling sensation in your legs with a strong urge to move your legs (restless legs syndrome). You stop breathing briefly during sleep (sleep apnea). You think you have a sleep  disorder or are taking a medicine that is affecting your quality of sleep. Summary Most adults need 7-8 hours of quality sleep each night. Getting enough quality sleep is important for your mental and physical health. Make your bedroom a place that promotes quality sleep, and avoid things that may cause you to have poor sleep, such as alcohol, caffeine, smoking, or large meals. Talk to your health care provider if you have trouble falling asleep or staying asleep. This information is not intended to replace advice given to you by your health care provider. Make sure you discuss any questions you have with your health care provider. Document Revised: 08/27/2021 Document Reviewed: 08/27/2021 Elsevier Patient Education  2023 Elsevier Inc.  

## 2021-12-30 ENCOUNTER — Telehealth: Payer: Self-pay | Admitting: Neurology

## 2021-12-30 NOTE — Telephone Encounter (Signed)
HT[facility] pending faxed notes 

## 2021-12-31 ENCOUNTER — Ambulatory Visit (INDEPENDENT_AMBULATORY_CARE_PROVIDER_SITE_OTHER): Payer: PPO

## 2021-12-31 DIAGNOSIS — J309 Allergic rhinitis, unspecified: Secondary | ICD-10-CM

## 2022-01-06 ENCOUNTER — Ambulatory Visit
Admission: RE | Admit: 2022-01-06 | Discharge: 2022-01-06 | Disposition: A | Discharge status: Home / Self Care | Payer: PPO | Source: Ambulatory Visit | Attending: *Deleted | Admitting: *Deleted

## 2022-01-06 DIAGNOSIS — Z1231 Encounter for screening mammogram for malignant neoplasm of breast: Secondary | ICD-10-CM

## 2022-01-08 ENCOUNTER — Other Ambulatory Visit: Payer: Self-pay | Admitting: Nurse Practitioner

## 2022-01-08 DIAGNOSIS — R928 Other abnormal and inconclusive findings on diagnostic imaging of breast: Secondary | ICD-10-CM

## 2022-01-12 ENCOUNTER — Ambulatory Visit: Payer: PPO | Admitting: Nurse Practitioner

## 2022-01-12 DIAGNOSIS — R0981 Nasal congestion: Secondary | ICD-10-CM

## 2022-01-12 DIAGNOSIS — R051 Acute cough: Secondary | ICD-10-CM

## 2022-01-12 DIAGNOSIS — U071 COVID-19: Secondary | ICD-10-CM

## 2022-01-12 DIAGNOSIS — R6883 Chills (without fever): Secondary | ICD-10-CM

## 2022-01-12 DIAGNOSIS — R5383 Other fatigue: Secondary | ICD-10-CM

## 2022-01-12 DIAGNOSIS — R0602 Shortness of breath: Secondary | ICD-10-CM | POA: Diagnosis not present

## 2022-01-12 MED ORDER — AZITHROMYCIN 500 MG PO TABS: 500.0000 mg | ORAL_TABLET | Freq: Every day | ORAL | 0 refills | Status: AC

## 2022-01-12 MED ORDER — PREDNISONE 20 MG PO TABS: ORAL_TABLET | ORAL | 0 refills | Status: DC

## 2022-01-12 MED ORDER — PROMETHAZINE-DM 6.25-15 MG/5ML PO SYRP: 5.0000 mL | ORAL_SOLUTION | Freq: Four times a day (QID) | ORAL | 0 refills | Status: DC | PRN

## 2022-01-12 MED ORDER — BENZONATATE 100 MG PO CAPS: ORAL_CAPSULE | ORAL | 0 refills | Status: DC

## 2022-01-12 NOTE — Progress Notes (Signed)
Virtual telephone visit       Assessment & Plan:   1. COVID  - azithromycin (ZITHROMAX) 500 MG tablet; Take 1 tablet (500 mg total) by mouth daily for 10 days. Take 1 tablet daily for 3 days.  Dispense: 10 tablet; Refill: 0  2. Acute cough Discussed Benzonatate may be used during the day and Promethazine cough syrup at night if Promethazine causes drowsiness.  - promethazine-dextromethorphan (PROMETHAZINE-DM) 6.25-15 MG/5ML syrup; Take 5 mLs by mouth 4 (four) times daily as needed for cough.  Dispense: 240 mL; Refill: 0 - benzonatate (TESSALON PERLES) 100 MG capsule; Take 1 capsule (100 mg) TID PRN for cough.  Dispense: 30 capsule; Refill: 0  3. Nasal congestion Continue to stay well hydrated. OTC antihistamine PRN.  4. Chills Continue to monitor for any increase in fever.  5. Other fatigue Rest Push fluids.  6. Short of breath on exertion Continue to monitor for any worsening symptoms. If difficulty breathing call 911 or report to ER.  - predniSONE (DELTASONE) 20 MG tablet; Take 2 tabs (40 mg) for 3 days followed by 1 tab (20 mg) for 4 days.  Dispense: 10 tablet; Refill: 0   Virtual Visit via Telephone Note   This visit type was conducted due to national recommendations for restrictions regarding the COVID-19 Pandemic (e.g. social distancing) in an effort to limit this patient's exposure and mitigate transmission in our community. Due to her co-morbid illnesses, this patient is at least at moderate risk for complications without adequate follow up. This format is felt to be most appropriate for this patient at this time. The patient did not have access to video technology or had technical difficulties with video requiring transitioning to audio format only (telephone). Physical exam was limited to content and character of the telephone converstion.    Patient location: Home Provider location: Office  Patient: Margaret Gilmore   DOB: 11-09-1955   66 y.o. Female  MRN:  [id number] Visit Date: 01/12/2022  Today's Provider: Darrol Jump, NP  Subjective:  Covid Positive   HPI Subjective:     Margaret Gilmore is a 66 y.o. female who presents for evaluation via telephone of symptoms of a URI, Covid positive 01/06/22. Symptoms include achiness, congestion, headache described as pounding, nasal congestion, non productive cough, and shortness of breath. Onset of symptoms was 6 days ago, and has been unchanged since that time. Treatment to date: cough suppressants and decongestants including Dayquil, tylenol.  She gets weekly allergy injections but did not go to her apt last week.     Medications: Outpatient Medications Prior to Visit  Medication Sig   Acetaminophen (TYLENOL PO) Take by mouth.   azelastine (ASTELIN) 0.1 % nasal spray Apply 1 spray each nostril twice a day AS NEEDED for runny nose/drainage down throat.   EPINEPHrine 0.3 mg/0.3 mL IJ SOAJ injection Inject 0.3 mg into the muscle as directed.   esomeprazole (NEXIUM) 40 MG capsule Take 1 capsule (40 mg total) by mouth 2 (two) times daily before a meal.   Polyethylene Glycol 3350 (MIRALAX PO) Take 17 g by mouth as needed.   Rimegepant Sulfate (NURTEC) 75 MG TBDP Take 1 tab for migraine; can repeat in 2 hours if needed.   Ascorbic Acid (VITAMIN C PO) Take 1 tablet by mouth daily. (Patient not taking: Reported on 01/12/2022)   Cholecalciferol (VITAMIN D3) 25 MCG (1000 UT) CAPS Take 1 capsule by mouth daily. (Patient not taking: Reported on 01/12/2022)   Multiple Vitamins-Minerals (  MULTIVITAMIN WITH MINERALS) tablet Take 1 tablet by mouth daily. (Patient not taking: Reported on 01/12/2022)   Omega-3 Fatty Acids (FISH OIL) 1200 MG CAPS Take 2 capsules by mouth daily. (Patient not taking: Reported on 01/12/2022)   OVER THE COUNTER MEDICATION Take 1 tablet by mouth daily. Allergy meds (Patient not taking: Reported on 01/12/2022)   vitamin B-12 (CYANOCOBALAMIN) 500 MCG tablet Take 500 mcg by mouth daily. (Patient  not taking: Reported on 01/12/2022)   No facility-administered medications prior to visit.    Unable to obtain vital signs.       I discussed the assessment and treatment plan with the patient. The patient was provided an opportunity to ask questions and all were answered. The patient agreed with the plan and demonstrated an understanding of the instructions.   The patient was advised to call back or seek an in-person evaluation if the symptoms worsen or if the condition fails to improve as anticipated.  I provided 15 minutes of non-face-to-face time during this encounter.   Darrol Jump, NP  [facility] [phone] (phone) [phone] (fax)

## 2022-01-20 ENCOUNTER — Ambulatory Visit
Admission: RE | Admit: 2022-01-20 | Discharge: 2022-01-20 | Disposition: A | Discharge status: Home / Self Care | Payer: PPO | Source: Ambulatory Visit | Attending: Nurse Practitioner | Admitting: Nurse Practitioner

## 2022-01-20 DIAGNOSIS — R928 Other abnormal and inconclusive findings on diagnostic imaging of breast: Secondary | ICD-10-CM

## 2022-01-20 DIAGNOSIS — R921 Mammographic calcification found on diagnostic imaging of breast: Secondary | ICD-10-CM | POA: Diagnosis not present

## 2022-01-21 NOTE — Telephone Encounter (Signed)
Patient r/s for 02/23/22 at 8 pm.

## 2022-01-21 NOTE — Telephone Encounter (Signed)
Split- HTA auth: [id number] (exp. 12/30/21 to 03/30/22).  Patient is scheduled at [facility] for 02/12/22 at 8 pm.  Mailed packet to the patient.

## 2022-01-23 ENCOUNTER — Ambulatory Visit (INDEPENDENT_AMBULATORY_CARE_PROVIDER_SITE_OTHER): Payer: PPO

## 2022-01-23 DIAGNOSIS — J309 Allergic rhinitis, unspecified: Secondary | ICD-10-CM

## 2022-01-29 ENCOUNTER — Encounter: Payer: Self-pay | Admitting: Nurse Practitioner

## 2022-01-29 ENCOUNTER — Ambulatory Visit
Admission: RE | Admit: 2022-01-29 | Discharge: 2022-01-29 | Disposition: A | Discharge status: Home / Self Care | Payer: PPO | Source: Ambulatory Visit | Attending: Nurse Practitioner | Admitting: Nurse Practitioner

## 2022-01-29 ENCOUNTER — Ambulatory Visit (INDEPENDENT_AMBULATORY_CARE_PROVIDER_SITE_OTHER): Payer: PPO | Admitting: Nurse Practitioner

## 2022-01-29 VITALS — BP 118/74 | HR 78 | Temp 97.5°F | Ht 64.75 in | Wt 183.2 lb

## 2022-01-29 DIAGNOSIS — U099 Post covid-19 condition, unspecified: Secondary | ICD-10-CM

## 2022-01-29 DIAGNOSIS — R059 Cough, unspecified: Secondary | ICD-10-CM | POA: Diagnosis not present

## 2022-01-29 DIAGNOSIS — E538 Deficiency of other specified B group vitamins: Secondary | ICD-10-CM | POA: Diagnosis not present

## 2022-01-29 DIAGNOSIS — H6121 Impacted cerumen, right ear: Secondary | ICD-10-CM | POA: Diagnosis not present

## 2022-01-29 DIAGNOSIS — R5383 Other fatigue: Secondary | ICD-10-CM

## 2022-01-29 DIAGNOSIS — Z136 Encounter for screening for cardiovascular disorders: Secondary | ICD-10-CM

## 2022-01-29 DIAGNOSIS — E669 Obesity, unspecified: Secondary | ICD-10-CM

## 2022-01-29 DIAGNOSIS — I10 Essential (primary) hypertension: Secondary | ICD-10-CM | POA: Diagnosis not present

## 2022-01-29 DIAGNOSIS — R0609 Other forms of dyspnea: Secondary | ICD-10-CM

## 2022-01-29 DIAGNOSIS — Z0001 Encounter for general adult medical examination with abnormal findings: Secondary | ICD-10-CM | POA: Diagnosis not present

## 2022-01-29 DIAGNOSIS — E559 Vitamin D deficiency, unspecified: Secondary | ICD-10-CM

## 2022-01-29 DIAGNOSIS — R6889 Other general symptoms and signs: Secondary | ICD-10-CM

## 2022-01-29 DIAGNOSIS — M950 Acquired deformity of nose: Secondary | ICD-10-CM

## 2022-01-29 DIAGNOSIS — R7309 Other abnormal glucose: Secondary | ICD-10-CM | POA: Diagnosis not present

## 2022-01-29 DIAGNOSIS — F3341 Major depressive disorder, recurrent, in partial remission: Secondary | ICD-10-CM

## 2022-01-29 DIAGNOSIS — Z79899 Other long term (current) drug therapy: Secondary | ICD-10-CM

## 2022-01-29 DIAGNOSIS — H35341 Macular cyst, hole, or pseudohole, right eye: Secondary | ICD-10-CM

## 2022-01-29 DIAGNOSIS — H35342 Macular cyst, hole, or pseudohole, left eye: Secondary | ICD-10-CM

## 2022-01-29 DIAGNOSIS — I4510 Unspecified right bundle-branch block: Secondary | ICD-10-CM

## 2022-01-29 NOTE — Progress Notes (Signed)
CPE  Assessment and Plan:   Encounter for Annual Physical Exam with abnormal findings Due annually  Health Maintenance reviewed Healthy lifestyle reviewed and goals set  Vitamin D deficiency Continue supplement for goal 50-100 range Monitor levels  Hx of prediabetes Education: Reviewed 'ABCs' of diabetes management  Discussed goals to be met and/or maintained include A1C (<7) Blood pressure (<130/80) Cholesterol (LDL <70) Continue Dental Exam Q6 mo Discussed dietary recommendations Discussed Physical Activity recommendations Check and monitor A1C   Obesity (BMI 30.0-34.9) Improving with weight loss.   Down 8 lb. Discussed appropriate BMI Goal of losing 1 lb per month. Diet modification. Physical activity. Encouraged/praised to build confidence.   Macular hole, right eye/left eye Followed by ophthalmology - declining further surgeries at this time - Follows with Dr.  Zigmund Daniel- she will follow with him 02/2022  Medication management All medications discussed and reviewed in full. All questions and concerns regarding medications addressed.    RBBB Asymptomatic Monitor, avoid rate controlling agents Check and monitor EKG  Acquired deformity of nose Discussing with [facility] plastics; avoid nasal steroids/meds - 06/02/2021 to follow up.  06/25/21 will have surgery.  Major depressive disorder, moderate, current ([place]) Refuses further tmt with medications. Reviewed relaxation techniques.  Sleep hygiene. Recommended Cognitive Behavioral Therapy (CBT). Recommended mindfulness meditation and exercise.   Encouraged personality growth wand development through coping techniques and problem-solving skills. Limit/Decrease/Monitor drug/alcohol intake.   Well balanced diet.  B12 Recheck levels, continue supplement as indicated  Hearing loss d/t cerumen impaction Right ear(s) irrigated with lukewarm water, hydrogen peroxide. Large piece of brown cerumen x2 extracted with ear  curette. TM:  WNL.  Pt tolerated procedure well.     Long Covid/Other fatigue/DOE CXR to assess for any underlying disease process. EKG  Continue to monitor  Orders Placed This Encounter  Procedures   DG Chest 2 View    Standing Status:   Future    Standing Expiration Date:   01/30/2023    Order Specific Question:   Reason for Exam (SYMPTOM  OR DIAGNOSIS REQUIRED)    Answer:   s/p Covid Positive 01/09/22, doe, fatigue, cough    Order Specific Question:   Preferred imaging location?    Answer:   [facility]   CBC with Differential/Platelet   COMPLETE METABOLIC PANEL WITH GFR   Magnesium   Lipid panel   TSH   Hemoglobin A1c   VITAMIN D 25 Hydroxy (Vit-D Deficiency, Fractures)   Urinalysis, Routine w reflex microscopic   Vitamin B12   EKG 12-Lead    Standing Status:   Future    Standing Expiration Date:   01/30/2023   Ear Lavage    Notify office for further evaluation and treatment, questions or concerns if any reported s/s fail to improve.   The patient was advised to call back or seek an in-person evaluation if any symptoms worsen or if the condition fails to improve as anticipated.   Further disposition pending results of labs. Discussed med's effects and SE's.   I discussed the assessment and treatment plan with the patient. The patient was provided an opportunity to ask questions and all were answered. The patient agreed with the plan and demonstrated an understanding of the instructions.  Discussed med's effects and SE's. Screening labs and tests as requested with regular follow-up as recommended.  I provided 30 minutes of face-to-face time during this encounter including counseling, chart review, and critical decision making was preformed.'  Future Appointments  Date Time Provider [place]  02/23/2022  8:00 PM [facility] None  03/11/2022  9:15 AM [name], MD [facility] None  07/21/2022 11:00 AM Darrol Jump, NP [facility] None   02/01/2023 10:00 AM Darrol Jump, NP [facility] None     Plan:   During the course of the visit the patient was educated and counseled about appropriate screening and preventive services including:   Pneumococcal vaccine  Prevnar 13 Influenza vaccine Td vaccine Screening electrocardiogram Bone densitometry screening Colorectal cancer screening Diabetes screening Glaucoma screening Nutrition counseling  Advanced directives: requested    HPI  66 y.o. female  presents for CPE. She  has Depression, major, recurrent ([place]); Vitamin D deficiency; Anxiety; Other abnormal glucose (hx of prediabetes); Macular hole, left eye; Macular hole, right eye; Obesity (BMI 30.0-34.9); Right bundle branch block (RBBB); Chronic rhinitis; Nasal deformity, acquired; Hyperlipidemia; Non-restorative sleep; Uncontrolled morning headache; Acquired facial deformity; GERD with stricture; Depression; and Excessive daytime sleepiness on their problem list.   She is single. She is currently out of work due to multiple eye procedures (macular holes, surgery x 3), no longer driving, daughter or mom drives her if needed. Previously worked as Building control surveyor at [employer].   She has recently gotten over Covid 19 01/09/22.  She is continuing to feel fatigued, unable to work our as normal with DOE.  She does have a hx of RBBB. Denies palpitations.    She has hx of depression/anxiety, newly following with therapist and is being referred to psychiatrist for medication management for OCD behaviors.  She is no longer taking Lexapro or Wellbutrin d/t SE.  Made her "lay around all the time."  She is trying to focus on lifestyle modifications only.  She has acquired nasal deformity following steroid injections remotely for possible discoid lupus, now has chronic deformity, nasal discharge and nasal crusting, saw ENT in [facility] [name], was referred to cosmetic surgeon and allergist.  Some question of habitual self  excoriation of nose, OCD tendencies, was referred for therapy and pscyh. Hx of depression/anxiety as well. She was advised need for complete therapy (1 year) before she can have plastic surgery [name] at [facility]. She will follow up with initial surgical consultation 05/2021.   Has history of GERD and has had EGD/dilitation in 2015 and again in 2019. She reports manages by avoiding trigger foods.   BMI is Body mass index is 30.72 kg/m., she has been working on diet and exercise, but has been more fatigued since having Covid.  She has lost 8 lb in the last month. Wt Readings from Last 3 Encounters:  01/29/22 183 lb 3.2 oz (83.1 kg)  12/29/21 189 lb (85.7 kg)  11/24/21 202 lb (91.6 kg)   Today their BP is BP: 118/74 She does workout. She denies chest pain, shortness of breath, dizziness.   She is not on cholesterol medication and denies myalgias. Her cholesterol is not at goal. The cholesterol last visit was:   Lab Results  Component Value Date   CHOL 219 (H) 06/26/2021   HDL 50 06/26/2021   LDLCALC 134 (H) 06/26/2021   TRIG 213 (H) 06/26/2021   CHOLHDL 4.4 06/26/2021   She has been working on diet and exercise for hx oprediabetes, she is not on bASA, she is not on ACE/ARB and denies foot ulcerations, increased appetite, nausea, paresthesia of the feet, polydipsia, polyuria, vomiting and weight loss. Last A1C in the office was:  Lab Results  Component Value Date   HGBA1C 5.6 01/29/2021  Last GFR: Lab Results  Component Value Date   GFRNONAA 80 07/10/2020   Patient is on Vitamin D supplement, taking 2000 IU daily   Lab Results  Component Value Date   VD25OH 51 09/02/2021     She takes chewable supplement daily, feels helps her energy, has done shots in the past Lab Results  Component Value Date   VITAMINB12 >2,000 (H) 09/02/2021     Current Medications:  Current Outpatient Medications on File Prior to Visit  Medication Sig Dispense Refill   Acetaminophen (TYLENOL  PO) Take by mouth.     Ascorbic Acid (VITAMIN C PO) Take 1 tablet by mouth daily.     azelastine (ASTELIN) 0.1 % nasal spray Apply 1 spray each nostril twice a day AS NEEDED for runny nose/drainage down throat. 30 mL 5   benzonatate (TESSALON PERLES) 100 MG capsule Take 1 capsule (100 mg) TID PRN for cough. 30 capsule 0   Cholecalciferol (VITAMIN D3) 25 MCG (1000 UT) CAPS Take 1 capsule by mouth daily.     EPINEPHrine 0.3 mg/0.3 mL IJ SOAJ injection Inject 0.3 mg into the muscle as directed.     esomeprazole (NEXIUM) 40 MG capsule Take 1 capsule (40 mg total) by mouth 2 (two) times daily before a meal. 60 capsule 11   Multiple Vitamins-Minerals (MULTIVITAMIN WITH MINERALS) tablet Take 1 tablet by mouth daily.     Omega-3 Fatty Acids (FISH OIL) 1200 MG CAPS Take 2 capsules by mouth daily.     OVER THE COUNTER MEDICATION Take 1 tablet by mouth daily. Allergy meds     Polyethylene Glycol 3350 (MIRALAX PO) Take 17 g by mouth as needed.     promethazine-dextromethorphan (PROMETHAZINE-DM) 6.25-15 MG/5ML syrup Take 5 mLs by mouth 4 (four) times daily as needed for cough. 240 mL 0   Rimegepant Sulfate (NURTEC) 75 MG TBDP Take 1 tab for migraine; can repeat in 2 hours if needed. 2 tablet 0   vitamin B-12 (CYANOCOBALAMIN) 500 MCG tablet Take 500 mcg by mouth daily.     predniSONE (DELTASONE) 20 MG tablet Take 2 tabs (40 mg) for 3 days followed by 1 tab (20 mg) for 4 days. 10 tablet 0   No current facility-administered medications on file prior to visit.   Allergies:  Allergies  Allergen Reactions   Pregabalin Swelling   Diphenhydramine Nausea And Vomiting    Severe headaches  Severe headaches  Severe headaches    Nitrous Oxide Other (See Comments)    Pt has gas bubble in right eye- will cause blindness per pt  Pt has gas bubble in right eye- will cause blindness per pt  Pt has gas bubble in right eye- will cause blindness per pt    Sulfa Antibiotics Other (See Comments)    Headache   Medical  History:  She has Depression, major, recurrent ([place]); Vitamin D deficiency; Anxiety; Other abnormal glucose (hx of prediabetes); Macular hole, left eye; Macular hole, right eye; Obesity (BMI 30.0-34.9); Right bundle branch block (RBBB); Chronic rhinitis; Nasal deformity, acquired; Hyperlipidemia; Non-restorative sleep; Uncontrolled morning headache; Acquired facial deformity; GERD with stricture; Depression; and Excessive daytime sleepiness on their problem list. Health Maintenance:   Immunization History  Administered Date(s) Administered   Influenza, High Dose Seasonal PF 06/26/2021   Influenza-Unspecified 02/15/2018, 02/16/2020   PPD Test 05/31/2013   Pneumococcal Polysaccharide-23 07/10/2020   Tdap 05/31/2013    Tetanus: 2015 Pneumovax: 06/2020 Prevnar 13: defer Flu vaccine: Due 02/2022 Shingrix: check with insurance  Covid  19: declines  Pap: Remote, never abnormal, s/p TAH, declines further unless problems MGM: 01/20/2022 Negative DEXA: 09/2010 Continues to defer   Colonoscopy: 2015, Dr. Fuller Plan, 10 year recall  EGD: 2019  Last Dental Exam: Dr. Luan Pulling in [place], last visit 2020, encouraged to schedule.   Last Eye Exam: Dr. Tempie Hoist, [facility], last visit 2022, goes q58mFollows with Opthalmology and will see next month 02/2022.  Patient Care Team: MUnk Pinto MD as PCP - General (Internal Medicine)  Surgical History:  She has a past surgical history that includes Abdominal hysterectomy; Cesarean section; trigeminal ; Appendectomy; knot removed from right side of abdomen; Colonoscopy; 25 gauge pars plana vitrectomy with 20 gauge mvr port for macular hole (Left, 03/05/2015); Membrane peel (Left, 03/05/2015); Serum patch (Left, 03/05/2015); Laser photo ablation (Left, 03/05/2015); Gas/fluid exchange (Left, 03/05/2015); 25 gauge pars plana vitrectomy with 20 gauge mvr port for macular hole (Right, 06/22/2017); 25 gauge pars plana vitrectomy with 20  gauge mvr port for macular hole (Right, 06/22/2017); Gas/fluid exchange (Right, 06/22/2017); Laser photo ablation (Right, 06/22/2017); Serum patch (Right, 06/22/2017); Membrane peel (Right, 06/22/2017); Scleral buckle with possible 25 gauge pars plana vitrectomy (Right, 07/27/2017); Laser photo ablation (Right, 07/27/2017); Gas/fluid exchange (Right, 07/27/2017); Membrane peel (Right, 07/27/2017); 25 gauge pars plana vitrectomy with 20 gauge mvr port (Left, 11/09/2017); Esophagogastroduodenoscopy (egd) with esophageal dilation; Eye surgery; 25 gauge pars plana vitrectomy with 20 gauge mvr port (Left, 11/09/2017); Membrane peel (Left, 11/09/2017); Gas/fluid exchange (Left, 11/09/2017); Serum patch (Left, 11/09/2017); and Laser photo ablation (Left, 11/09/2017). Family History:  Herfamily history includes Alzheimer's disease in her maternal aunt and maternal grandmother; Bone cancer in her maternal grandfather; Breast cancer in her maternal aunt; COPD in her mother; Cancer in her paternal uncle; Colon cancer in her paternal uncle; Diabetes in her mother; Heart disease in her father; Hodgkin's lymphoma in her paternal aunt; Hyperlipidemia in her father and mother; Hypertension in her brother and father; Pancreatic cancer (age of onset: 631 in her father; Prostate cancer in her maternal grandfather. Social History:  She reports that she has never smoked. She has been exposed to tobacco smoke. She has never used smokeless tobacco. She reports that she does not drink alcohol and does not use drugs.   Review of Systems: Review of Systems  Constitutional:  Negative for malaise/fatigue and weight loss.  HENT:  Negative for hearing loss and tinnitus.        Chronic rhinitis  Eyes:  Negative for blurred vision and double vision.  Respiratory:  Negative for cough, sputum production, shortness of breath and wheezing.   Cardiovascular:  Negative for chest pain, palpitations, orthopnea, claudication, leg swelling and PND.   Gastrointestinal:  Negative for abdominal pain, blood in stool, constipation, diarrhea, heartburn, melena, nausea and vomiting.  Genitourinary: Negative.   Musculoskeletal:  Negative for falls, joint pain and myalgias.  Skin:  Negative for rash.  Neurological:  Negative for dizziness, tingling, sensory change, weakness and headaches.  Endo/Heme/Allergies:  Negative for environmental allergies and polydipsia.  Psychiatric/Behavioral:  Positive for depression. Negative for memory loss, substance abuse and suicidal ideas. The patient is not nervous/anxious and does not have insomnia.   All other systems reviewed and are negative.   Physical Exam: Estimated body mass index is 30.72 kg/m as calculated from the following:   Height as of this encounter: 5' 4.75" (1.645 m).   Weight as of this encounter: 183 lb 3.2 oz (83.1 kg). BP 118/74   Pulse 78  Temp (!) 97.5 F (36.4 C)   Ht 5' 4.75" (1.645 m)   Wt 183 lb 3.2 oz (83.1 kg)   SpO2 97%   BMI 30.72 kg/m  General Appearance: Well nourished, well dressed elder female, in no apparent distress.  Eyes: R pupil minimally reactive, left round and reactive, EOMs, conjunctiva no swelling or erythema Sinuses: No Frontal/maxillary tenderness  ENT/Mouth: Ext aud canals clear, normal light reflex with TMs without erythema, bulging. Good dentition. No erythema, swelling, or exudate on post pharynx. Tonsils not swollen or erythematous. Hearing normal. R nare chronically deformed/absent; no open wounds or ulcerations; clear crusty discharge.  Neck: Supple, thyroid normal. No bruits  Respiratory: Respiratory effort normal, BS equal bilaterally without rales, rhonchi, wheezing or stridor.  Cardio: RRR without murmurs, rubs or gallops. Brisk peripheral pulses without edema.  Chest: symmetric, with normal excursions and percussion.  Abdomen: Soft, nontender, no guarding, rebound, hernias, masses, or organomegaly.  Lymphatics: Non tender without  lymphadenopathy.  Musculoskeletal: Full ROM all peripheral extremities,5/5 strength, and normal gait.  Skin: Warm, dry without rashes, lesions, ecchymosis. 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 Normal muscle tone, no cerebellar symptoms. Sensation intact.  Psych: Awake and oriented X 3, normal affect, Insight and Judgment appropriate.  Breasts: recent mammogram, no concerns, defer GU: s/p TAH, denies concerns, defer  EKG: NSR, IRBBB    Vaniyah Lansky 10:06 AM [facility]

## 2022-01-29 NOTE — Patient Instructions (Signed)
Shortness of Breath, Adult Shortness of breath means you have trouble breathing. Shortness of breath could be a sign of a medical problem. Follow these instructions at home:  Pollution Do not smoke or use any products that contain nicotine or tobacco. If you need help quitting, ask your doctor. Avoid things that can make it harder to breathe, such as: Smoke of all kinds. This includes smoke from campfires or forest fires. Do not smoke or allow others to smoke in your home. Mold. Dust. Air pollution. Chemical smells. Things that can give you an allergic reaction (allergens) if you have allergies. Keep your living space clean. Use products that help remove mold and dust. General instructions Watch for any changes in your symptoms. Take over-the-counter and prescription medicines only as told by your doctor. This includes oxygen therapy and inhaled medicines. Rest as needed. Return to your normal activities when your doctor says that it is safe. Keep all follow-up visits. Contact a doctor if: Your condition does not get better as soon as expected. You have a hard time doing your normal activities, even after you rest. You have new symptoms. You cannot walk up stairs. You cannot exercise the way you normally do. Get help right away if: Your shortness of breath gets worse. You have trouble breathing when you are resting. You feel light-headed or you faint. You have a cough that is not helped by medicines. You cough up blood. You have pain with breathing. You have pain in your chest, arms, shoulders, or belly (abdomen). You have a fever. These symptoms may be an emergency. Get help right away. Call 911. Do not wait to see if the symptoms will go away. Do not drive yourself to the hospital. Summary Shortness of breath is when you have trouble breathing enough air. It can be a sign of a medical problem. Avoid things that make it hard for you to breathe, such as smoking, pollution,  mold, and dust. Watch for any changes in your symptoms. Contact your doctor if you do not get better or you get worse. This information is not intended to replace advice given to you by your health care provider. Make sure you discuss any questions you have with your health care provider. Document Revised: 12/21/2020 Document Reviewed: 12/21/2020 Elsevier Patient Education  2023 Elsevier Inc.  

## 2022-01-30 ENCOUNTER — Ambulatory Visit (INDEPENDENT_AMBULATORY_CARE_PROVIDER_SITE_OTHER): Payer: PPO

## 2022-01-30 DIAGNOSIS — J309 Allergic rhinitis, unspecified: Secondary | ICD-10-CM

## 2022-01-30 LAB — COMPLETE METABOLIC PANEL WITH GFR
AG Ratio: 1.8 (calc) (ref 1.0–2.5)
ALT: 21 U/L (ref 6–29)
AST: 16 U/L (ref 10–35)
Albumin: 4.4 g/dL (ref 3.6–5.1)
Alkaline phosphatase (APISO): 53 U/L (ref 37–153)
BUN: 22 mg/dL (ref 7–25)
CO2: 23 mmol/L (ref 20–32)
Calcium: 9.5 mg/dL (ref 8.6–10.4)
Chloride: 106 mmol/L (ref 98–110)
Creat: 0.75 mg/dL (ref 0.50–1.05)
Globulin: 2.5 g/dL (calc) (ref 1.9–3.7)
Glucose, Bld: 79 mg/dL (ref 65–99)
Potassium: 4.5 mmol/L (ref 3.5–5.3)
Sodium: 139 mmol/L (ref 135–146)
Total Bilirubin: 0.4 mg/dL (ref 0.2–1.2)
Total Protein: 6.9 g/dL (ref 6.1–8.1)
eGFR: 87 mL/min/{1.73_m2} (ref >=60)

## 2022-01-30 LAB — MAGNESIUM: Magnesium: 2.1 mg/dL (ref 1.5–2.5)

## 2022-01-30 LAB — TSH: TSH: 1.13 mIU/L (ref 0.40–4.50)

## 2022-01-30 LAB — CBC WITH DIFFERENTIAL/PLATELET
Absolute Monocytes: 462 cells/uL (ref 200–950)
Basophils Absolute: 41 cells/uL (ref 0–200)
Basophils Relative: 0.6 %
Eosinophils Absolute: 82 cells/uL (ref 15–500)
Eosinophils Relative: 1.2 %
HCT: 40.1 % (ref 35.0–45.0)
Hemoglobin: 13.7 g/dL (ref 11.7–15.5)
Lymphs Abs: 2149 cells/uL (ref 850–3900)
MCH: 30.2 pg (ref 27.0–33.0)
MCHC: 34.2 g/dL (ref 32.0–36.0)
MCV: 88.3 fL (ref 80.0–100.0)
MPV: 10.5 fL (ref 7.5–12.5)
Monocytes Relative: 6.8 %
Neutro Abs: 4066 cells/uL (ref 1500–7800)
Neutrophils Relative %: 59.8 %
Platelets: 332 10*3/uL (ref 140–400)
RBC: 4.54 10*6/uL (ref 3.80–5.10)
RDW: 13.3 % (ref 11.0–15.0)
Total Lymphocyte: 31.6 %
WBC: 6.8 10*3/uL (ref 3.8–10.8)

## 2022-01-30 LAB — HEMOGLOBIN A1C
Hgb A1c MFr Bld: 5.7 % of total Hgb — ABNORMAL HIGH (ref <5.7)
Mean Plasma Glucose: 117 mg/dL
eAG (mmol/L): 6.5 mmol/L

## 2022-01-30 LAB — VITAMIN D 25 HYDROXY (VIT D DEFICIENCY, FRACTURES): Vit D, 25-Hydroxy: 71 ng/mL (ref 30–100)

## 2022-01-30 LAB — URINALYSIS, ROUTINE W REFLEX MICROSCOPIC
Bilirubin Urine: NEGATIVE
Glucose, UA: NEGATIVE
Hgb urine dipstick: NEGATIVE
Ketones, ur: NEGATIVE
Leukocytes,Ua: NEGATIVE
Nitrite: NEGATIVE
Protein, ur: NEGATIVE
Specific Gravity, Urine: 1.01 (ref 1.001–1.035)
pH: 6 (ref 5.0–8.0)

## 2022-01-30 LAB — LIPID PANEL
Cholesterol: 187 mg/dL (ref <200)
HDL: 49 mg/dL — ABNORMAL LOW (ref >=50)
LDL Cholesterol (Calc): 111 mg/dL (calc) — ABNORMAL HIGH
Non-HDL Cholesterol (Calc): 138 mg/dL (calc) — ABNORMAL HIGH (ref <130)
Total CHOL/HDL Ratio: 3.8 (calc) (ref <5.0)
Triglycerides: 154 mg/dL — ABNORMAL HIGH (ref <150)

## 2022-01-30 LAB — VITAMIN B12: Vitamin B-12: 1880 pg/mL — ABNORMAL HIGH (ref 200–1100)

## 2022-01-30 NOTE — Addendum Note (Signed)
Addended by: Chancy Hurter on: 01/30/2022 10:52 AM   Modules accepted: Orders

## 2022-02-06 ENCOUNTER — Ambulatory Visit (INDEPENDENT_AMBULATORY_CARE_PROVIDER_SITE_OTHER): Payer: PPO

## 2022-02-06 DIAGNOSIS — J309 Allergic rhinitis, unspecified: Secondary | ICD-10-CM | POA: Diagnosis not present

## 2022-02-11 ENCOUNTER — Ambulatory Visit (INDEPENDENT_AMBULATORY_CARE_PROVIDER_SITE_OTHER): Payer: PPO

## 2022-02-11 DIAGNOSIS — J309 Allergic rhinitis, unspecified: Secondary | ICD-10-CM

## 2022-02-16 NOTE — Progress Notes (Signed)
VIALS EXP 02-17-23 

## 2022-02-17 DIAGNOSIS — J3081 Allergic rhinitis due to animal (cat) (dog) hair and dander: Secondary | ICD-10-CM

## 2022-02-18 ENCOUNTER — Ambulatory Visit: Payer: Self-pay

## 2022-02-18 DIAGNOSIS — J301 Allergic rhinitis due to pollen: Secondary | ICD-10-CM | POA: Diagnosis not present

## 2022-02-18 DIAGNOSIS — J309 Allergic rhinitis, unspecified: Secondary | ICD-10-CM

## 2022-02-23 ENCOUNTER — Ambulatory Visit (INDEPENDENT_AMBULATORY_CARE_PROVIDER_SITE_OTHER): Payer: PPO | Admitting: Neurology

## 2022-02-23 DIAGNOSIS — G4719 Other hypersomnia: Secondary | ICD-10-CM

## 2022-02-23 DIAGNOSIS — R519 Headache, unspecified: Secondary | ICD-10-CM

## 2022-02-23 DIAGNOSIS — G478 Other sleep disorders: Secondary | ICD-10-CM

## 2022-02-23 DIAGNOSIS — K219 Gastro-esophageal reflux disease without esophagitis: Secondary | ICD-10-CM

## 2022-02-23 DIAGNOSIS — F32A Depression, unspecified: Secondary | ICD-10-CM

## 2022-02-23 DIAGNOSIS — M952 Other acquired deformity of head: Secondary | ICD-10-CM

## 2022-02-25 NOTE — Procedures (Signed)
[facility] POLYSOMNOGRAPHY  INTERPRETATION REPORT   STUDY DATE:  02/23/2022     PATIENT NAME:  Margaret Gilmore         DATE OF BIRTH:  1955/07/10  PATIENT ID:  [id number]    TYPE OF STUDY:  PSG  READING PHYSICIAN: Larey Seat, MD REFERRED BY: Lorenda Peck, NP / Dr Unk Pinto, MD  SCORING TECHNICIAN: Richard Miu, RPSGT   HISTORY:  Margaret Gilmore, a 66 year-old Female patient was seen on 12-29-2021 and has a chief complaint of daytime fatigue, stating she is waking up more tired than when she went to sleep.  Past medical history:  Trigeminal neuralgia surgery with complications, remaining deformity of the nose and face. ADDITIONAL INFORMATION:  The Epworth Sleepiness Scale endorsed at 12 /24 points (scores above or equal to 10 are suggestive of hypersomnolence). FSS endorsed at   /63 points. GDS at 7/ 15 points.   Height: 65 in Weight: 189 lb (BMI 31) Neck Size: 15 in  MEDICATIONS: Vitamin C, Astelin, Vitamin D3, Epinephrine, Nexium, Multivitamins, Fish Oil, Miralax, Nurtec, Vitamin B 12 TECHNICAL DESCRIPTION: A registered sleep technologist (RPSGT)  was in attendance for the duration of the recording.  Data collection, scoring, video monitoring, and reporting were performed in compliance with the AASM Manual for the Scoring of Sleep and Associated Events; (Hypopnea is scored based on the criteria listed in Section VIII D. 1b in the AASM Manual V2.6 using a 4% oxygen desaturation rule or Hypopnea is scored based on the criteria listed in Section VIII D. 1a in the AASM Manual V2.6 using 3% oxygen desaturation and /or arousal rule).   SLEEP CONTINUITY AND SLEEP ARCHITECTURE:  Lights-out was at 20:50: and lights-on at  05:16:, with  8.4 hours ( 495 minutes) of recording time. Total sleep time (TST) was 283.0 minutes with a decreased sleep efficiency at 55.9%.  Sleep latency was increased at 55.5 minutes.  REM sleep latency was increased at 284.5  minutes. Of the total sleep time, the percentage of stage N1 sleep was 4.4%, stage N2 sleep was 68%, stage N3 sleep was 13.6%, and REM sleep was 13.8%. BODY POSITION:  TST was divided between the following sleep positions: Sleep in supine: 59 minutes (21%), in  non-supine 224 minutes (79%); on the right 204 minutes (72%), on the  left 19 minutes (7%), and prone 00 minutes (0%). Total supine REM sleep time was 00 minutes (0% of total REM sleep). There were 2 Stage R periods observed on this study night, 17 awakenings (i.e. transitions to Stage W from any sleep stage), and 60 total stage transitions.  Wake after sleep onset (WASO) time accounted for 167 minutes.   RESPIRATORY MONITORING:  Based on CMS criteria (using a 4% oxygen desaturation rule for scoring hypopneas), there were 3 apneas (3 obstructive; 0 central; 0 mixed), and 24 hypopneas.  Apnea index was 0.6/h. Hypopnea index was 5.1/h.  The apnea-hypopnea index was 5.7/h overall: AHI of 20.2/h in supine, AHI of 3/h in non-supine; 3.1/h AHI in REM. There were 0 respiratory effort-related arousals (RERAs).  Mild to moderate snoring was captured.   OXIMETRY: Oxyhemoglobin Saturation Nadir during sleep was at 88%, down from a mean of 94%.  Of the Total sleep time (TST) ,  hypoxemia (<89%) was present for 0.0 minutes, or 0.0% of total sleep time.  LIMB MOVEMENTS: There were 0 periodic limb movements of sleep (PLM) . AROUSAL: There were 21 arousals in total, for an  arousal index of 4 arousals/hour.  Of these, 3 were identified as respiratory-related arousals (1 /h), 0 were PLM-related arousals (0 /h), and 25 were non-specific arousals (5 /h). EEG:  PSG EEG was of normal amplitude and frequency, with symmetric manifestation of sleep stages. EKG: The electrocardiogram documented NSR.  The average heart rate during sleep was 59 bpm.  The heart rate during sleep varied between 52 bpm and  70 bpm. AUDIO and VIDEO: These were unremarkable.  IMPRESSION: Sleep  disordered breathing was present- There was mild Obstructive Sleep apnea at AHI of only 5.6 /h and very much dependent on supine position.   No hypoxia of clinical significance. I was not able to explain the described morning headaches.  3) Sleep efficiency was poor at 56%.  Sleep fragmentation was noted- The majority of sleep arousals was related to non-physiological factors.     RECOMMENDATIONS: Snoring and mild apnea were supine positional dependent. My first recommendation is to avoid spine sleep , sleep with a 10- 12 degree elevated head of bed, and reduce OTC headache medication intake.  I would not treat this mild apnea with PAP, rather with a dental device should positional care not help.  Larey Seat, MD

## 2022-02-26 ENCOUNTER — Encounter: Payer: Self-pay | Admitting: Neurology

## 2022-02-27 ENCOUNTER — Ambulatory Visit (INDEPENDENT_AMBULATORY_CARE_PROVIDER_SITE_OTHER): Payer: PPO

## 2022-02-27 DIAGNOSIS — J309 Allergic rhinitis, unspecified: Secondary | ICD-10-CM

## 2022-03-04 ENCOUNTER — Ambulatory Visit (INDEPENDENT_AMBULATORY_CARE_PROVIDER_SITE_OTHER): Payer: PPO

## 2022-03-04 DIAGNOSIS — J309 Allergic rhinitis, unspecified: Secondary | ICD-10-CM

## 2022-03-11 ENCOUNTER — Encounter (INDEPENDENT_AMBULATORY_CARE_PROVIDER_SITE_OTHER): Payer: PPO | Admitting: Ophthalmology

## 2022-03-11 DIAGNOSIS — H35341 Macular cyst, hole, or pseudohole, right eye: Secondary | ICD-10-CM | POA: Diagnosis not present

## 2022-03-11 DIAGNOSIS — H338 Other retinal detachments: Secondary | ICD-10-CM

## 2022-03-13 ENCOUNTER — Ambulatory Visit (INDEPENDENT_AMBULATORY_CARE_PROVIDER_SITE_OTHER): Payer: PPO

## 2022-03-13 DIAGNOSIS — J309 Allergic rhinitis, unspecified: Secondary | ICD-10-CM | POA: Diagnosis not present

## 2022-03-20 ENCOUNTER — Ambulatory Visit (INDEPENDENT_AMBULATORY_CARE_PROVIDER_SITE_OTHER): Payer: PPO

## 2022-03-20 DIAGNOSIS — J309 Allergic rhinitis, unspecified: Secondary | ICD-10-CM | POA: Diagnosis not present

## 2022-03-25 ENCOUNTER — Ambulatory Visit (INDEPENDENT_AMBULATORY_CARE_PROVIDER_SITE_OTHER): Payer: PPO

## 2022-03-25 DIAGNOSIS — J309 Allergic rhinitis, unspecified: Secondary | ICD-10-CM

## 2022-04-08 ENCOUNTER — Ambulatory Visit (INDEPENDENT_AMBULATORY_CARE_PROVIDER_SITE_OTHER): Payer: PPO

## 2022-04-08 DIAGNOSIS — J309 Allergic rhinitis, unspecified: Secondary | ICD-10-CM | POA: Diagnosis not present

## 2022-04-15 ENCOUNTER — Ambulatory Visit (INDEPENDENT_AMBULATORY_CARE_PROVIDER_SITE_OTHER): Payer: PPO

## 2022-04-15 DIAGNOSIS — J309 Allergic rhinitis, unspecified: Secondary | ICD-10-CM

## 2022-04-24 ENCOUNTER — Ambulatory Visit (INDEPENDENT_AMBULATORY_CARE_PROVIDER_SITE_OTHER): Payer: PPO

## 2022-04-24 DIAGNOSIS — J309 Allergic rhinitis, unspecified: Secondary | ICD-10-CM | POA: Diagnosis not present

## 2022-04-29 ENCOUNTER — Ambulatory Visit (INDEPENDENT_AMBULATORY_CARE_PROVIDER_SITE_OTHER): Payer: PPO

## 2022-04-29 ENCOUNTER — Telehealth: Payer: Self-pay | Admitting: Nurse Practitioner

## 2022-04-29 DIAGNOSIS — J309 Allergic rhinitis, unspecified: Secondary | ICD-10-CM

## 2022-04-29 NOTE — Telephone Encounter (Signed)
Patient states that she only takes the Bupropion 2-3 weeks at a time and then takes a break from it. Please advise

## 2022-04-29 NOTE — Telephone Encounter (Signed)
PATIENT IS REQUESTING A REFILL ON WELBUTRIN TO [facility].

## 2022-04-30 ENCOUNTER — Encounter: Payer: Self-pay | Admitting: Nurse Practitioner

## 2022-05-19 ENCOUNTER — Ambulatory Visit (INDEPENDENT_AMBULATORY_CARE_PROVIDER_SITE_OTHER): Payer: PPO | Admitting: Nurse Practitioner

## 2022-05-19 ENCOUNTER — Encounter: Payer: Self-pay | Admitting: Nurse Practitioner

## 2022-05-19 VITALS — BP 116/72 | HR 77 | Temp 97.5°F | Ht 64.75 in | Wt 175.4 lb

## 2022-05-19 DIAGNOSIS — E669 Obesity, unspecified: Secondary | ICD-10-CM | POA: Diagnosis not present

## 2022-05-19 DIAGNOSIS — F3341 Major depressive disorder, recurrent, in partial remission: Secondary | ICD-10-CM | POA: Diagnosis not present

## 2022-05-19 DIAGNOSIS — J310 Chronic rhinitis: Secondary | ICD-10-CM | POA: Diagnosis not present

## 2022-05-19 DIAGNOSIS — Z79899 Other long term (current) drug therapy: Secondary | ICD-10-CM

## 2022-05-19 DIAGNOSIS — F419 Anxiety disorder, unspecified: Secondary | ICD-10-CM | POA: Diagnosis not present

## 2022-05-19 MED ORDER — BUPROPION HCL ER (XL) 150 MG PO TB24: 150.0000 mg | ORAL_TABLET | ORAL | 0 refills | Status: DC

## 2022-05-19 MED ORDER — AZITHROMYCIN 500 MG PO TABS: 500.0000 mg | ORAL_TABLET | Freq: Every day | ORAL | 0 refills | Status: AC

## 2022-05-19 NOTE — Progress Notes (Signed)
Assessment and Plan:  Margaret Gilmore was seen today for a follow up.  Diagnoses and all order for this visit:  Recurrent major depressive disorder, in partial remission (HCC) Restart Wellbutrin  - buPROPion (WELLBUTRIN XL) 150 MG 24 hr tablet; Take 1 tablet (150 mg total) by mouth every morning.  Dispense: 30 tablet; Refill: 0  Medication management All medications discussed and reviewed in full. All questions and concerns regarding medications addressed.    Anxiety Reviewed relaxation techniques.  Sleep hygiene. Recommended mindfulness meditation and exercise.   Encouraged personality growth wand development through coping techniques and problem-solving skills. Limit/Decrease/Monitor drug/alcohol intake.    - buPROPion (WELLBUTRIN XL) 150 MG 24 hr tablet; Take 1 tablet (150 mg total) by mouth every morning.  Dispense: 30 tablet; Refill: 0  Obesity (BMI 30.0-34.9) Discussed appropriate BMI Goal of losing 1 lb per month. Diet modification. Physical activity. Encouraged/praised to build confidence.   Chronic rhinitis Stay well hydrated to keep mucus thin and productive. Suggested daily supplements to include zinc, vitamin d, vitamin c  - azithromycin (ZITHROMAX) 500 MG tablet; Take 1 tablet (500 mg total) by mouth daily for 10 days.  Dispense: 10 tablet; Refill: 0   Notify office for further evaluation and treatment, questions or concerns if s/s fail to improve. The risks and benefits of my recommendations, as well as other treatment options were discussed with the patient today. Questions were answered.  Further disposition pending results of labs. Discussed med's effects and SE's.    Over 20 minutes of exam, counseling, chart review, and critical decision making was performed.   Future Appointments  Date Time Provider [place]  07/21/2022 11:00 AM Darrol Jump, NP [facility] None  02/01/2023 10:00 AM Darrol Jump, NP [facility] None  03/18/2023 12:45 PM  [name], MD [facility] None    ------------------------------------------------------------------------------------------------------------------   HPI BP 116/72   Pulse 77   Temp (!) 97.5 F (36.4 C)   Ht 5' 4.75" (1.645 m)   Wt 175 lb 6.4 oz (79.6 kg)   SpO2 92%   BMI 29.41 kg/m   66 y.o.female presents for medication management.  Reports that she stopped taking Lexapro several months ago.  She had  no SE from stopping the medication and has felt balanced in her mood over the last several months while off the medication.  She noticed one month ago that her symptoms returned to feeling down and depressed.  She does not have the drive to continue to work out and eta healthy.  This is allowed her to gain weight which is adding to her depressed mood.  She denies any HI/SI, extreme mood swings.    Patient complains of symptoms of a URI, possible sinusitis. Symptoms include achiness, congestion, facial pain, nasal congestion, post nasal drip, sinus pressure, and tooth pain. Onset of symptoms was 10 days ago, and has been unchanged since that time. Treatment to date: antihistamines and decongestants.  She denies fever, chills, N/V.   Past Medical History:  Diagnosis Date   Allergy    Anxiety    situational   Arthritis    B12 deficiency    hx of , no longer taking medication   Cataracts, bilateral    Constipation    Depression    situational   Esophageal stricture    Family history of adverse reaction to anesthesia    mom had problems waking up   Fatty liver    GERD (gastroesophageal reflux disease)    Headache  migraines    Macular hole of left eye    Retinal detachment with multiple breaks, right 07/27/2017   Trigeminal neuralgia    right side of face is numb   Unspecified vitamin D deficiency      Allergies  Allergen Reactions   Pregabalin Swelling   Diphenhydramine Nausea And Vomiting    Severe headaches  Severe headaches  Severe headaches    Nitrous Oxide  Other (See Comments)    Pt has gas bubble in right eye- will cause blindness per pt  Pt has gas bubble in right eye- will cause blindness per pt  Pt has gas bubble in right eye- will cause blindness per pt    Sulfa Antibiotics Other (See Comments)    Headache    Current Outpatient Medications on File Prior to Visit  Medication Sig   Acetaminophen (TYLENOL PO) Take by mouth.   Ascorbic Acid (VITAMIN C PO) Take 1 tablet by mouth daily.   azelastine (ASTELIN) 0.1 % nasal spray Apply 1 spray each nostril twice a day AS NEEDED for runny nose/drainage down throat.   Cholecalciferol (VITAMIN D3) 25 MCG (1000 UT) CAPS Take 1 capsule by mouth daily.   EPINEPHrine 0.3 mg/0.3 mL IJ SOAJ injection Inject 0.3 mg into the muscle as directed.   esomeprazole (NEXIUM) 40 MG capsule Take 1 capsule (40 mg total) by mouth 2 (two) times daily before a meal.   Multiple Vitamins-Minerals (MULTIVITAMIN WITH MINERALS) tablet Take 1 tablet by mouth daily.   OVER THE COUNTER MEDICATION Take 1 tablet by mouth daily. Allergy meds   Polyethylene Glycol 3350 (MIRALAX PO) Take 17 g by mouth as needed.   promethazine-dextromethorphan (PROMETHAZINE-DM) 6.25-15 MG/5ML syrup Take 5 mLs by mouth 4 (four) times daily as needed for cough.   vitamin B-12 (CYANOCOBALAMIN) 500 MCG tablet Take 500 mcg by mouth daily.   benzonatate (TESSALON PERLES) 100 MG capsule Take 1 capsule (100 mg) TID PRN for cough. (Patient not taking: Reported on 05/19/2022)   Omega-3 Fatty Acids (FISH OIL) 1200 MG CAPS Take 2 capsules by mouth daily. (Patient not taking: Reported on 05/19/2022)   predniSONE (DELTASONE) 20 MG tablet Take 2 tabs (40 mg) for 3 days followed by 1 tab (20 mg) for 4 days.   Rimegepant Sulfate (NURTEC) 75 MG TBDP Take 1 tab for migraine; can repeat in 2 hours if needed. (Patient not taking: Reported on 05/19/2022)   No current facility-administered medications on file prior to visit.    ROS: all negative except what is noted in the  HPI.   Physical Exam:  BP 116/72   Pulse 77   Temp (!) 97.5 F (36.4 C)   Ht 5' 4.75" (1.645 m)   Wt 175 lb 6.4 oz (79.6 kg)   SpO2 92%   BMI 29.41 kg/m   General Appearance: NAD.  Awake, conversant and cooperative. Eyes: PERRLA, EOMs intact.  Sclera white.  Conjunctiva without erythema. Sinuses: Frontal/maxillary tenderness.  No nasal discharge. Nares patent.  ENT/Mouth: Ext aud canals clear.  Bilateral TMs w/DOL and without erythema or bulging. Hearing intact.  Posterior pharynx without swelling or exudate.  Tonsils without swelling or erythema.  Neck: Supple.  No masses, nodules or thyromegaly. Respiratory: Effort is regular with non-labored breathing. Breath sounds are equal bilaterally without rales, rhonchi, wheezing or stridor.  Cardio: RRR with no MRGs. Brisk peripheral pulses without edema.  Abdomen: Active BS in all four quadrants.  Soft and non-tender without guarding, rebound tenderness, hernias or masses.  Lymphatics: Non tender without lymphadenopathy.  Musculoskeletal: Full ROM, 5/5 strength, normal ambulation.  No clubbing or cyanosis. Skin: Appropriate color for ethnicity. Warm without rashes, lesions, ecchymosis, ulcers.  Neuro: CN II-XII grossly normal. Normal muscle tone without cerebellar symptoms and intact sensation.   Psych: AO X 3,  appropriate mood and affect, insight and judgment.     Darrol Jump, NP 11:58 AM [facility]

## 2022-05-19 NOTE — Patient Instructions (Signed)
Bupropion Tablets (Depression/Mood Disorders) What is this medication? BUPROPION (byoo PROE pee on) treats depression. It increases norepinephrine and dopamine in the brain, hormones that help regulate mood. It belongs to a group of medications called NDRIs. This medicine may be used for other purposes; ask your health care provider or pharmacist if you have questions. COMMON BRAND NAME(S): Wellbutrin What should I tell my care team before I take this medication? They need to know if you have any of these conditions: An eating disorder, such as anorexia or bulimia Bipolar disorder or psychosis Diabetes or high blood sugar, treated with medication Glaucoma Heart disease, previous heart attack, or irregular heart beat Head injury or brain tumor High blood pressure Kidney or liver disease Seizures Suicidal thoughts or a previous suicide attempt Tourette's syndrome Weight loss An unusual or allergic reaction to bupropion, other medications, foods, dyes, or preservatives Pregnant or trying to become pregnant Breast-feeding How should I use this medication? Take this medication by mouth with a glass of water. Follow the directions on the prescription label. You can take it with or without food. If it upsets your stomach, take it with food. Take your medication at regular intervals. Do not take your medication more often than directed. Do not stop taking this medication suddenly except upon the advice of your care team. Stopping this medication too quickly may cause serious side effects or your condition may worsen. A special MedGuide will be given to you by the pharmacist with each prescription and refill. Be sure to read this information carefully each time. Talk to your care team regarding the use of this medication in children. Special care may be needed. Overdosage: If you think you have taken too much of this medicine contact a poison control center or emergency room at once. NOTE: This  medicine is only for you. Do not share this medicine with others. What if I miss a dose? If you miss a dose, take it as soon as you can. If it is less than four hours to your next dose, take only that dose and skip the missed dose. Do not take double or extra doses. What may interact with this medication? Do not take this medication with any of the following: Linezolid MAOIs like Azilect, Carbex, Eldepryl, Marplan, Nardil, and Parnate Methylene blue (injected into a vein) Other medications that contain bupropion like Zyban This medication may also interact with the following: Alcohol Certain medications for anxiety or sleep Certain medications for blood pressure like metoprolol, propranolol Certain medications for depression or psychotic disturbances Certain medications for HIV or AIDS like efavirenz, lopinavir, nelfinavir, ritonavir Certain medications for irregular heart beat like propafenone, flecainide Certain medications for Parkinson's disease like amantadine, levodopa Certain medications for seizures like carbamazepine, phenytoin, phenobarbital Cimetidine Clopidogrel Cyclophosphamide Digoxin Furazolidone Isoniazid Nicotine Orphenadrine Procarbazine Steroid medications like prednisone or cortisone Stimulant medications for attention disorders, weight loss, or to stay awake Tamoxifen Theophylline Thiotepa Ticlopidine Tramadol Warfarin This list may not describe all possible interactions. Give your health care provider a list of all the medicines, herbs, non-prescription drugs, or dietary supplements you use. Also tell them if you smoke, drink alcohol, or use illegal drugs. Some items may interact with your medicine. What should I watch for while using this medication? Tell your care team if your symptoms do not get better or if they get worse. Visit your care team for regular checks on your progress. Because it may take several weeks to see the full effects of this  medication,  it is important to continue your treatment as prescribed. Watch for new or worsening thoughts of suicide or depression. This includes sudden changes in mood, behavior, or thoughts. These changes can happen at any time but are more common in the beginning of treatment or after a change in dose. Call your care team right away if you experience these thoughts or worsening depression. Manic episodes may happen in patients with bipolar disorder who take this medication. Watch for changes in feelings or behaviors such as feeling anxious, nervous, agitated, panicky, irritable, hostile, aggressive, impulsive, severely restless, overly excited and hyperactive, or trouble sleeping. These symptoms can happen at anytime but are more common in the beginning of treatment or after a change in dose. Call your care team right away if you notice any of these symptoms. This medication may cause serious skin reactions. They can happen weeks to months after starting the medication. Contact your care team right away if you notice fevers or flu-like symptoms with a rash. The rash may be red or purple and then turn into blisters or peeling of the skin. Or, you might notice a red rash with swelling of the face, lips or lymph nodes in your neck or under your arms. Avoid drinks that contain alcohol while taking this medication. Drinking large amounts of alcohol, using sleeping or anxiety medications, or quickly stopping the use of these agents while taking this medication may increase your risk for a seizure. Do not drive or use heavy machinery until you know how this medication affects you. This medication can impair your ability to perform these tasks. Do not take this medication close to bedtime. It may prevent you from sleeping. Your mouth may get dry. Chewing sugarless gum or sucking hard candy, and drinking plenty of water may help. Contact your care team if the problem does not go away or is severe. What side  effects may I notice from receiving this medication? Side effects that you should report to your care team as soon as possible: Allergic reactions--skin rash, itching, hives, swelling of the face, lips, tongue, or throat Increase in blood pressure Mood and behavior changes--anxiety, nervousness, confusion, hallucinations, irritability, hostility, thoughts of suicide or self-harm, worsening mood, feelings of depression Redness, blistering, peeling, or loosening of the skin, including inside the mouth Seizures Sudden eye pain or change in vision such as blurry vision, seeing halos around lights, vision loss Side effects that usually do not require medical attention (report to your care team if they continue or are bothersome): Constipation Dizziness Dry mouth Loss of appetite Nausea Tremors or shaking Trouble sleeping This list may not describe all possible side effects. Call your doctor for medical advice about side effects. You may report side effects to FDA at 1-800-FDA-1088. Where should I keep my medication? Keep out of the reach of children and pets. Store at room temperature between 20 and 25 degrees C (68 and 77 degrees F), away from direct sunlight and moisture. Keep tightly closed. Throw away any unused medication after the expiration date. NOTE: This sheet is a summary. It may not cover all possible information. If you have questions about this medicine, talk to your doctor, pharmacist, or health care provider.  2023 Elsevier/Gold Standard (2020-05-06 00:00:00)

## 2022-05-21 ENCOUNTER — Ambulatory Visit: Payer: PPO | Admitting: Nurse Practitioner

## 2022-05-22 ENCOUNTER — Ambulatory Visit (INDEPENDENT_AMBULATORY_CARE_PROVIDER_SITE_OTHER): Payer: PPO

## 2022-05-22 DIAGNOSIS — J309 Allergic rhinitis, unspecified: Secondary | ICD-10-CM | POA: Diagnosis not present

## 2022-05-27 ENCOUNTER — Ambulatory Visit (INDEPENDENT_AMBULATORY_CARE_PROVIDER_SITE_OTHER): Payer: PPO

## 2022-05-27 DIAGNOSIS — J309 Allergic rhinitis, unspecified: Secondary | ICD-10-CM | POA: Diagnosis not present

## 2022-05-29 IMAGING — MG DIGITAL DIAGNOSTIC UNILAT LEFT W/ CAD
6 series · 6 of 6 positions shown · non-contrast
Comparison: Screening mammogram dated 12/02/2020

CLINICAL DATA: Patient returns today to evaluate LEFT breast
calcifications identified on recent screening mammogram.

EXAM:
DIGITAL DIAGNOSTIC UNILATERAL LEFT MAMMOGRAM WITH CAD
TECHNIQUE: Left digital diagnostic mammography was performed. Mammographic
images were processed with CAD.

[L CC (1 of 3)]
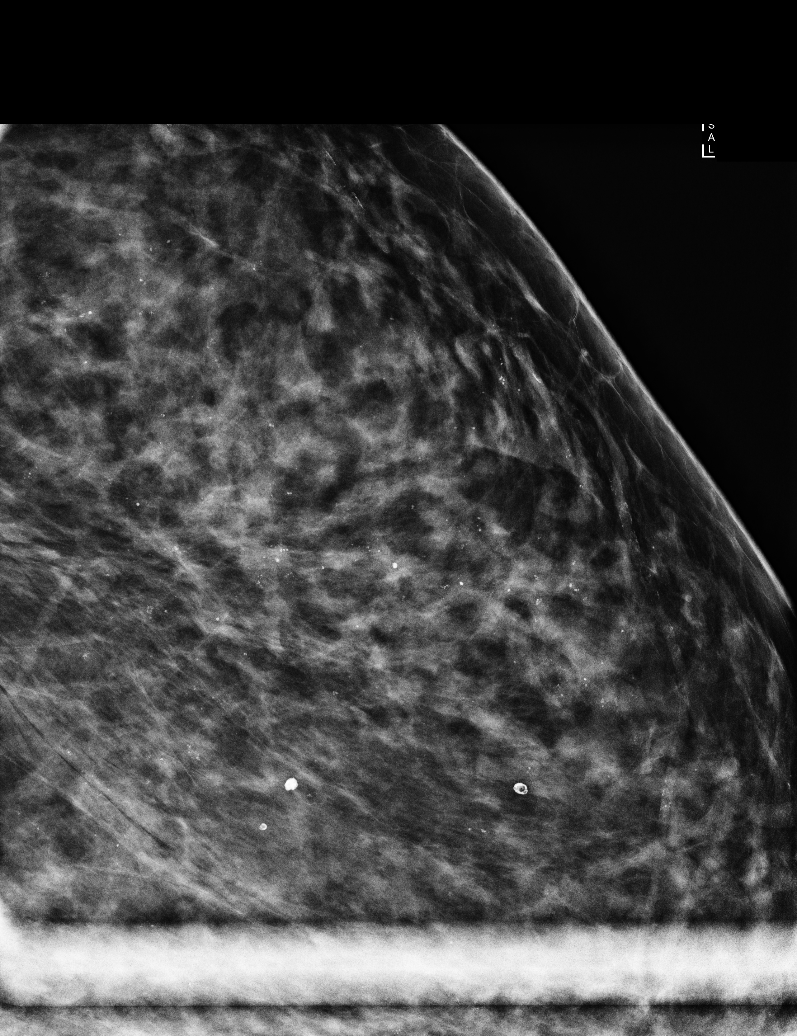

[L CC (2 of 3)]
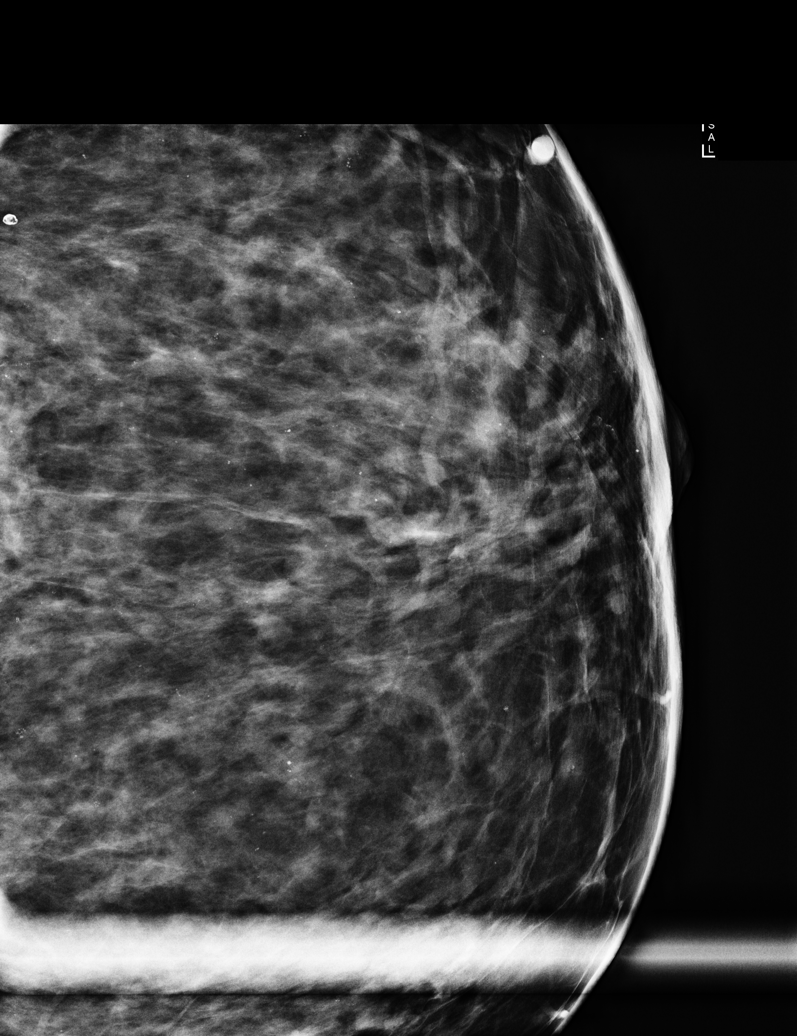

[L ML (1 of 3)]
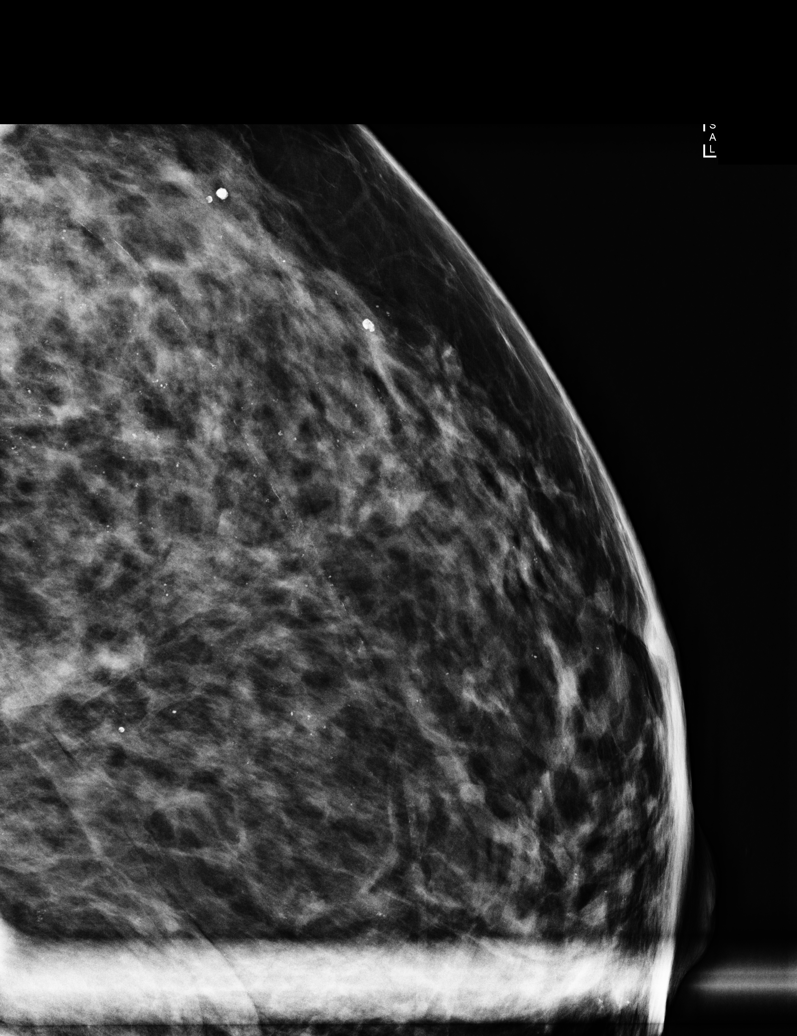

[L ML (2 of 3)]
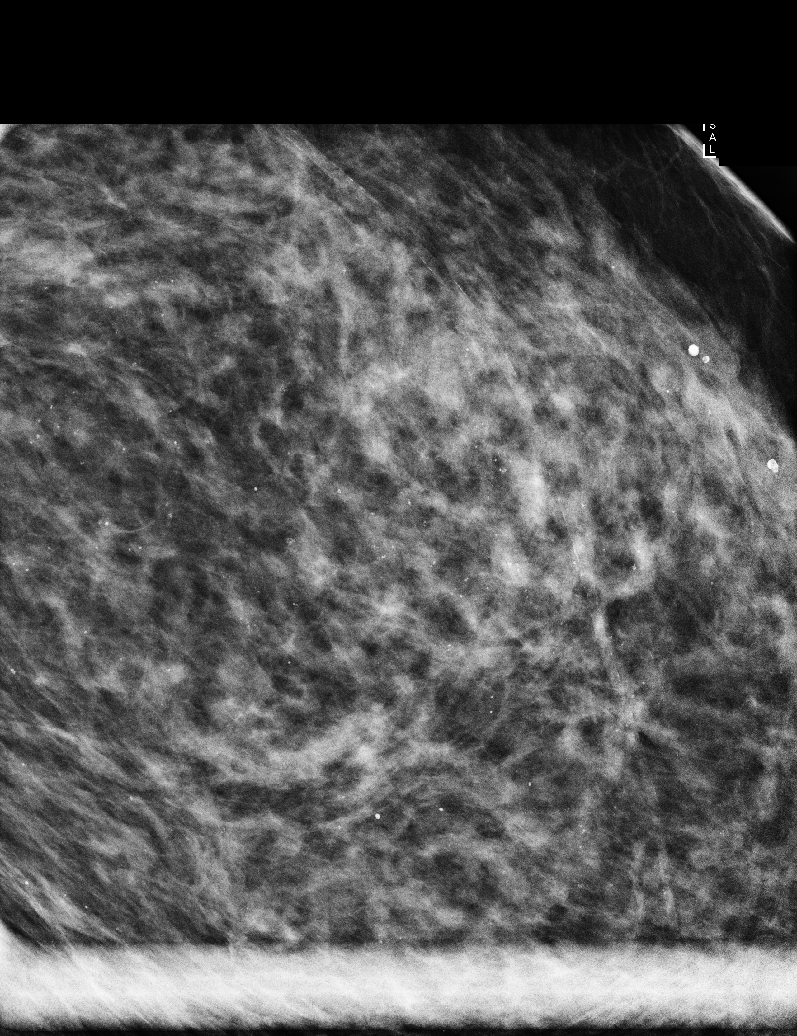

[L CC (3 of 3)]
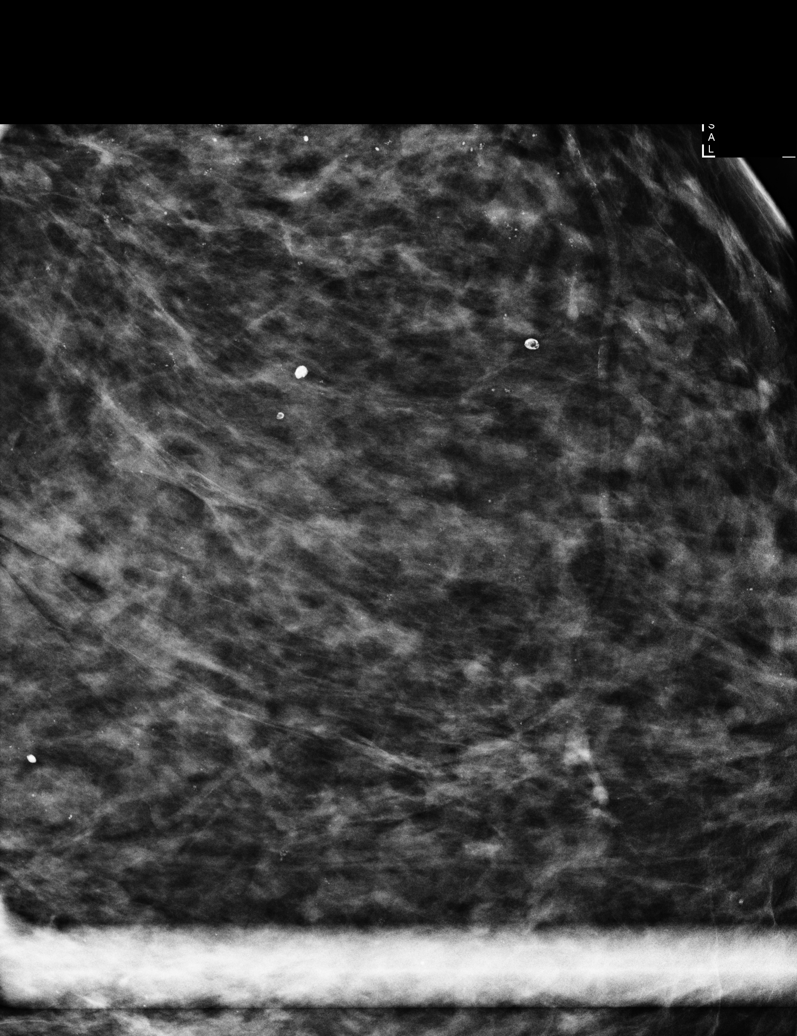

[L ML (3 of 3)]
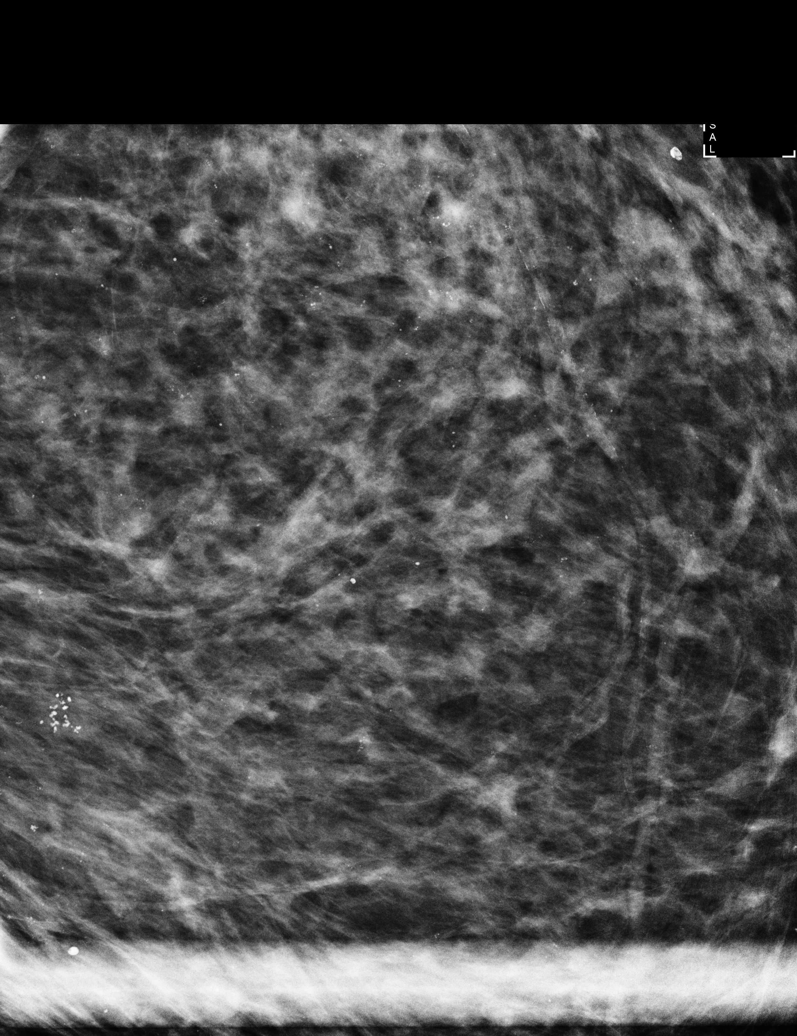

[6 of 6 positions shown; findings below may reference images not displayed]

ACR Breast Density Category c: The breast tissue is heterogeneously
dense, which may obscure small masses.
FINDINGS: On today's additional magnification views, the calcifications within
the LEFT breast are scattered diffusely within the LEFT breast and
demonstrate benign punctate, round and coarse morphologies. No
suspicious pleomorphic or fine linear branching calcifications are
identified.
IMPRESSION: No evidence of malignancy. Benign-appearing calcifications within
the LEFT breast.

Patient may return to routine annual bilateral screening mammogram
schedule.

RECOMMENDATION:
Screening mammogram in one year.(Code:[id number])

I have discussed the findings and recommendations with the patient.
If applicable, a reminder letter will be sent to the patient
regarding the next appointment.

BI-RADS CATEGORY  2: Benign.

## 2022-06-02 DIAGNOSIS — M952 Other acquired deformity of head: Secondary | ICD-10-CM | POA: Diagnosis not present

## 2022-06-02 DIAGNOSIS — R29818 Other symptoms and signs involving the nervous system: Secondary | ICD-10-CM | POA: Diagnosis not present

## 2022-06-02 DIAGNOSIS — S0993XS Unspecified injury of face, sequela: Secondary | ICD-10-CM | POA: Diagnosis not present

## 2022-06-02 DIAGNOSIS — H02403 Unspecified ptosis of bilateral eyelids: Secondary | ICD-10-CM | POA: Diagnosis not present

## 2022-06-02 DIAGNOSIS — G508 Other disorders of trigeminal nerve: Secondary | ICD-10-CM | POA: Diagnosis not present

## 2022-06-09 ENCOUNTER — Other Ambulatory Visit: Payer: Self-pay | Admitting: Nurse Practitioner

## 2022-06-09 ENCOUNTER — Other Ambulatory Visit: Payer: Self-pay | Admitting: Allergy & Immunology

## 2022-06-09 DIAGNOSIS — F419 Anxiety disorder, unspecified: Secondary | ICD-10-CM

## 2022-06-09 DIAGNOSIS — F3341 Major depressive disorder, recurrent, in partial remission: Secondary | ICD-10-CM

## 2022-06-10 ENCOUNTER — Ambulatory Visit (INDEPENDENT_AMBULATORY_CARE_PROVIDER_SITE_OTHER): Payer: PPO

## 2022-06-10 DIAGNOSIS — J309 Allergic rhinitis, unspecified: Secondary | ICD-10-CM | POA: Diagnosis not present

## 2022-06-13 ENCOUNTER — Other Ambulatory Visit: Payer: Self-pay | Admitting: Allergy & Immunology

## 2022-06-13 ENCOUNTER — Other Ambulatory Visit: Payer: Self-pay | Admitting: Nurse Practitioner

## 2022-06-13 DIAGNOSIS — F3341 Major depressive disorder, recurrent, in partial remission: Secondary | ICD-10-CM

## 2022-06-13 DIAGNOSIS — F419 Anxiety disorder, unspecified: Secondary | ICD-10-CM

## 2022-06-13 MED ORDER — BUPROPION HCL ER (XL) 150 MG PO TB24: 150.0000 mg | ORAL_TABLET | Freq: Every morning | ORAL | 0 refills | Status: DC

## 2022-06-15 MED ORDER — EPINEPHRINE 0.3 MG/0.3ML IJ SOAJ: 0.3000 mg | INTRAMUSCULAR | 1 refills | Status: DC | PRN

## 2022-06-25 DIAGNOSIS — Z888 Allergy status to other drugs, medicaments and biological substances status: Secondary | ICD-10-CM | POA: Diagnosis not present

## 2022-06-25 DIAGNOSIS — G508 Other disorders of trigeminal nerve: Secondary | ICD-10-CM | POA: Diagnosis not present

## 2022-06-25 DIAGNOSIS — M950 Acquired deformity of nose: Secondary | ICD-10-CM | POA: Diagnosis not present

## 2022-06-25 DIAGNOSIS — M952 Other acquired deformity of head: Secondary | ICD-10-CM | POA: Diagnosis not present

## 2022-06-25 DIAGNOSIS — Z882 Allergy status to sulfonamides status: Secondary | ICD-10-CM | POA: Diagnosis not present

## 2022-06-25 DIAGNOSIS — Z881 Allergy status to other antibiotic agents status: Secondary | ICD-10-CM | POA: Diagnosis not present

## 2022-06-25 DIAGNOSIS — K219 Gastro-esophageal reflux disease without esophagitis: Secondary | ICD-10-CM | POA: Diagnosis not present

## 2022-06-25 DIAGNOSIS — R519 Headache, unspecified: Secondary | ICD-10-CM | POA: Diagnosis not present

## 2022-06-25 DIAGNOSIS — Z8669 Personal history of other diseases of the nervous system and sense organs: Secondary | ICD-10-CM | POA: Diagnosis not present

## 2022-07-09 ENCOUNTER — Telehealth: Payer: Self-pay | Admitting: Nurse Practitioner

## 2022-07-09 DIAGNOSIS — F3341 Major depressive disorder, recurrent, in partial remission: Secondary | ICD-10-CM

## 2022-07-09 DIAGNOSIS — F419 Anxiety disorder, unspecified: Secondary | ICD-10-CM

## 2022-07-09 MED ORDER — BUPROPION HCL ER (XL) 150 MG PO TB24: 150.0000 mg | ORAL_TABLET | Freq: Every morning | ORAL | 0 refills | Status: DC

## 2022-07-09 NOTE — Telephone Encounter (Signed)
Patient is requesting a refill on Welbutrin to [facility] in [place]

## 2022-07-09 NOTE — Addendum Note (Signed)
Addended by: Chancy Hurter on: 07/09/2022 09:48 AM   Modules accepted: Orders

## 2022-07-16 DIAGNOSIS — K219 Gastro-esophageal reflux disease without esophagitis: Secondary | ICD-10-CM | POA: Diagnosis not present

## 2022-07-16 DIAGNOSIS — Z888 Allergy status to other drugs, medicaments and biological substances status: Secondary | ICD-10-CM | POA: Diagnosis not present

## 2022-07-16 DIAGNOSIS — M950 Acquired deformity of nose: Secondary | ICD-10-CM | POA: Diagnosis not present

## 2022-07-16 DIAGNOSIS — S0992XS Unspecified injury of nose, sequela: Secondary | ICD-10-CM | POA: Diagnosis not present

## 2022-07-16 DIAGNOSIS — M952 Other acquired deformity of head: Secondary | ICD-10-CM | POA: Diagnosis not present

## 2022-07-16 DIAGNOSIS — Z882 Allergy status to sulfonamides status: Secondary | ICD-10-CM | POA: Diagnosis not present

## 2022-07-21 ENCOUNTER — Ambulatory Visit: Payer: PPO | Admitting: Nurse Practitioner

## 2022-07-29 ENCOUNTER — Ambulatory Visit (INDEPENDENT_AMBULATORY_CARE_PROVIDER_SITE_OTHER): Payer: PPO

## 2022-07-29 DIAGNOSIS — J309 Allergic rhinitis, unspecified: Secondary | ICD-10-CM | POA: Diagnosis not present

## 2022-08-07 ENCOUNTER — Ambulatory Visit (INDEPENDENT_AMBULATORY_CARE_PROVIDER_SITE_OTHER): Payer: PPO

## 2022-08-07 DIAGNOSIS — J309 Allergic rhinitis, unspecified: Secondary | ICD-10-CM | POA: Diagnosis not present

## 2022-08-10 ENCOUNTER — Ambulatory Visit: Payer: PPO | Admitting: Nurse Practitioner

## 2022-08-12 ENCOUNTER — Ambulatory Visit (INDEPENDENT_AMBULATORY_CARE_PROVIDER_SITE_OTHER): Payer: PPO

## 2022-08-12 DIAGNOSIS — J309 Allergic rhinitis, unspecified: Secondary | ICD-10-CM

## 2022-08-19 ENCOUNTER — Encounter: Payer: Self-pay | Admitting: Nurse Practitioner

## 2022-08-19 ENCOUNTER — Ambulatory Visit (INDEPENDENT_AMBULATORY_CARE_PROVIDER_SITE_OTHER): Payer: PPO

## 2022-08-19 ENCOUNTER — Ambulatory Visit (INDEPENDENT_AMBULATORY_CARE_PROVIDER_SITE_OTHER): Payer: PPO | Admitting: Nurse Practitioner

## 2022-08-19 VITALS — BP 112/64 | HR 77 | Temp 97.9°F | Ht 64.75 in | Wt 171.6 lb

## 2022-08-19 DIAGNOSIS — E538 Deficiency of other specified B group vitamins: Secondary | ICD-10-CM

## 2022-08-19 DIAGNOSIS — E559 Vitamin D deficiency, unspecified: Secondary | ICD-10-CM

## 2022-08-19 DIAGNOSIS — I4510 Unspecified right bundle-branch block: Secondary | ICD-10-CM | POA: Diagnosis not present

## 2022-08-19 DIAGNOSIS — F3341 Major depressive disorder, recurrent, in partial remission: Secondary | ICD-10-CM | POA: Diagnosis not present

## 2022-08-19 DIAGNOSIS — Z0001 Encounter for general adult medical examination with abnormal findings: Secondary | ICD-10-CM | POA: Diagnosis not present

## 2022-08-19 DIAGNOSIS — R7309 Other abnormal glucose: Secondary | ICD-10-CM

## 2022-08-19 DIAGNOSIS — E669 Obesity, unspecified: Secondary | ICD-10-CM

## 2022-08-19 DIAGNOSIS — J309 Allergic rhinitis, unspecified: Secondary | ICD-10-CM

## 2022-08-19 DIAGNOSIS — E66811 Obesity, class 1: Secondary | ICD-10-CM

## 2022-08-19 DIAGNOSIS — H35342 Macular cyst, hole, or pseudohole, left eye: Secondary | ICD-10-CM

## 2022-08-19 DIAGNOSIS — H35341 Macular cyst, hole, or pseudohole, right eye: Secondary | ICD-10-CM

## 2022-08-19 DIAGNOSIS — Z79899 Other long term (current) drug therapy: Secondary | ICD-10-CM | POA: Diagnosis not present

## 2022-08-19 DIAGNOSIS — Z13820 Encounter for screening for osteoporosis: Secondary | ICD-10-CM | POA: Diagnosis not present

## 2022-08-19 DIAGNOSIS — M950 Acquired deformity of nose: Secondary | ICD-10-CM

## 2022-08-19 DIAGNOSIS — E785 Hyperlipidemia, unspecified: Secondary | ICD-10-CM | POA: Diagnosis not present

## 2022-08-19 DIAGNOSIS — Z0000 Encounter for general adult medical examination without abnormal findings: Secondary | ICD-10-CM

## 2022-08-19 DIAGNOSIS — R6889 Other general symptoms and signs: Secondary | ICD-10-CM

## 2022-08-19 NOTE — Patient Instructions (Signed)

## 2022-08-19 NOTE — Progress Notes (Signed)
AWV AND FOLLOW UP  Assessment and Plan:  Annual Medicare Wellness Visit Due annually  Health maintenance reviewed  Hyperlipidemia Discussed lifestyle modifications. Recommended diet heavy in fruits and veggies, omega 3's. Decrease consumption of animal meats, cheeses, and dairy products. Remain active and exercise as tolerated. Continue to monitor. Check lipids/TSH  Vitamin D deficiency Continue supplement for goal of 60-100 Monitor Vitamin D levels  Hx of prediabetes Education: Reviewed 'ABCs' of diabetes management  Discussed goals to be met and/or maintained include A1C (<7) Blood pressure (<130/80) Cholesterol (LDL <70) Continue Eye Exam yearly  Continue Dental Exam Q6 mo Discussed dietary recommendations Discussed Physical Activity recommendations Check A1C  Obesity (BMI 30.0-34.9) Discussed appropriate BMI Diet modification. Physical activity. Encouraged/praised to build confidence.  Macular hole, right eye/left eye Followed by ophthalmology - declining further surgeries at this time  Medication management All medications discussed and reviewed in full. All questions and concerns regarding medications addressed.    RBBB Monitor, avoid rate controlling agents  Acquired deformity of nose Continue with [employer] Continue to monitor  Major depressive disorder, in partial remission ([place]) Controlled - continue family support Reviewed relaxation techniques.  Sleep hygiene. Recommended mindfulness meditation and exercise.   Encouraged personality growth wand development through coping techniques and problem-solving skills. Limit/Decrease/Monitor drug/alcohol intake.    B12 Recheck levels at CPE, continue supplement as indicated  Screening for osteoporosis DEXA ordered  Orders Placed This Encounter  Procedures   DG Bone Density    Standing Status:   Future    Standing Expiration Date:   08/19/2023    Order Specific Question:   Reason for Exam  (SYMPTOM  OR DIAGNOSIS REQUIRED)    Answer:   Screening for osteoporosis    Order Specific Question:   Preferred imaging location?    [facility]   CBC with Differential/Platelet   COMPLETE METABOLIC PANEL WITH GFR   Lipid panel   Hemoglobin A1c   VITAMIN D 25 Hydroxy (Vit-D Deficiency, Fractures)   Notify office for further evaluation and treatment, questions or concerns if any reported s/s fail to improve.   The patient was advised to call back or seek an in-person evaluation if any symptoms worsen or if the condition fails to improve as anticipated.   Further disposition pending results of labs. Discussed med's effects and SE's.    I discussed the assessment and treatment plan with the patient. The patient was provided an opportunity to ask questions and all were answered. The patient agreed with the plan and demonstrated an understanding of the instructions.  Discussed med's effects and SE's. Screening labs and tests as requested with regular follow-up as recommended.  I provided 40 minutes of face-to-face time during this encounter including counseling, chart review, and critical decision making was preformed.  Today's Plan of Care is based on a patient-centered health care approach known as shared decision making - the decisions, tests and treatments allow for patient preferences and values to be balanced with clinical evidence.    Future Appointments  Date Time Provider [place]  02/01/2023 10:00 AM Darrol Jump, NP [facility] None  03/18/2023 12:45 PM [name], MD [facility] None  08/02/2023  9:00 AM Darrol Jump, NP [facility] None     Plan:   During the course of the visit the patient was educated and counseled about appropriate screening and preventive services including:   Pneumococcal vaccine  Prevnar 13 Influenza vaccine Td vaccine Screening electrocardiogram Bone densitometry screening Colorectal cancer screening  Diabetes  screening Glaucoma screening Nutrition counseling  Advanced directives: requested    HPI  67 y.o. female  presents for AWV and follow up. She  has Depression, major, recurrent; Vitamin D deficiency; Anxiety; Other abnormal glucose (hx of prediabetes); Macular hole, left eye; Macular hole, right eye; Obesity (BMI 30.0-34.9); Right bundle branch block (RBBB); Chronic rhinitis; Nasal deformity, acquired; Hyperlipidemia; Non-restorative sleep; Uncontrolled morning headache; Acquired facial deformity; GERD with stricture; Depression; and Excessive daytime sleepiness on their problem list.   Overall she reports feeling well today.  She has no new complaints.   She is single. No longer working d/t multiple eye procedures (macular holes, surgery x 3, declining further), no longer driving, daughter or mom drives her if needed. Avoids going out at night or high risk fall situations. Denies falls or near falls this year.   She has acquired nasal deformity following steroid injections remotely for possible discoid lupus, now has chronic deformity, nasal discharge and nasal crusting, saw ENT in [place] [facility] [name], was referred to cosmetic surgeon and allergist. Recently completed reconstructive surgery.  [name] at [facility]. [facility], Dr. Rodman Pickle II originally estb on 6/02022 with f/u on 06/08/22 for nonhealing ulcerative lesion on the right alar remnant, right nasal sidewall, right cheek and right upper lip.  Dated back to 1999 when she had surgery by Dr Blenda Peals for trigeminal neuralgia and from her description sounds like a Jannetta procedure.  She has seen multiple MD's over the years.  She did admit to picking at the wound.  Is not able to control.  She reports she has not seen a psychiatrist or therapist about the issue.  She returned on 05/2022 for a post op visitit after a flap delay prcedure on 06/02/2022 and is doing very well.  She is scheduled for another surgery on  06/25/22.  Has some anxiety and depression but feels it is well controlled with good family support. Did not do well with lexapro or wellbutrin, feels better off of med, doing therapy.   Has history of GERD and has had EGD/dilitation in 2015 and again in 2019. She is taking nexium daily.   BMI is Body mass index is 28.78 kg/m., she has been working on diet and exercise. Wt Readings from Last 3 Encounters:  08/19/22 171 lb 9.6 oz (77.8 kg)  05/19/22 175 lb 6.4 oz (79.6 kg)  01/29/22 183 lb 3.2 oz (83.1 kg)   Today their BP is BP: 112/64 She does workout. She denies chest pain, shortness of breath, dizziness.   She is not on cholesterol medication and denies myalgias. Her cholesterol is not at goal, higher than usual last visit. The cholesterol last visit was:   Lab Results  Component Value Date   CHOL 187 01/29/2022   HDL 49 (L) 01/29/2022   LDLCALC 111 (H) 01/29/2022   TRIG 154 (H) 01/29/2022   CHOLHDL 3.8 01/29/2022   She has been working on diet and exercise for hx of prediabetes, she is not on bASA, she is not on ACE/ARB and denies foot ulcerations, increased appetite, nausea, paresthesia of the feet, polydipsia, polyuria, vomiting and weight loss. Last A1C in the office was:  Lab Results  Component Value Date   HGBA1C 5.7 (H) 01/29/2022   Last GFR: Lab Results  Component Value Date   GFRNONAA 80 07/10/2020   Patient is on Vitamin D supplement, taking 2000 IU daily   Lab Results  Component Value Date  VD25OH 71 01/29/2022     She takes chewable supplement daily, feels helps her energy, has done shots in the past Lab Results  Component Value Date   VITAMINB12 1,880 (H) 01/29/2022     Current Medications:  Current Outpatient Medications on File Prior to Visit  Medication Sig Dispense Refill   Acetaminophen (TYLENOL PO) Take by mouth.     Ascorbic Acid (VITAMIN C PO) Take 1 tablet by mouth daily.     azelastine (ASTELIN) 0.1 % nasal spray Apply 1 spray each nostril  twice a day AS NEEDED for runny nose/drainage down throat. 30 mL 5   benzonatate (TESSALON PERLES) 100 MG capsule Take 1 capsule (100 mg) TID PRN for cough. 30 capsule 0   buPROPion (WELLBUTRIN XL) 150 MG 24 hr tablet Take 1 tablet (150 mg total) by mouth every morning. 30 tablet 0   Cholecalciferol (VITAMIN D3) 25 MCG (1000 UT) CAPS Take 1 capsule by mouth daily.     EPINEPHrine 0.3 mg/0.3 mL IJ SOAJ injection Inject 0.3 mg into the muscle as needed for anaphylaxis. 1 each 1   esomeprazole (NEXIUM) 40 MG capsule Take 1 capsule (40 mg total) by mouth 2 (two) times daily before a meal. 60 capsule 11   Multiple Vitamins-Minerals (MULTIVITAMIN WITH MINERALS) tablet Take 1 tablet by mouth daily.     OVER THE COUNTER MEDICATION Take 1 tablet by mouth daily. Allergy meds     Polyethylene Glycol 3350 (MIRALAX PO) Take 17 g by mouth as needed.     vitamin B-12 (CYANOCOBALAMIN) 500 MCG tablet Take 500 mcg by mouth daily.     Omega-3 Fatty Acids (FISH OIL) 1200 MG CAPS Take 2 capsules by mouth daily. (Patient not taking: Reported on 05/19/2022)     predniSONE (DELTASONE) 20 MG tablet Take 2 tabs (40 mg) for 3 days followed by 1 tab (20 mg) for 4 days. 10 tablet 0   Rimegepant Sulfate (NURTEC) 75 MG TBDP Take 1 tab for migraine; can repeat in 2 hours if needed. (Patient not taking: Reported on 05/19/2022) 2 tablet 0   No current facility-administered medications on file prior to visit.   Allergies:  Allergies  Allergen Reactions   Pregabalin Swelling   Diphenhydramine Nausea And Vomiting    Severe headaches  Severe headaches  Severe headaches    Nitrous Oxide Other (See Comments)    Pt has gas bubble in right eye- will cause blindness per pt  Pt has gas bubble in right eye- will cause blindness per pt  Pt has gas bubble in right eye- will cause blindness per pt    Sulfa Antibiotics Other (See Comments)    Headache   Medical History:  She has Depression, major, recurrent; Vitamin D deficiency;  Anxiety; Other abnormal glucose (hx of prediabetes); Macular hole, left eye; Macular hole, right eye; Obesity (BMI 30.0-34.9); Right bundle branch block (RBBB); Chronic rhinitis; Nasal deformity, acquired; Hyperlipidemia; Non-restorative sleep; Uncontrolled morning headache; Acquired facial deformity; GERD with stricture; Depression; and Excessive daytime sleepiness on their problem list. Health Maintenance:   Immunization History  Administered Date(s) Administered   Influenza, High Dose Seasonal PF 06/26/2021   Influenza-Unspecified 02/15/2018, 02/16/2020   PPD Test 05/31/2013   Pneumococcal Polysaccharide-23 07/10/2020   Tdap 05/31/2013   Health Maintenance  Topic Date Due   COVID-19 Vaccine (1) Never done   Zoster Vaccines- Shingrix (1 of 2) Never done   Pneumonia Vaccine 72+ Years old (2 of 2 - PCV) 07/10/2021   INFLUENZA  VACCINE  12/17/2022   DTaP/Tdap/Td (2 - Td or Tdap) 06/01/2023   Medicare Annual Wellness (AWV)  08/19/2023   COLONOSCOPY (Pts 45-35yrs Insurance coverage will need to be confirmed)  08/26/2023   MAMMOGRAM  01/07/2024   DEXA SCAN  Completed   Hepatitis C Screening  Completed   HPV VACCINES  Aged Out   Shingrix: check with insurance  Covid 19: declines  Pap: Remote, never abnormal, s/p TAH, declines further unless problems MGM: 01/2022 gets annually at breast center DEXA:09/2010 Due - ordered   Colonoscopy: 2015, Dr. Fuller Plan, 10 year recall  EGD: 2019  Last Dental Exam: Dr. Luan Pulling in [place], last visit 2023, encouraged to schedule Last Eye Exam: Dr. Tempie Hoist, [facility], last visit 2024, goes q88m  Patient Care Team: Unk Pinto, MD as PCP - General (Internal Medicine)  Surgical History:  She has a past surgical history that includes Abdominal hysterectomy; Cesarean section; trigeminal ; Appendectomy; knot removed from right side of abdomen; Colonoscopy; 25 gauge pars plana vitrectomy with 20 gauge mvr port for macular hole  (Left, 03/05/2015); Membrane peel (Left, 03/05/2015); Serum patch (Left, 03/05/2015); Laser photo ablation (Left, 03/05/2015); Gas/fluid exchange (Left, 03/05/2015); 25 gauge pars plana vitrectomy with 20 gauge mvr port for macular hole (Right, 06/22/2017); 25 gauge pars plana vitrectomy with 20 gauge mvr port for macular hole (Right, 06/22/2017); Gas/fluid exchange (Right, 06/22/2017); Laser photo ablation (Right, 06/22/2017); Serum patch (Right, 06/22/2017); Membrane peel (Right, 06/22/2017); Scleral buckle with possible 25 gauge pars plana vitrectomy (Right, 07/27/2017); Laser photo ablation (Right, 07/27/2017); Gas/fluid exchange (Right, 07/27/2017); Membrane peel (Right, 07/27/2017); 25 gauge pars plana vitrectomy with 20 gauge mvr port (Left, 11/09/2017); Esophagogastroduodenoscopy (egd) with esophageal dilation; Eye surgery; 25 gauge pars plana vitrectomy with 20 gauge mvr port (Left, 11/09/2017); Membrane peel (Left, 11/09/2017); Gas/fluid exchange (Left, 11/09/2017); Serum patch (Left, 11/09/2017); and Laser photo ablation (Left, 11/09/2017). Family History:  Herfamily history includes Alzheimer's disease in her maternal aunt and maternal grandmother; Bone cancer in her maternal grandfather; Breast cancer in her maternal aunt; COPD in her mother; Cancer in her paternal uncle; Colon cancer in her paternal uncle; Diabetes in her mother; Heart disease in her father; Hodgkin's lymphoma in her paternal aunt; Hyperlipidemia in her father and mother; Hypertension in her brother and father; Pancreatic cancer (age of onset: 64) in her father; Prostate cancer in her maternal grandfather. Social History:  She reports that she has never smoked. She has been exposed to tobacco smoke. She has never used smokeless tobacco. She reports that she does not drink alcohol and does not use drugs.  MEDICARE WELLNESS OBJECTIVES: Physical activity:   Cardiac risk factors:   Depression/mood screen:      08/19/2022   10:38 AM  Depression  screen PHQ 2/9  Decreased Interest 1  Down, Depressed, Hopeless 0  PHQ - 2 Score 1    ADLs:     08/19/2022   10:27 AM  In your present state of health, do you have any difficulty performing the following activities:  Hearing? 1  Vision? 1  Comment Unable to see d/t recent nose surgery, unable to wear glasses at this time.  Sees [facility] specialists for macular degenration.  Difficulty concentrating or making decisions? 0  Walking or climbing stairs? 0  Comment Has walking stick for walking d/t vision  Dressing or bathing? 0  Doing errands, shopping? 1  Preparing Food and eating ? N  Using the Toilet? N  In the past six  months, have you accidently leaked urine? N  Do you have problems with loss of bowel control? N  Managing your Medications? N  Managing your Finances? N  Housekeeping or managing your Housekeeping? N     Cognitive Testing  Alert? Yes  Normal Appearance?Yes  Oriented to person? Yes  Place? Yes   Time? Yes  Recall of three objects?  Yes  Can perform simple calculations? Yes  Displays appropriate judgment?Yes  Can read the correct time from a watch face?Yes  EOL planning: Does Patient Have a Medical Advance Directive?: No     Review of Systems: Review of Systems  Constitutional:  Negative for malaise/fatigue and weight loss.  HENT:  Negative for hearing loss and tinnitus.        Chronic rhinitis  Eyes:  Positive for blurred vision (chronic). Negative for double vision.  Respiratory:  Negative for cough, sputum production, shortness of breath and wheezing.   Cardiovascular:  Negative for chest pain, palpitations, orthopnea, claudication, leg swelling and PND.  Gastrointestinal:  Negative for abdominal pain, blood in stool, constipation, diarrhea, heartburn, melena, nausea and vomiting.  Genitourinary: Negative.   Musculoskeletal:  Negative for falls, joint pain and myalgias.  Skin:  Negative for rash.  Neurological:  Negative for dizziness, tingling,  sensory change, weakness and headaches.  Endo/Heme/Allergies:  Negative for environmental allergies and polydipsia.  Psychiatric/Behavioral:  Positive for depression. Negative for memory loss, substance abuse and suicidal ideas. The patient is not nervous/anxious and does not have insomnia.   All other systems reviewed and are negative.   Physical Exam: Estimated body mass index is 28.78 kg/m as calculated from the following:   Height as of this encounter: 5' 4.75" (1.645 m).   Weight as of this encounter: 171 lb 9.6 oz (77.8 kg). BP 112/64   Pulse 77   Temp 97.9 F (36.6 C)   Ht 5' 4.75" (1.645 m)   Wt 171 lb 9.6 oz (77.8 kg)   SpO2 99%   BMI 28.78 kg/m  General Appearance: Well nourished, well dressed elder female, in no apparent distress.  Eyes: R pupil minimally reactive, left round and reactive, EOMs, conjunctiva no swelling or erythema Sinuses: No Frontal/maxillary tenderness  ENT/Mouth: Ext aud canals clear, normal light reflex with TMs without erythema, bulging. Good dentition. No erythema, swelling, or exudate on post pharynx. Tonsils not swollen or erythematous. Hearing normal. R nare with well healing skin graft.  No no open wounds or ulcerations; clear crusty discharge. Surrounding skin WNL. Neck: Supple, thyroid normal. No bruits  Respiratory: Respiratory effort normal, BS equal bilaterally without rales, rhonchi, wheezing or stridor.  Cardio: RRR without murmurs, rubs or gallops. Brisk peripheral pulses without edema.  Abdomen: Soft, nontender, no guarding, rebound, hernias, masses, or organomegaly.  Lymphatics: Non tender without lymphadenopathy.  Musculoskeletal: Full ROM all peripheral extremities,5/5 strength, and normal gait.  Skin: Warm, dry without rashes, lesions, ecchymosis. 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 Normal muscle tone, no cerebellar symptoms. Sensation intact.  Psych: Awake and oriented X 3,  normal affect, Insight and Judgment appropriate.   Medicare Attestation I have personally reviewed: The patient's medical and social history Their use of alcohol, tobacco or illicit drugs Their current medications and supplements The patient's functional ability including ADLs,fall risks, home safety risks, cognitive, and hearing and visual impairment Diet and physical activities Evidence for depression or mood disorders  The patient's weight, height, BMI, and visual acuity have been recorded in the  chart.  I have made referrals, counseling, and provided education to the patient based on review of the above and I have provided the patient with a written personalized care plan for preventive services.      Darrol Jump, NP-C 10:39 AM [facility]

## 2022-08-20 LAB — VITAMIN D 25 HYDROXY (VIT D DEFICIENCY, FRACTURES): Vit D, 25-Hydroxy: 73 ng/mL (ref 30–100)

## 2022-08-20 LAB — COMPLETE METABOLIC PANEL WITH GFR
AG Ratio: 1.5 (calc) (ref 1.0–2.5)
ALT: 13 U/L (ref 6–29)
AST: 12 U/L (ref 10–35)
Albumin: 4.2 g/dL (ref 3.6–5.1)
Alkaline phosphatase (APISO): 78 U/L (ref 37–153)
BUN: 18 mg/dL (ref 7–25)
CO2: 25 mmol/L (ref 20–32)
Calcium: 9.6 mg/dL (ref 8.6–10.4)
Chloride: 105 mmol/L (ref 98–110)
Creat: 0.73 mg/dL (ref 0.50–1.05)
Globulin: 2.8 g/dL (calc) (ref 1.9–3.7)
Glucose, Bld: 89 mg/dL (ref 65–99)
Potassium: 4.5 mmol/L (ref 3.5–5.3)
Sodium: 140 mmol/L (ref 135–146)
Total Bilirubin: 0.3 mg/dL (ref 0.2–1.2)
Total Protein: 7 g/dL (ref 6.1–8.1)
eGFR: 90 mL/min/{1.73_m2} (ref >=60)

## 2022-08-20 LAB — HEMOGLOBIN A1C
Hgb A1c MFr Bld: 5.6 % of total Hgb (ref <5.7)
Mean Plasma Glucose: 114 mg/dL
eAG (mmol/L): 6.3 mmol/L

## 2022-08-20 LAB — CBC WITH DIFFERENTIAL/PLATELET
Absolute Monocytes: 456 cells/uL (ref 200–950)
Basophils Absolute: 47 cells/uL (ref 0–200)
Basophils Relative: 0.5 %
Eosinophils Absolute: 112 cells/uL (ref 15–500)
Eosinophils Relative: 1.2 %
HCT: 39.9 % (ref 35.0–45.0)
Hemoglobin: 13.4 g/dL (ref 11.7–15.5)
Lymphs Abs: 3096.9 cells/uL (ref 850–3900)
MCH: 30.3 pg (ref 27.0–33.0)
MCHC: 33.6 g/dL (ref 32.0–36.0)
MCV: 90.3 fL (ref 80.0–100.0)
MPV: 9.9 fL (ref 7.5–12.5)
Monocytes Relative: 4.9 %
Neutro Abs: 5589 cells/uL (ref 1500–7800)
Neutrophils Relative %: 60.1 %
Platelets: 349 10*3/uL (ref 140–400)
RBC: 4.42 10*6/uL (ref 3.80–5.10)
RDW: 12.3 % (ref 11.0–15.0)
Total Lymphocyte: 33.3 %
WBC: 9.3 10*3/uL (ref 3.8–10.8)

## 2022-08-20 LAB — LIPID PANEL
Cholesterol: 193 mg/dL (ref <200)
HDL: 52 mg/dL (ref >=50)
LDL Cholesterol (Calc): 111 mg/dL (calc) — ABNORMAL HIGH
Non-HDL Cholesterol (Calc): 141 mg/dL (calc) — ABNORMAL HIGH (ref <130)
Total CHOL/HDL Ratio: 3.7 (calc) (ref <5.0)
Triglycerides: 188 mg/dL — ABNORMAL HIGH (ref <150)

## 2022-08-28 ENCOUNTER — Ambulatory Visit (INDEPENDENT_AMBULATORY_CARE_PROVIDER_SITE_OTHER): Payer: PPO

## 2022-08-28 DIAGNOSIS — J309 Allergic rhinitis, unspecified: Secondary | ICD-10-CM | POA: Diagnosis not present

## 2022-09-03 ENCOUNTER — Telehealth: Payer: Self-pay | Admitting: Nurse Practitioner

## 2022-09-03 NOTE — Telephone Encounter (Signed)
Patient is requesting a refill on her Welbutrin but states that it isn't working as well as she feels like it should and would like to increase the dose if possible. Please send to [facility] in [place]

## 2022-09-04 ENCOUNTER — Encounter: Payer: Self-pay | Admitting: Nurse Practitioner

## 2022-09-04 ENCOUNTER — Ambulatory Visit (INDEPENDENT_AMBULATORY_CARE_PROVIDER_SITE_OTHER): Payer: PPO

## 2022-09-04 ENCOUNTER — Other Ambulatory Visit: Payer: Self-pay | Admitting: Nurse Practitioner

## 2022-09-04 DIAGNOSIS — F3341 Major depressive disorder, recurrent, in partial remission: Secondary | ICD-10-CM

## 2022-09-04 DIAGNOSIS — J309 Allergic rhinitis, unspecified: Secondary | ICD-10-CM | POA: Diagnosis not present

## 2022-09-04 DIAGNOSIS — F419 Anxiety disorder, unspecified: Secondary | ICD-10-CM

## 2022-09-04 MED ORDER — BUPROPION HCL ER (XL) 150 MG PO TB24: ORAL_TABLET | ORAL | 1 refills | Status: DC

## 2022-09-04 NOTE — Telephone Encounter (Signed)
Spoke with patient. She is aware of the change to medication.

## 2022-09-08 DIAGNOSIS — J3081 Allergic rhinitis due to animal (cat) (dog) hair and dander: Secondary | ICD-10-CM | POA: Diagnosis not present

## 2022-09-08 NOTE — Progress Notes (Signed)
VIALS EXP 09-08-23 

## 2022-09-09 DIAGNOSIS — J301 Allergic rhinitis due to pollen: Secondary | ICD-10-CM | POA: Diagnosis not present

## 2022-09-11 ENCOUNTER — Ambulatory Visit (INDEPENDENT_AMBULATORY_CARE_PROVIDER_SITE_OTHER): Payer: PPO

## 2022-09-11 DIAGNOSIS — J309 Allergic rhinitis, unspecified: Secondary | ICD-10-CM | POA: Diagnosis not present

## 2022-09-18 ENCOUNTER — Ambulatory Visit (INDEPENDENT_AMBULATORY_CARE_PROVIDER_SITE_OTHER): Payer: PPO

## 2022-09-18 DIAGNOSIS — J309 Allergic rhinitis, unspecified: Secondary | ICD-10-CM

## 2022-09-25 ENCOUNTER — Ambulatory Visit (INDEPENDENT_AMBULATORY_CARE_PROVIDER_SITE_OTHER): Payer: PPO

## 2022-09-25 DIAGNOSIS — J309 Allergic rhinitis, unspecified: Secondary | ICD-10-CM | POA: Diagnosis not present

## 2022-09-30 ENCOUNTER — Ambulatory Visit (INDEPENDENT_AMBULATORY_CARE_PROVIDER_SITE_OTHER): Payer: PPO

## 2022-09-30 DIAGNOSIS — J309 Allergic rhinitis, unspecified: Secondary | ICD-10-CM | POA: Diagnosis not present

## 2022-10-09 ENCOUNTER — Ambulatory Visit (INDEPENDENT_AMBULATORY_CARE_PROVIDER_SITE_OTHER): Payer: PPO

## 2022-10-09 DIAGNOSIS — J309 Allergic rhinitis, unspecified: Secondary | ICD-10-CM | POA: Diagnosis not present

## 2022-10-09 MED ORDER — LEVOCETIRIZINE DIHYDROCHLORIDE 5 MG PO TABS: 5.0000 mg | ORAL_TABLET | Freq: Every day | ORAL | 0 refills | Status: DC | PRN

## 2022-10-15 NOTE — Progress Notes (Unsigned)
[address] [place] [address] Dept: [phone]  FOLLOW UP NOTE  Patient ID: Margaret Gilmore, female    DOB: [birth year]  Age: 67 y.o. MRN: [id number] Date of Office Visit: 10/16/2022  Assessment  Chief Complaint: Follow-up (Started taking xyzal and is not sneezing as much )  HPI Margaret Gilmore is a 67 year old female who presents to the clinic for follow-up visit.  She was last seen in this clinic on 10/29/2021 by Nehemiah Settle, FNP, for evaluation of allergic rhinitis, shortness of breath, and raspy voice.  In the interim, she has had reconstructive nasal surgery with Dr. Chestine Spore.   At today's visit, she reports her allergic rhinitis has been moderately well controlled with symptoms including slight nasal congestion,    Drug Allergies:  Allergies  Allergen Reactions   Pregabalin Swelling   Diphenhydramine Nausea And Vomiting    Severe headaches  Severe headaches  Severe headaches    Nitrous Oxide Other (See Comments)    Pt has gas bubble in right eye- will cause blindness per pt  Pt has gas bubble in right eye- will cause blindness per pt  Pt has gas bubble in right eye- will cause blindness per pt    Sulfa Antibiotics Other (See Comments)    Headache    Physical Exam: BP 110/70   Pulse 74   Temp 97.9 F (36.6 C)   Resp 16   Ht 5' 6.5" (1.689 m)   Wt 177 lb 4 oz (80.4 kg)   SpO2 97%   BMI 28.18 kg/m    Physical Exam  Diagnostics:    Assessment and Plan: No diagnosis found.  No orders of the defined types were placed in this encounter.   Patient Instructions  Seasonal and perennial allergic rhinitis (skin test positive OZ:[id number], ragweed, trees, indoor molds, outdoor molds, cat, and dog) Continue levocetirizine 5 mg once a day as needed for a runny nose or itch Consider saline nasal rinses as needed for nasal symptoms. Use this before any medicated nasal sprays for best result Restart azelastine as needed for a runny nose  Allergic  conjunctivitis Some over the counter eye drops include Pataday one drop in each eye once a day as needed for red, itchy eyes OR Zaditor one drop in each eye twice a day as needed for red itchy eyes. Avoid eye drops that say red eye relief as they may contain medications that dry out your eyes.    Shortness of breath/cough May use albuterol using 2 puffs every 4-6 hours as needed for cough, wheeze, tightness in chest, or shortness of breath to see if this helps with your shortness of breath and cough.   Follow up in 6-12 months or sooner if needed   Reducing Pollen Exposure  The American Academy of Allergy, Asthma and Immunology suggests the following steps to reduce your exposure to pollen during allergy seasons.    Do not hang sheets or clothing out to dry; pollen may collect on these items. Do not mow lawns or spend time around freshly cut grass; mowing stirs up pollen. Keep windows closed at night.  Keep car windows closed while driving. Minimize morning activities outdoors, a time when pollen counts are usually at their highest. Stay indoors as much as possible when pollen counts or humidity is high and on windy days when pollen tends to remain in the air longer. Use air conditioning when possible.  Many air conditioners have filters that trap the pollen spores.  Use a HEPA room air filter to remove pollen form the indoor air you breathe.  Control of Mold Allergen   Mold and fungi can grow on a variety of surfaces provided certain temperature and moisture conditions exist.  Outdoor molds grow on plants, decaying vegetation and soil.  The major outdoor mold, Alternaria and Cladosporium, are found in very high numbers during hot and dry conditions.  Generally, a late Summer - Fall peak is seen for common outdoor fungal spores.  Rain will temporarily lower outdoor mold spore count, but counts rise rapidly when the rainy period ends.  The most important indoor molds are Aspergillus and  Penicillium.  Dark, humid and poorly ventilated basements are ideal sites for mold growth.  The next most common sites of mold growth are the bathroom and the kitchen.  Outdoor (Seasonal) Mold Control  Positive outdoor molds via skin testing: Bipolaris (Helminthsporium), Drechslera (Curvalaria), and Mucor  Use air conditioning and keep windows closed Avoid exposure to decaying vegetation. Avoid leaf raking. Avoid grain handling. Consider wearing a face mask if working in moldy areas.    Indoor (Perennial) Mold Control   Positive indoor molds via skin testing: Fusarium, Aureobasidium (Pullulara), and Rhizopus  Maintain humidity below 50%. Clean washable surfaces with 5% bleach solution. Remove sources e.g. contaminated carpets.    Control of Dog or Cat Allergen  Avoidance is the best way to manage a dog or cat allergy. If you have a dog or cat and are allergic to dog or cats, consider removing the dog or cat from the home. If you have a dog or cat but don't want to find it a new home, or if your family wants a pet even though someone in the household is allergic, here are some strategies that may help keep symptoms at bay:  Keep the pet out of your bedroom and restrict it to only a few rooms. Be advised that keeping the dog or cat in only one room will not limit the allergens to that room. Don't pet, hug or kiss the dog or cat; if you do, wash your hands with soap and water. High-efficiency particulate air (HEPA) cleaners run continuously in a bedroom or living room can reduce allergen levels over time. Regular use of a high-efficiency vacuum cleaner or a central vacuum can reduce allergen levels. Giving your dog or cat a bath at least once a week can reduce airborne allergen.      No follow-ups on file.    Thank you for the opportunity to care for this patient.  Please do not hesitate to contact me with questions.  Thermon Leyland, FNP [facility]

## 2022-10-15 NOTE — Patient Instructions (Addendum)
Seasonal and perennial allergic rhinitis (skin test positive WU:[id number], ragweed, trees, indoor molds, outdoor molds, cat, and dog) Continue levocetirizine 5 mg once a day as needed for a runny nose or itch Consider saline nasal rinses as needed for nasal symptoms. Use this before any medicated nasal sprays for best result Restart azelastine as needed for a runny nose Continue allergen immunotherapy and have access to an epinephrine autoinjector set per protocol  Allergic conjunctivitis Some over the counter eye drops include Pataday one drop in each eye once a day as needed for red, itchy eyes OR Zaditor one drop in each eye twice a day as needed for red itchy eyes. Avoid eye drops that say red eye relief as they may contain medications that dry out your eyes.    Shortness of breath/cough May use albuterol using 2 puffs every 4-6 hours as needed for cough, wheeze, tightness in chest, or shortness of breath to see if this helps with your shortness of breath and cough.   Follow up in 12 months or sooner if needed   Reducing Pollen Exposure  The American Academy of Allergy, Asthma and Immunology suggests the following steps to reduce your exposure to pollen during allergy seasons.    Do not hang sheets or clothing out to dry; pollen may collect on these items. Do not mow lawns or spend time around freshly cut grass; mowing stirs up pollen. Keep windows closed at night.  Keep car windows closed while driving. Minimize morning activities outdoors, a time when pollen counts are usually at their highest. Stay indoors as much as possible when pollen counts or humidity is high and on windy days when pollen tends to remain in the air longer. Use air conditioning when possible.  Many air conditioners have filters that trap the pollen spores. Use a HEPA room air filter to remove pollen form the indoor air you breathe.  Control of Mold Allergen   Mold and fungi can grow on a variety of surfaces  provided certain temperature and moisture conditions exist.  Outdoor molds grow on plants, decaying vegetation and soil.  The major outdoor mold, Alternaria and Cladosporium, are found in very high numbers during hot and dry conditions.  Generally, a late Summer - Fall peak is seen for common outdoor fungal spores.  Rain will temporarily lower outdoor mold spore count, but counts rise rapidly when the rainy period ends.  The most important indoor molds are Aspergillus and Penicillium.  Dark, humid and poorly ventilated basements are ideal sites for mold growth.  The next most common sites of mold growth are the bathroom and the kitchen.  Outdoor (Seasonal) Mold Control  Positive outdoor molds via skin testing: Bipolaris (Helminthsporium), Drechslera (Curvalaria), and Mucor  Use air conditioning and keep windows closed Avoid exposure to decaying vegetation. Avoid leaf raking. Avoid grain handling. Consider wearing a face mask if working in moldy areas.    Indoor (Perennial) Mold Control   Positive indoor molds via skin testing: Fusarium, Aureobasidium (Pullulara), and Rhizopus  Maintain humidity below 50%. Clean washable surfaces with 5% bleach solution. Remove sources e.g. contaminated carpets.    Control of Dog or Cat Allergen  Avoidance is the best way to manage a dog or cat allergy. If you have a dog or cat and are allergic to dog or cats, consider removing the dog or cat from the home. If you have a dog or cat but don't want to find it a new home, or if your family wants  a pet even though someone in the household is allergic, here are some strategies that may help keep symptoms at bay:  Keep the pet out of your bedroom and restrict it to only a few rooms. Be advised that keeping the dog or cat in only one room will not limit the allergens to that room. Don't pet, hug or kiss the dog or cat; if you do, wash your hands with soap and water. High-efficiency particulate air (HEPA)  cleaners run continuously in a bedroom or living room can reduce allergen levels over time. Regular use of a high-efficiency vacuum cleaner or a central vacuum can reduce allergen levels. Giving your dog or cat a bath at least once a week can reduce airborne allergen.

## 2022-10-16 ENCOUNTER — Ambulatory Visit: Payer: Self-pay

## 2022-10-16 ENCOUNTER — Other Ambulatory Visit: Payer: Self-pay

## 2022-10-16 ENCOUNTER — Encounter: Payer: Self-pay | Admitting: Family Medicine

## 2022-10-16 ENCOUNTER — Ambulatory Visit (INDEPENDENT_AMBULATORY_CARE_PROVIDER_SITE_OTHER): Payer: PPO | Admitting: Family Medicine

## 2022-10-16 VITALS — BP 110/70 | HR 74 | Temp 97.9°F | Resp 16 | Ht 66.5 in | Wt 177.2 lb

## 2022-10-16 DIAGNOSIS — M950 Acquired deformity of nose: Secondary | ICD-10-CM | POA: Diagnosis not present

## 2022-10-16 DIAGNOSIS — J3089 Other allergic rhinitis: Secondary | ICD-10-CM

## 2022-10-16 DIAGNOSIS — G500 Trigeminal neuralgia: Secondary | ICD-10-CM

## 2022-10-16 DIAGNOSIS — J302 Other seasonal allergic rhinitis: Secondary | ICD-10-CM | POA: Diagnosis not present

## 2022-10-16 DIAGNOSIS — R0602 Shortness of breath: Secondary | ICD-10-CM

## 2022-10-16 DIAGNOSIS — J309 Allergic rhinitis, unspecified: Secondary | ICD-10-CM

## 2022-10-16 MED ORDER — LEVOCETIRIZINE DIHYDROCHLORIDE 5 MG PO TABS: 5.0000 mg | ORAL_TABLET | Freq: Every day | ORAL | 0 refills | Status: DC | PRN

## 2022-10-16 MED ORDER — AZELASTINE HCL 0.1 % NA SOLN: NASAL | 5 refills | Status: AC

## 2022-10-18 ENCOUNTER — Encounter: Payer: Self-pay | Admitting: Family Medicine

## 2022-10-23 ENCOUNTER — Ambulatory Visit (INDEPENDENT_AMBULATORY_CARE_PROVIDER_SITE_OTHER): Payer: PPO

## 2022-10-23 DIAGNOSIS — J309 Allergic rhinitis, unspecified: Secondary | ICD-10-CM

## 2022-11-24 DIAGNOSIS — G508 Other disorders of trigeminal nerve: Secondary | ICD-10-CM | POA: Diagnosis not present

## 2022-11-24 DIAGNOSIS — M952 Other acquired deformity of head: Secondary | ICD-10-CM | POA: Diagnosis not present

## 2022-11-24 DIAGNOSIS — S0993XS Unspecified injury of face, sequela: Secondary | ICD-10-CM | POA: Diagnosis not present

## 2022-11-30 ENCOUNTER — Ambulatory Visit: Payer: PPO | Admitting: Internal Medicine

## 2022-12-09 NOTE — Progress Notes (Signed)
History of Present Illness:  Future Appointments  08/20/2022   Wellness - [facility]     Date Time Provider Department  12/10/2022                 3 mo ov  3:30 PM Lucky Cowboy, MD [facility]  03/16/2023                cpe  2:00 PM Adela Glimpse, NP [facility]  03/18/2023 12:45 PM Sherrie George, MD [facility]  07/16/2023 11:00 AM Alfonse Spruce, MD [facility]  08/02/2023                  wellness  9:00 AM Adela Glimpse, NP [facility]                                                      This very nice 67 y.o. DWF is monitored expectantly for elevated BP, HLD, Pre-Diabetes , Recurrent Depression   and Vitamin D Deficiency.  Other problems include GERD & Vit B12 deficiency.                                                          Patient has undergone multiple reconstructive procedures of a nasal deformity attributed nasal steroid injections for Discoid Lupus. She also has undergone multiple eye procedures for macular holes. She relates that she has other "plastic" nasal procedures placed.    Patient is followed expectantly for elevated  BP & BP has been controlled at home. She does have hx/o a RBBB. Today's BP is at goal - 104/60. Patient has had no complaints of any cardiac type chest pain, palpitations, dyspnea / orthopnea / PND, dizziness, claudication, or dependent edema.  BP Readings from Last 3 Encounters:  12/10/22 104/60  10/16/22 110/70  08/19/22 112/64                                                     Hyperlipidemia is controlled with diet & meds. Patient denies myalgias or other med SE's. Last Lipids were not at [facility]; :  Lab Results  Component Value Date   CHOL 193 08/19/2022   HDL 52 08/19/2022   LDLCALC 111 (H) 08/19/2022   TRIG 188 (H) 08/19/2022   CHOLHDL 3.7 08/19/2022     Also, the patient has history of  PreDiabetes and has had no symptoms of reactive hypoglycemia, diabetic polys, paresthesias or visual blurring.  Last A1c was normal & at goal  :  Lab Results  Component Value Date   HGBA1C 5.6 08/19/2022                                                        Further, the patient also has history of Vitamin D Deficiency and supplements vitamin D without any suspected side-effects. Last vitamin D  was at goal :  Lab Results  Component Value Date   VD25OH 73 08/19/2022     Current Outpatient Medications on File Prior to Visit  Medication Sig   Acetaminophen (TYLENOL PO) Take by mouth.   Ascorbic Acid (VITAMIN C PO) Take 1 tablet by mouth daily.   ASHWAGANDHA PO Take by mouth.   azelastine (ASTELIN) 0.1 % nasal spray Apply 1 spray each nostril twice a day AS NEEDED for runny nose/drainage down throat.   Cholecalciferol (VITAMIN D3) 25 MCG (1000 UT) CAPS Take 1 capsule by mouth daily.   EPINEPHrine 0.3 mg/0.3 mL IJ SOAJ injection Inject 0.3 mg into the muscle as needed for anaphylaxis.   levocetirizine (XYZAL) 5 MG tablet Take 1 tablet (5 mg total) by mouth daily as needed for allergies (Can take an extra dose during flare ups.).   Multiple Vitamins-Minerals (MULTIVITAMIN WITH MINERALS) tablet Take 1 tablet by mouth daily.   Omega-3 Fatty Acids (FISH OIL) 1200 MG CAPS Take 2 capsules by mouth daily.   OVER THE COUNTER MEDICATION Take 1 tablet by mouth daily. Allergy meds   Polyethylene Glycol 3350 (MIRALAX PO) Take 17 g by mouth as needed.   Rimegepant Sulfate (NURTEC) 75 MG TBDP Take 1 tab for migraine; can repeat in 2 hours if needed.   vitamin B-12 (CYANOCOBALAMIN) 500 MCG tablet Take 500 mcg by mouth daily.    Allergies  Allergen Reactions   Pregabalin Swelling   Diphenhydramine Nausea And Vomiting    Severe headaches    Nitrous Oxide Other (See Comments)    Pt has gas bubble in right eye- will cause blindness per pt    Sulfa Antibiotics Other (See Comments)    Headache     PMHx:   Past Medical History:  Diagnosis Date   Allergy    Anxiety    situational   Arthritis    B12 deficiency    hx of , no longer  taking medication   Cataracts, bilateral    Constipation    Depression    situational   Esophageal stricture    Family history of adverse reaction to anesthesia    mom had problems waking up   Fatty liver    GERD (gastroesophageal reflux disease)    Headache    migraines    Macular hole of left eye    Retinal detachment with multiple breaks, right 07/27/2017   Trigeminal neuralgia    right side of face is numb   Unspecified vitamin D deficiency    Immunization History  Administered Date(s) Administered   Influenza, High Dose  06/26/2021   Influenza 02/15/2018, 02/16/2020   PPD Test 05/31/2013   Pneumococcal -23 07/10/2020   Tdap 05/31/2013   Past Surgical History:  Procedure Laterality Date   25 GAUGE PARS PLANA VITRECTOMY WITH 20 GAUGE MVR PORT Left 11/09/2017   25 GAUGE PARS PLANA VITRECTOMY WITH 20 GAUGE MVR PORT Left 11/09/2017   Procedure: 25 GAUGE PARS PLANA VITRECTOMY WITH 20 GAUGE MVR PORT;  Surgeon: Sherrie George, MD;  Location[facility] OR;  Service: Ophthalmology;  Laterality: Left;   25 GAUGE PARS PLANA VITRECTOMY WITH 20 GAUGE MVR PORT FOR MACULAR HOLE Left 03/05/2015   Procedure: 25 GAUGE PARS PLANA VITRECTOMY WITH 20 GAUGE MVR PORT FOR MACULAR HOLE;  Surgeon: Sherrie George, MD;  Location: [facility] OR;  Service: Ophthalmology;  Laterality: Left;   25 GAUGE PARS PLANA VITRECTOMY WITH 20 GAUGE MVR PORT FOR MACULAR HOLE Right 06/22/2017  25 GAUGE PARS PLANA VITRECTOMY WITH 20 GAUGE MVR PORT FOR MACULAR HOLE Right 06/22/2017   Procedure: 25 GAUGE PARS PLANA VITRECTOMY WITH 20 GAUGE MVR PORT FOR MACULAR HOLE;  Surgeon: Sherrie George, MD;  Location: [facility] OR;  Service: Ophthalmology;  Laterality: Right;   ABDOMINAL HYSTERECTOMY     Total   APPENDECTOMY     CESAREAN SECTION     COLONOSCOPY     ESOPHAGOGASTRODUODENOSCOPY (EGD) WITH ESOPHAGEAL DILATION     EYE SURGERY     GAS/FLUID EXCHANGE Left 03/05/2015   Procedure: GAS/FLUID EXCHANGE;  Surgeon: Sherrie George, MD;   Location: [facility] OR;  Service: Ophthalmology;  Laterality: Left;  CF38   GAS/FLUID EXCHANGE Right 06/22/2017   Procedure: GAS/FLUID EXCHANGE;  Surgeon: Sherrie George, MD;  Location: [facility] OR;  Service: Ophthalmology;  Laterality: Right;   GAS/FLUID EXCHANGE Right 07/27/2017   Procedure: GAS/FLUID EXCHANGE;  Surgeon: Sherrie George, MD;  Location: [facility] OR;  Service: Ophthalmology;  Laterality: Right;   GAS/FLUID EXCHANGE Left 11/09/2017   Procedure: GAS/FLUID EXCHANGE;  Surgeon: Sherrie George, MD;  Location: [facility] OR;  Service: Ophthalmology;  Laterality: Left;   knot removed from right side of abdomen     LASER PHOTO ABLATION Left 03/05/2015   Procedure: LASER PHOTO ABLATION;  Surgeon: Sherrie George, MD;  Location: [facility] OR;  Service: Ophthalmology;  Laterality: Left;  Headscope Laser   LASER PHOTO ABLATION Right 06/22/2017   Procedure: LASER PHOTO ABLATION;  Surgeon: Sherrie George, MD;  Location: [facility] OR;  Service: Ophthalmology;  Laterality: Right;   LASER PHOTO ABLATION Right 07/27/2017   Procedure: LASER PHOTO ABLATION;  Surgeon: Sherrie George, MD;  Location: [facility] OR;  Service: Ophthalmology;  Laterality: Right;   LASER PHOTO ABLATION Left 11/09/2017   Procedure: LASER PHOTO ABLATION;  Surgeon: Sherrie George, MD;  Location: [facility] OR;  Service: Ophthalmology;  Laterality: Left;   MEMBRANE PEEL Left 03/05/2015   Procedure: MEMBRANE PEEL;  Surgeon: Sherrie George, MD;  Location: [facility] OR;  Service: Ophthalmology;  Laterality: Left;   MEMBRANE PEEL Right 06/22/2017   Procedure: MEMBRANE PEEL;  Surgeon: Sherrie George, MD;  Location: [facility] OR;  Service: Ophthalmology;  Laterality: Right;   MEMBRANE PEEL Right 07/27/2017   Procedure: MEMBRANE PEEL;  Surgeon: Sherrie George, MD;  Location: [facility] OR;  Service: Ophthalmology;  Laterality: Right;   MEMBRANE PEEL Left 11/09/2017   Procedure: MEMBRANE PEEL;  Surgeon: Sherrie George, MD;  Location: [facility] OR;  Service: Ophthalmology;  Laterality: Left;   SCLERAL BUCKLE  WITH POSSIBLE 25 GAUGE PARS PLANA VITRECTOMY Right 07/27/2017   Procedure: SCLERAL BUCKLE  25 GAUGE PARS PLANA VITRECTOMY, C3F8 INJECTION REPAIR OF COMPLEX TRACTION OF RETINAL DETACHMENT;  Surgeon: Sherrie George, MD;  Location[facility] [place];  Service: Ophthalmology;  Laterality: Right;   SERUM PATCH Left 03/05/2015   Procedure: SERUM PATCH;  Surgeon: Sherrie George, MD;  Location: [facility] OR;  Service: Ophthalmology;  Laterality: Left;   SERUM PATCH Right 06/22/2017   Procedure: SERUM PATCH;  Surgeon: Sherrie George, MD;  Location: [facility] OR;  Service: Ophthalmology;  Laterality: Right;   SERUM PATCH Left 11/09/2017   Procedure: SERUM PATCH;  Surgeon: Sherrie George, MD;  Location: [facility] OR;  Service: Ophthalmology;  Laterality: Left;   trigeminal       FHx:    Reviewed / unchanged  SHx:    Reviewed / unchanged   Systems Review:  Constitutional: Denies fever, chills,  wt changes, headaches, insomnia, fatigue, night sweats, change in appetite. Eyes: Denies redness, blurred vision, diplopia, discharge, itchy, watery eyes.  ENT: Denies discharge, congestion, post nasal drip, epistaxis, sore throat, earache, hearing loss, dental pain, tinnitus, vertigo, sinus pain, snoring.  CV: Denies chest pain, palpitations, irregular heartbeat, syncope, dyspnea, diaphoresis, orthopnea, PND, claudication or edema. Respiratory: denies cough, dyspnea, DOE, pleurisy, hoarseness, laryngitis, wheezing.  Gastrointestinal: Denies dysphagia, odynophagia, heartburn, reflux, water brash, abdominal pain or cramps, nausea, vomiting, bloating, diarrhea, constipation, hematemesis, melena, hematochezia  or hemorrhoids. Genitourinary: Denies dysuria, frequency, urgency, nocturia, hesitancy, discharge, hematuria or flank pain. Musculoskeletal: Denies arthralgias, myalgias, stiffness, jt. swelling, pain, limping or strain/sprain.  Skin: Denies pruritus, rash, hives, warts, acne, eczema or change in skin lesion(s). Neuro: No weakness, tremor,  incoordination, spasms, paresthesia or pain. Psychiatric: Denies confusion, memory loss or sensory loss. Endo: Denies change in weight, skin or hair change.  Heme/Lymph: No excessive bleeding, bruising or enlarged lymph nodes.  Physical Exam  BP 104/60   Pulse 77   Temp 97.7 F (36.5 C)   Resp 16   Ht 5' 4.75" (1.645 m)   Wt 180 lb 6.4 oz (81.8 kg)   SpO2 97%   BMI 30.25 kg/m   Appears  well nourished, well groomed  and in no distress.  Eyes: PERRLA, EOMs, conjunctiva no swelling or erythema. Sinuses: No frontal/maxillary tenderness.  Nose w/prominent  asymtreic nasal deformity to the right.  ENT/Mouth: EAC's clear, TM's nl w/o erythema, bulging. Nares clear w/o erythema, swelling, exudates. Oropharynx clear without erythema or exudates. Oral hygiene is good. Tongue normal, non obstructing. Hearing intact.  Neck: Supple. Thyroid not palpable. Car 2+/2+ without bruits, nodes or JVD. Chest: Respirations nl with BS clear & equal w/o rales, rhonchi, wheezing or stridor.  Cor: Heart sounds normal w/ regular rate and rhythm without sig. murmurs, gallops, clicks or rubs. Peripheral pulses normal and equal  without edema.  Abdomen: Soft & bowel sounds normal. Non-tender w/o guarding, rebound, hernias, masses or organomegaly.  Lymphatics: Unremarkable.  Musculoskeletal: Full ROM all peripheral extremities, joint stability, 5/5 strength and normal gait.  Skin: Warm, dry without exposed rashes, lesions or ecchymosis apparent.  Neuro: Cranial nerves intact, reflexes equal bilaterally. Sensory-motor testing grossly intact. Tendon reflexes grossly intact.  Pysch: Alert & oriented x 3.  Insight and judgement nl & appropriate. No ideations.  Assessment and Plan:   1. Hypertension screen  -  monitor blood pressure at home.  - Continue DASH diet.  Reminder to go to the ER if any CP,  SOB, nausea, dizziness, severe HA, changes vision/speech.   - CBC with Differential/Platelet - COMPLETE  METABOLIC PANEL WITH GFR - Magnesium - TSH   2. Hyperlipidemia, mixed  - Continue diet/meds, exercise,& lifestyle modifications.  - Continue monitor periodic cholesterol/liver & renal functions    - Lipid panel - TSH   3. Abnormal glucose  - Continue diet, exercise  - Lifestyle modifications.  - Monitor appropriate labs    - Hemoglobin A1c - Insulin, random   4. Vitamin D deficiency  - Continue supplementation   - VITAMIN D 25 Hydroxy   5. B12 deficiency  - Vitamin B12       6. Gastroesophageal reflux disease    7. Medication management  - Vitamin B12 - CBC with Differential/Platelet - COMPLETE METABOLIC PANEL WITH GFR - Magnesium - Lipid panel - TSH - Hemoglobin A1c - Insulin, random - VITAMIN D 25 Hydroxy        Discussed  regular exercise, BP monitoring, weight control to achieve/maintain BMI less than 25 and discussed med and SE's. Recommended labs to assess and monitor clinical status with further disposition pending results of labs.  I discussed the assessment and treatment plan with the patient. The patient was provided an opportunity to ask questions and all were answered. The patient agreed with the plan and demonstrated an understanding of the instructions.  I provided over 30 minutes of exam, counseling, chart review and  complex critical decision making.  Marinus Maw, MD .

## 2022-12-10 ENCOUNTER — Encounter: Payer: Self-pay | Admitting: Internal Medicine

## 2022-12-10 ENCOUNTER — Other Ambulatory Visit: Payer: Self-pay | Admitting: Nurse Practitioner

## 2022-12-10 ENCOUNTER — Ambulatory Visit (INDEPENDENT_AMBULATORY_CARE_PROVIDER_SITE_OTHER): Payer: PPO | Admitting: Internal Medicine

## 2022-12-10 VITALS — BP 104/60 | HR 77 | Temp 97.7°F | Resp 16 | Ht 64.75 in | Wt 180.4 lb

## 2022-12-10 DIAGNOSIS — Z136 Encounter for screening for cardiovascular disorders: Secondary | ICD-10-CM

## 2022-12-10 DIAGNOSIS — E559 Vitamin D deficiency, unspecified: Secondary | ICD-10-CM | POA: Diagnosis not present

## 2022-12-10 DIAGNOSIS — R7309 Other abnormal glucose: Secondary | ICD-10-CM | POA: Diagnosis not present

## 2022-12-10 DIAGNOSIS — Z79899 Other long term (current) drug therapy: Secondary | ICD-10-CM

## 2022-12-10 DIAGNOSIS — Z1231 Encounter for screening mammogram for malignant neoplasm of breast: Secondary | ICD-10-CM

## 2022-12-10 DIAGNOSIS — E538 Deficiency of other specified B group vitamins: Secondary | ICD-10-CM | POA: Diagnosis not present

## 2022-12-10 DIAGNOSIS — E782 Mixed hyperlipidemia: Secondary | ICD-10-CM | POA: Diagnosis not present

## 2022-12-10 DIAGNOSIS — K2100 Gastro-esophageal reflux disease with esophagitis, without bleeding: Secondary | ICD-10-CM

## 2022-12-10 LAB — CBC WITH DIFFERENTIAL/PLATELET
Absolute Monocytes: 397 cells/uL (ref 200–950)
Basophils Absolute: 43 cells/uL (ref 0–200)
Basophils Relative: 0.7 %
Eosinophils Absolute: 92 cells/uL (ref 15–500)
Eosinophils Relative: 1.5 %
HCT: 36.6 % (ref 35.0–45.0)
Hemoglobin: 12 g/dL (ref 11.7–15.5)
Lymphs Abs: 2532 cells/uL (ref 850–3900)
MCH: 29 pg (ref 27.0–33.0)
MCHC: 32.8 g/dL (ref 32.0–36.0)
MCV: 88.4 fL (ref 80.0–100.0)
MPV: 10.1 fL (ref 7.5–12.5)
Monocytes Relative: 6.5 %
Neutro Abs: 3038 cells/uL (ref 1500–7800)
Neutrophils Relative %: 49.8 %
Platelets: 312 10*3/uL (ref 140–400)
RBC: 4.14 10*6/uL (ref 3.80–5.10)
Total Lymphocyte: 41.5 %

## 2022-12-10 MED ORDER — ESOMEPRAZOLE MAGNESIUM 40 MG PO CPDR: DELAYED_RELEASE_CAPSULE | ORAL | 3 refills | Status: AC

## 2022-12-10 MED ORDER — ESOMEPRAZOLE MAGNESIUM 40 MG PO CPDR
40.0000 mg | DELAYED_RELEASE_CAPSULE | Freq: Two times a day (BID) | ORAL | 11 refills | Status: DC
Start: 2022-12-10 — End: 2022-12-10

## 2022-12-10 NOTE — Patient Instructions (Signed)

## 2022-12-11 NOTE — Progress Notes (Signed)
^<^<^<^<^<^<^<^<^<^<^<^<^<^<^<^<^<^<^<^<^<^<^<^<^<^<^<^<^<^<^<^<^<^<^<^<^ ^>^>^>^>^>^>^>^>^>^>^>>^>^>^>^>^>^>^>^>^>^>^>^>^>^>^>^>^>^>^>^>^>^>^>^>^>  -  Test results slightly outside the reference range are not unusual. If there is anything important, I will review this with you,  otherwise it is considered normal test values.  If you have further questions,  please do not hesitate to contact me at the office or via My Chart.   ^<^<^<^<^<^<^<^<^<^<^<^<^<^<^<^<^<^<^<^<^<^<^<^<^<^<^<^<^<^<^<^<^<^<^<^<^ ^>^>^>^>^>^>^>^>^>^>^>^>^>^>^>^>^>^>^>^>^>^>^>^>^>^>^>^>^>^>^>^>^>^>^>^>^  -   Vit B 12 level is very high, so may cut back to take just 2 x / week   ^>^>^>^>^>^>^>^>^>^>^>^>^>^>^>^>^>^>^>^>^>^>^>^>^>^>^>^>^>^>^>^>^>^>^>^>^  -   Chol = 185   &   LDL 99   - Great     - Very low risk for Heart Attack  / Stroke  ^>^>^>^>^>^>^>^>^>^>^>^>^>^>^>^>^>^>^>^>^>^>^>^>^>^>^>^>^>^>^>^>^>^>^>^>^  -  Vitamin D = 67 - Excellent   - Vitamin D goal is between 70-100.   - Please continue  your Vitamin D   same    - It is very important as a natural anti-inflammatory and helping the                           immune system protect against viral infections, like the Covid-19    helping hair, skin, and nails, as well as reducing stroke and heart attack risk.   - It helps your bones and helps with mood.  - It also decreases numerous cancer risks   - Low Vit D is associated with a 200-300% higher risk for CANCER   and 200-300% higher risk for HEART   ATTACK  &  STROKE.    - It is also associated with higher death rate at younger ages,   autoimmune diseases like Rheumatoid arthritis, Lupus, Multiple Sclerosis.     - Also many other serious conditions, like Depression, Alzheimer's   Dementia,    muscle aches, fatigue, fibromyalgia   ^>^>^>^>^>^>^>^>^>^>^>^>^>^>^>^>^>^>^>^>^>^>^>^>^>^>^>^>^>^>^>^>^>^>^>^>^  -  All Else - CBC - Kidneys - Electrolytes - Liver - Magnesium & Thyroid    - all  Normal  / OK  ^>^>^>^>^>^>^>^>^>^>^>^>^>^>^>^>^>^>^>^>^>^>^>^>^>^>^>^>^>^>^>^>^>^>^>^>^ ^>^>^>^>^>^>^>^>^>^>^>^>^>^>^>^>^>^>^>^>^>^>^>^>^>^>^>^>^>^>^>^>^>^>^>^>^  -   Keep up the Great Work   !  ^>^>^>^>^>^>^>^>^>^>^>^>^>^>^>^>^>^>^>^>^>^>^>^>^>^>^>^>^>^>^>^>^>^>^>^>^ ^>^>^>^>^>^>^>^>^>^>^>^>^>^>^>^>^>^>^>^>^>^>^>^>^>^>^>^>^>^>^>^>^>^>^>^>^

## 2022-12-12 ENCOUNTER — Encounter: Payer: Self-pay | Admitting: Internal Medicine

## 2023-01-29 ENCOUNTER — Telehealth: Payer: Self-pay | Admitting: Family Medicine

## 2023-01-29 MED ORDER — LEVOCETIRIZINE DIHYDROCHLORIDE 5 MG PO TABS: 5.0000 mg | ORAL_TABLET | Freq: Every day | ORAL | 5 refills | Status: AC | PRN

## 2023-01-29 NOTE — Telephone Encounter (Signed)
I called patient to verify pharmacy to send in xyzal refills. I informed refills have been sent in.

## 2023-01-29 NOTE — Telephone Encounter (Signed)
Patient called stating she needs a refill on levocetirizine. Patient states the pharmacy told her that there were no more refills on the medication.

## 2023-02-01 ENCOUNTER — Encounter: Payer: PPO | Admitting: Nurse Practitioner

## 2023-02-03 ENCOUNTER — Ambulatory Visit: Payer: PPO | Admitting: Allergy & Immunology

## 2023-02-08 ENCOUNTER — Emergency Department (HOSPITAL_COMMUNITY): Payer: PPO

## 2023-02-08 ENCOUNTER — Other Ambulatory Visit: Payer: Self-pay

## 2023-02-08 ENCOUNTER — Emergency Department (HOSPITAL_COMMUNITY)
Admission: EM | Admit: 2023-02-08 | Discharge: 2023-02-09 | Disposition: A | Discharge status: Home / Self Care | Payer: PPO | Attending: Emergency Medicine | Admitting: Emergency Medicine

## 2023-02-08 ENCOUNTER — Encounter (HOSPITAL_COMMUNITY): Payer: Self-pay | Admitting: Emergency Medicine

## 2023-02-08 DIAGNOSIS — R0789 Other chest pain: Secondary | ICD-10-CM | POA: Diagnosis not present

## 2023-02-08 DIAGNOSIS — J9811 Atelectasis: Secondary | ICD-10-CM | POA: Diagnosis not present

## 2023-02-08 DIAGNOSIS — R079 Chest pain, unspecified: Secondary | ICD-10-CM | POA: Diagnosis not present

## 2023-02-08 DIAGNOSIS — I700 Atherosclerosis of aorta: Secondary | ICD-10-CM | POA: Diagnosis not present

## 2023-02-08 LAB — BASIC METABOLIC PANEL
Anion gap: 11 (ref 5–15)
BUN: 21 mg/dL (ref 8–23)
CO2: 25 mmol/L (ref 22–32)
Calcium: 8.9 mg/dL (ref 8.9–10.3)
Chloride: 102 mmol/L (ref 98–111)
Creatinine, Ser: 0.8 mg/dL (ref 0.44–1.00)
GFR, Estimated: >60 mL/min (ref >60)
Glucose, Bld: 158 mg/dL — ABNORMAL HIGH (ref 70–99)
Potassium: 3.7 mmol/L (ref 3.5–5.1)
Sodium: 138 mmol/L (ref 135–145)

## 2023-02-08 LAB — CBC
HCT: 41.6 % (ref 36.0–46.0)
Hemoglobin: 13 g/dL (ref 12.0–15.0)
MCH: 28.6 pg (ref 26.0–34.0)
MCHC: 31.3 g/dL (ref 30.0–36.0)
MCV: 91.4 fL (ref 80.0–100.0)
Platelets: 335 10*3/uL (ref 150–400)
RBC: 4.55 MIL/uL (ref 3.87–5.11)
RDW: 13.2 % (ref 11.5–15.5)
WBC: 12.4 10*3/uL — ABNORMAL HIGH (ref 4.0–10.5)
nRBC: 0 % (ref 0.0–0.2)

## 2023-02-08 LAB — TROPONIN I (HIGH SENSITIVITY)
Troponin I (High Sensitivity): 2 ng/L (ref <18)
Troponin I (High Sensitivity): 2 ng/L (ref <18)

## 2023-02-08 MED ORDER — IOHEXOL 350 MG/ML SOLN
100.0000 mL | Freq: Once | INTRAVENOUS | Status: AC | PRN
  Administered 2023-02-09: 100 mL via INTRAVENOUS

## 2023-02-08 NOTE — ED Triage Notes (Signed)
Pt complains of chest pain that started over the weekend but tonight had bad pain in back and rib cage that felt like pt was being squeezed. Pt states it feels hard to take a deep breath. Also started to feel nauseated tonight.

## 2023-02-08 NOTE — ED Provider Notes (Signed)
[place] EMERGENCY DEPARTMENT AT [facility] Provider Note   CSN: [id number] Arrival date & time: 02/08/23  1931     History {Add pertinent medical, surgical, social history, OB history to HPI:1} Chief Complaint  Patient presents with   Chest Pain    Margaret Gilmore is a 67 y.o. female.  Presents to the emergency department for evaluation of chest pain that has been ongoing for several days.  Patient first noted that she felt some tightness with her breathing like she could not take a deep breath.  She has now developed some pain in bilateral ribs.  No history of heart disease.  No history of blood clots.  She did, however, recently take a car trip to [facility].       Home Medications Prior to Admission medications   Medication Sig Start Date End Date Taking? Authorizing Provider  Acetaminophen (TYLENOL PO) Take by mouth.    [provider]  Ascorbic Acid (VITAMIN C PO) Take 1 tablet by mouth daily.    [provider]  ASHWAGANDHA PO Take by mouth.    [provider]  azelastine (ASTELIN) 0.1 % nasal spray Apply 1 spray each nostril twice a day AS NEEDED for runny nose/drainage down throat. 10/16/22   Hetty Blend, FNP  Cholecalciferol (VITAMIN D3) 25 MCG (1000 UT) CAPS Take 1 capsule by mouth daily.    [provider]  EPINEPHrine 0.3 mg/0.3 mL IJ SOAJ injection Inject 0.3 mg into the muscle as needed for anaphylaxis. 06/15/22   Alfonse Spruce, MD  esomeprazole (NEXIUM) 40 MG capsule Take  1 capsule   Daily  to Prevent Heartburn & Indigestion 12/10/22   Lucky Cowboy, MD  levocetirizine (XYZAL) 5 MG tablet Take 1 tablet (5 mg total) by mouth daily as needed for allergies (Can take an extra dose during flare ups.). 01/29/23   Hetty Blend, FNP  Multiple Vitamins-Minerals (MULTIVITAMIN WITH MINERALS) tablet Take 1 tablet by mouth daily.    [provider]  Omega-3 Fatty Acids (FISH OIL) 1200 MG CAPS Take 2 capsules by  mouth daily. 02/21/21   [provider]  OVER THE COUNTER MEDICATION Take 1 tablet by mouth daily. Allergy meds    [provider]  Polyethylene Glycol 3350 (MIRALAX PO) Take 17 g by mouth as needed.    [provider]  Rimegepant Sulfate (NURTEC) 75 MG TBDP Take 1 tab for migraine; can repeat in 2 hours if needed. 04/29/21   Judd Gaudier, NP  vitamin B-12 (CYANOCOBALAMIN) 500 MCG tablet Take 500 mcg by mouth daily.    [provider]      Allergies    Pregabalin, Diphenhydramine, Nitrous oxide, and Sulfa antibiotics    Review of Systems   Review of Systems  Physical Exam Updated Vital Signs BP 131/65 (BP Location: Right Arm)   Pulse 74   Temp 98.1 F (36.7 C) (Oral)   Resp 16   Ht 5\' 4"  (1.626 m)   Wt 81.8 kg   SpO2 100%   BMI 30.95 kg/m  Physical Exam Vitals and nursing note reviewed.  Constitutional:      General: She is not in acute distress.    Appearance: She is well-developed.  HENT:     Head: Normocephalic and atraumatic.     Mouth/Throat:     Mouth: Mucous membranes are moist.  Eyes:     General: Vision grossly intact. Gaze aligned appropriately.     Extraocular Movements:  Extraocular movements intact.     Conjunctiva/sclera: Conjunctivae normal.  Cardiovascular:     Rate and Rhythm: Normal rate and regular rhythm.     Pulses: Normal pulses.     Heart sounds: Normal heart sounds, S1 normal and S2 normal. No murmur heard.    No friction rub. No gallop.  Pulmonary:     Effort: Pulmonary effort is normal. No respiratory distress.     Breath sounds: Normal breath sounds.  Abdominal:     General: Bowel sounds are normal.     Palpations: Abdomen is soft.     Tenderness: There is no abdominal tenderness. There is no guarding or rebound.     Hernia: No hernia is present.  Musculoskeletal:        General: No swelling.     Cervical back: Full passive range of motion without pain, normal range of motion and neck supple. No  spinous process tenderness or muscular tenderness. Normal range of motion.     Right lower leg: No edema.     Left lower leg: No edema.  Skin:    General: Skin is warm and dry.     Capillary Refill: Capillary refill takes less than 2 seconds.     Findings: No ecchymosis, erythema, rash or wound.  Neurological:     General: No focal deficit present.     Mental Status: She is alert and oriented to person, place, and time.     GCS: GCS eye subscore is 4. GCS verbal subscore is 5. GCS motor subscore is 6.     Cranial Nerves: Cranial nerves 2-12 are intact.     Sensory: Sensation is intact.     Motor: Motor function is intact.     Coordination: Coordination is intact.  Psychiatric:        Attention and Perception: Attention normal.        Mood and Affect: Mood normal.        Speech: Speech normal.        Behavior: Behavior normal.     ED Results / Procedures / Treatments   Labs (all labs ordered are listed, but only abnormal results are displayed) Labs Reviewed  BASIC METABOLIC PANEL - Abnormal; Notable for the following components:      Result Value   Glucose, Bld 158 (*)    All other components within normal limits  CBC - Abnormal; Notable for the following components:   WBC 12.4 (*)    All other components within normal limits  TROPONIN I (HIGH SENSITIVITY)  TROPONIN I (HIGH SENSITIVITY)    EKG EKG Interpretation Date/Time:  Monday February 08 2023 19:36:34 EDT Ventricular Rate:  71 PR Interval:  170 QRS Duration:  86 QT Interval:  402 QTC Calculation: 436 R Axis:   0  Text Interpretation: Normal sinus rhythm Normal ECG When compared with ECG of 22-Jun-2017 09:21, No significant change was found Confirmed by Gilda Crease [phone]) on 02/08/2023 11:37:53 PM  Radiology DG Chest 2 View  Result Date: 02/08/2023 CLINICAL DATA:  Chest pain. EXAM: CHEST - 2 VIEW COMPARISON:  January 29, 2022 FINDINGS: The heart size and mediastinal contours are within normal  limits. There is no evidence of an acute infiltrate, pleural effusion or pneumothorax. The visualized skeletal structures are unremarkable. IMPRESSION: No active cardiopulmonary disease. Electronically Signed   By: Aram Candela M.D.   On: 02/08/2023 21:24    Procedures Procedures  {Document cardiac monitor, telemetry assessment procedure when appropriate:1}  Medications Ordered  in ED Medications - No data to display  ED Course/ Medical Decision Making/ A&P   {   Click here for ABCD2, HEART and other calculatorsREFRESH Note before signing :1}                              Medical Decision Making Amount and/or Complexity of Data Reviewed External Data Reviewed: ECG and notes. Labs: ordered. Decision-making details documented in ED Course. Radiology: ordered and independent interpretation performed. Decision-making details documented in ED Course. ECG/medicine tests: ordered and independent interpretation performed. Decision-making details documented in ED Course.   Differential Diagnosis considered includes, but not limited to: STEMI; NSTEMI; myocarditis; pericarditis; pulmonary embolism; aortic dissection; pneumothorax; pneumonia; gastritis; musculoskeletal pain   {Document critical care time when appropriate:1} {Document review of labs and clinical decision tools ie heart score, Chads2Vasc2 etc:1}  {Document your independent review of radiology images, and any outside records:1} {Document your discussion with family members, caretakers, and with consultants:1} {Document social determinants of health affecting pt's care:1} {Document your decision making why or why not admission, treatments were needed:1} Final Clinical Impression(s) / ED Diagnoses Final diagnoses:  None    Rx / DC Orders ED Discharge Orders     None

## 2023-02-09 DIAGNOSIS — J9811 Atelectasis: Secondary | ICD-10-CM | POA: Diagnosis not present

## 2023-02-09 DIAGNOSIS — I700 Atherosclerosis of aorta: Secondary | ICD-10-CM | POA: Diagnosis not present

## 2023-02-09 DIAGNOSIS — R079 Chest pain, unspecified: Secondary | ICD-10-CM | POA: Diagnosis not present

## 2023-02-09 NOTE — ED Notes (Signed)
Patient transported to CT 

## 2023-02-10 ENCOUNTER — Ambulatory Visit (INDEPENDENT_AMBULATORY_CARE_PROVIDER_SITE_OTHER): Payer: PPO | Admitting: Nurse Practitioner

## 2023-02-10 ENCOUNTER — Encounter: Payer: Self-pay | Admitting: Nurse Practitioner

## 2023-02-10 VITALS — BP 112/70 | HR 70 | Temp 97.9°F | Ht 64.75 in | Wt 182.2 lb

## 2023-02-10 DIAGNOSIS — Z09 Encounter for follow-up examination after completed treatment for conditions other than malignant neoplasm: Secondary | ICD-10-CM | POA: Diagnosis not present

## 2023-02-10 DIAGNOSIS — D72828 Other elevated white blood cell count: Secondary | ICD-10-CM

## 2023-02-10 DIAGNOSIS — R0789 Other chest pain: Secondary | ICD-10-CM

## 2023-02-10 LAB — CBC WITH DIFFERENTIAL/PLATELET
Absolute Monocytes: 423 cells/uL (ref 200–950)
Basophils Absolute: 52 cells/uL (ref 0–200)
Basophils Relative: 0.8 %
Eosinophils Absolute: 117 cells/uL (ref 15–500)
Eosinophils Relative: 1.8 %
HCT: 38.7 % (ref 35.0–45.0)
Hemoglobin: 12.5 g/dL (ref 11.7–15.5)
Lymphs Abs: 2483 cells/uL (ref 850–3900)
MCH: 29.1 pg (ref 27.0–33.0)
MCHC: 32.3 g/dL (ref 32.0–36.0)
MCV: 90 fL (ref 80.0–100.0)
MPV: 10 fL (ref 7.5–12.5)
Monocytes Relative: 6.5 %
Neutro Abs: 3426 cells/uL (ref 1500–7800)
Neutrophils Relative %: 52.7 %
Platelets: 365 10*3/uL (ref 140–400)
RBC: 4.3 10*6/uL (ref 3.80–5.10)
RDW: 12.6 % (ref 11.0–15.0)
Total Lymphocyte: 38.2 %
WBC: 6.5 10*3/uL (ref 3.8–10.8)

## 2023-02-10 NOTE — Patient Instructions (Signed)
Chest Wall Pain Chest wall pain is pain in or around the bones and muscles of your chest. Sometimes, an injury causes this pain. Excessive coughing or overuse of arm and chest muscles may also cause chest wall pain. Sometimes, the cause may not be known. This pain may take several weeks or longer to get better. Follow these instructions at home: Managing pain, stiffness, and swelling  If directed, put ice on the painful area: Put ice in a plastic bag. Place a towel between your skin and the bag. Leave the ice on for 20 minutes, 2-3 times per day. Activity Rest as told by your health care provider. Avoid activities that cause pain. These include any activities that use your chest muscles or your abdominal and side muscles to lift heavy items. Ask your health care provider what activities are safe for you. General instructions  Take over-the-counter and prescription medicines only as told by your health care provider. Do not use any products that contain nicotine or tobacco, such as cigarettes, e-cigarettes, and chewing tobacco. These can delay healing after injury. If you need help quitting, ask your health care provider. Keep all follow-up visits as told by your health care provider. This is important. Contact a health care provider if: You have a fever. Your chest pain becomes worse. You have new symptoms. Get help right away if: You have nausea or vomiting. You feel sweaty or light-headed. You have a cough with mucus from your lungs (sputum) or you cough up blood. You develop shortness of breath. These symptoms may represent a serious problem that is an emergency. Do not wait to see if the symptoms will go away. Get medical help right away. Call your local emergency services (911 in the [place] Do not drive yourself to the hospital. Summary Chest wall pain is pain in or around the bones and muscles of your chest. Depending on the cause, it may be treated with ice, rest, medicines, and  avoiding activities that cause pain. Contact a health care provider if you have a fever, worsening chest pain, or new symptoms. Get help right away if you feel light-headed or you develop shortness of breath. These symptoms may be an emergency. This information is not intended to replace advice given to you by your health care provider. Make sure you discuss any questions you have with your health care provider. Document Revised: 04/27/2022 Document Reviewed: 04/27/2022 Elsevier Patient Education  2024 [employer].

## 2023-02-10 NOTE — Progress Notes (Signed)
Hospital follow up  Assessment and Plan: Hospital visit follow up for:   Hospital discharge follow-up Reviewed discharge instructions in full including medication changes, diagnostics, labs, and future follow ups appointment. All questions and concerns addressed.   - CBC with Differential/Platelet  Other chest pain Resolved Discussed importance of staying well hydrated BP well controlled without medications. Monitor for increase in GERD symptoms and/or anxiety provoking situations as a trigger. Report to ER for any increase in stroke like symptoms, including HA, N/V, paralysis, difficulty speaking, trouble walking, confusion, vision changes, CP, heart palpitations, SOB, diaphoresis.  Other elevated white blood cell (WBC) count Possible viral infection in nature  Obtain CBC for review of any trending WBC  - CBC with Differential/Platelet   All medications were reviewed with patient and fully reconciled. All questions answered fully, and patient and family members were encouraged to call the office with any further questions or concerns. Discussed goal to avoid readmission related to this diagnosis.   Over 40 minutes of exam, counseling, chart review, and complex, high/moderate level critical decision making was performed this visit.   Future Appointments  Date Time Provider Department Center  03/10/2023 10:40 AM Hetty Blend, FNP [facility] None  03/11/2023 11:00 AM [facility] DX DEXA 1 [facility]  03/11/2023 11:40 AM [facility] MM 3 [facility]  03/16/2023  2:00 PM Vayden Weinand, Archie Patten, NP [facility] None  08/02/2023  9:00 AM Marcellino Fidalgo, Archie Patten, NP [facility] None     HPI 67 y.o.female presents for follow up for transition from recent hospitalization or SNIF stay. Admit date to the hospital was 02/08/23, patient was discharged from the hospital on 02/09/23 and our clinical staff contacted the office the day after discharge to set up a follow up appointment. The discharge  summary, medications, and diagnostic test results were reviewed before meeting with the patient.   The patient was admitted for evaluation of chest pain that that had been persistent for several days. Patient first noted that she felt some tightness with her breathing like she could not take a deep breath. She has now developed some pain in bilateral ribs.  Noted recent travel via a car trip to [facility].    Overall cardiac evaluation was reassuring.    EKG revealed NSR.  CT Angio Chest PE w/wo contrast did not show evidence of pulmonary embolism or acute cardiopulmonary disease.  Chest X-ray revealed no active cardiopulmonary disease.    Troponin was negative.    Overall she reports feeling well today.  Symptoms have resolved.  She shares with me today that she was lifting heavy bags and suitcases from her trip which may have triggered the pain.  She does not know of any Covid exposure during travel.     Home health is not involved.   Images while in the hospital: CT Angio Chest Pulmonary Embolism (PE) W or WO Contrast  Result Date: 02/09/2023 CLINICAL DATA:  Chest pain. EXAM: CT ANGIOGRAPHY CHEST WITH CONTRAST TECHNIQUE: Multidetector CT imaging of the chest was performed using the standard protocol during bolus administration of intravenous contrast. Multiplanar CT image reconstructions and MIPs were obtained to evaluate the vascular anatomy. RADIATION DOSE REDUCTION: This exam was performed according to the departmental dose-optimization program which includes automated exposure control, adjustment of the mA and/or kV according to patient size and/or use of iterative reconstruction technique. CONTRAST:  OMNIPAQUE IOHEXOL 350 MG/ML SOLN COMPARISON:  None Available. FINDINGS: Cardiovascular: There is mild calcification of the aortic arch, without evidence of aortic  aneurysm. Satisfactory opacification of the pulmonary arteries to the segmental level. No evidence of pulmonary embolism.  Normal heart size. No pericardial effusion. Mediastinum/Nodes: No enlarged mediastinal, hilar, or axillary lymph nodes. Thyroid gland, trachea, and esophagus demonstrate no significant findings. Lungs/Pleura: Mild atelectasis is seen along the posterior aspect of the bilateral lower lobes. There is no evidence of an acute infiltrate, pleural effusion or pneumothorax. Upper Abdomen: Noninflamed diverticula are seen along the splenic flexure. Musculoskeletal: No chest wall abnormality. No acute or significant osseous findings. Review of the MIP images confirms the above findings. IMPRESSION: 1. No evidence of pulmonary embolism or acute cardiopulmonary disease. 2. Colonic diverticulosis. 3. Aortic atherosclerosis. Aortic Atherosclerosis (ICD10-I70.0). Electronically Signed   By: Aram Candela M.D.   On: 02/09/2023 02:33   DG Chest 2 View  Result Date: 02/08/2023 CLINICAL DATA:  Chest pain. EXAM: CHEST - 2 VIEW COMPARISON:  January 29, 2022 FINDINGS: The heart size and mediastinal contours are within normal limits. There is no evidence of an acute infiltrate, pleural effusion or pneumothorax. The visualized skeletal structures are unremarkable. IMPRESSION: No active cardiopulmonary disease. Electronically Signed   By: Aram Candela M.D.   On: 02/08/2023 21:24      Current Outpatient Medications (Cardiovascular):    EPINEPHrine 0.3 mg/0.3 mL IJ SOAJ injection, Inject 0.3 mg into the muscle as needed for anaphylaxis.  Current Outpatient Medications (Respiratory):    azelastine (ASTELIN) 0.1 % nasal spray, Apply 1 spray each nostril twice a day AS NEEDED for runny nose/drainage down throat.   levocetirizine (XYZAL) 5 MG tablet, Take 1 tablet (5 mg total) by mouth daily as needed for allergies (Can take an extra dose during flare ups.).  Current Outpatient Medications (Analgesics):    Acetaminophen (TYLENOL PO), Take by mouth.   Rimegepant Sulfate (NURTEC) 75 MG TBDP, Take 1 tab for migraine;  can repeat in 2 hours if needed.  Current Outpatient Medications (Hematological):    vitamin B-12 (CYANOCOBALAMIN) 500 MCG tablet, Take 500 mcg by mouth daily.  Current Outpatient Medications (Other):    Ascorbic Acid (VITAMIN C PO), Take 1 tablet by mouth daily.   ASHWAGANDHA PO, Take by mouth.   Cholecalciferol (VITAMIN D3) 25 MCG (1000 UT) CAPS, Take 1 capsule by mouth daily.   esomeprazole (NEXIUM) 40 MG capsule, Take  1 capsule   Daily  to Prevent Heartburn & Indigestion   Multiple Vitamins-Minerals (MULTIVITAMIN WITH MINERALS) tablet, Take 1 tablet by mouth daily.   Omega-3 Fatty Acids (FISH OIL) 1200 MG CAPS, Take 2 capsules by mouth daily.   OVER THE COUNTER MEDICATION, Take 1 tablet by mouth daily. Allergy meds   Polyethylene Glycol 3350 (MIRALAX PO), Take 17 g by mouth as needed.  Past Medical History:  Diagnosis Date   Allergy    Anxiety    situational   Arthritis    B12 deficiency    hx of , no longer taking medication   Cataracts, bilateral    Constipation    Depression    situational   Esophageal stricture    Family history of adverse reaction to anesthesia    mom had problems waking up   Fatty liver    GERD (gastroesophageal reflux disease)    Headache    migraines    Macular hole of left eye    Retinal detachment with multiple breaks, right 07/27/2017   Trigeminal neuralgia    right side of face is numb   Unspecified vitamin D deficiency  Allergies  Allergen Reactions   Pregabalin Swelling   Diphenhydramine Nausea And Vomiting    Severe headaches  Severe headaches  Severe headaches    Nitrous Oxide Other (See Comments)    Pt has gas bubble in right eye- will cause blindness per pt  Pt has gas bubble in right eye- will cause blindness per pt  Pt has gas bubble in right eye- will cause blindness per pt    Sulfa Antibiotics Other (See Comments)    Headache    ROS: all negative except above.   Physical Exam: Filed Weights   02/10/23 1047   Weight: 182 lb 3.2 oz (82.6 kg)   BP 112/70   Pulse 70   Temp 97.9 F (36.6 C)   Ht 5' 4.75" (1.645 m)   Wt 182 lb 3.2 oz (82.6 kg)   SpO2 98%   BMI 30.55 kg/m  General Appearance: Well nourished, in no apparent distress. Eyes: PERRLA, EOMs, conjunctiva no swelling or erythema Sinuses: No Frontal/maxillary tenderness ENT/Mouth: Ext aud canals clear, TMs without erythema, bulging. No erythema, swelling, or exudate on post pharynx.  Tonsils not swollen or erythematous. Hearing normal.  Neck: Supple, thyroid normal.  Respiratory: Respiratory effort normal, BS equal bilaterally without rales, rhonchi, wheezing or stridor.  Cardio: RRR with no MRGs. Brisk peripheral pulses without edema.  Abdomen: Soft, + BS.  Non tender, no guarding, rebound, hernias, masses. Lymphatics: Non tender without lymphadenopathy.  Musculoskeletal: Full ROM, 5/5 strength, normal gait.  Skin: Warm, dry without rashes, lesions, ecchymosis.  Neuro: Cranial nerves intact. Normal muscle tone, no cerebellar symptoms. Sensation intact.  Psych: Awake and oriented X 3, normal affect, Insight and Judgment appropriate.     Adela Glimpse, NP 3:40 PM [facility]

## 2023-03-08 ENCOUNTER — Encounter: Payer: PPO | Admitting: Nurse Practitioner

## 2023-03-09 NOTE — Progress Notes (Signed)
[name] [address] Dept: [phone]  FOLLOW UP NOTE  Patient ID: Margaret Gilmore, female    DOB: [birth year]  Age: 67 y.o. MRN: [id number] Date of Office Visit: 03/10/2023  Assessment  Chief Complaint: Allergic Rhinitis  (Interested in [facility] a[facility]. Sneezing, coughing, facial pressure, headaches,runny nose. )  HPI Margaret Gilmore is a 67 year old female who presents to the clinic for follow-up visit.  She was last seen in this clinic on 931 2024 by Thermon Leyland, FNP, for evaluation of allergic rhinitis, allergic conjunctivitis, and shortness of breath.  Chart review indicates that on 02/08/2023 she visited the emergency department at [facility] for evaluation of chest pain x 2 to 3 days.  Chest x-ray normal and CTPA without PE.  She was discharged with NSAID therapy.  Discussed the use of AI scribe software for clinical note transcription with the patient, who gave verbal consent to proceed.  History of Present Illness   The patient, with a history of allergies, presents with a desire to resume allergy shots, which she had discontinued due to personal circumstances. She reports feeling 'full' in the head, experiencing headaches, sneezing, coughing, and a sensation of dryness. She also notes post-nasal drainage and difficulty blowing her nose due to previous nasal surgery. She continues levocetirizine 5 mg once a day and azelastine as needed. She is not currently using a nasal saline rinse. She has previously received allergen immunotherapy directed toward grass pollen, tree pollen, cat, dog, and ragweed with her last injection of 0.5 from the 1:100 vial on 10/23/2022. The patient is scheduled for another nasal surgery in the near future, which she hopes will be the last one needed.  The patient also reports dry, sometimes itchy eyes, which she manages with Systane eye drops. She has not been using any other prescription medications for her allergies, aside from  occasional Tylenol.  In addition to her allergy symptoms, the patient mentions a recent episode of chest pain, which led to an emergency room visit. She reports lifting a tote containing either gardening supplies or weeds earlier in the day. The pain was located in the chest area and felt like a squeezing sensation. However, tests for a potential blood clot or heart issue came back negative. The patient suspects the pain might be due to a pulled muscle from lifting heavy objects. She also notes that the pain seems to be worse when sleeping on one side. She reports no significant issues with breathing, shortness of breath, or wheezing. She has not used her albuterol in about 2 years.   Her current medications are listed in the chart.    Drug Allergies:  Allergies  Allergen Reactions   Pregabalin Swelling   Diphenhydramine Nausea And Vomiting    Severe headaches  Severe headaches  Severe headaches    Nitrous Oxide Other (See Comments)    Pt has gas bubble in right eye- will cause blindness per pt  Pt has gas bubble in right eye- will cause blindness per pt  Pt has gas bubble in right eye- will cause blindness per pt    Sulfa Antibiotics Other (See Comments)    Headache    Physical Exam: BP 116/70   Pulse 80   Temp 98.3 F (36.8 C)   Resp 16   Wt 183 lb 6 oz (83.2 kg)   SpO2 97%   BMI 30.75 kg/m    Physical Exam Vitals reviewed.  Constitutional:  Appearance: Normal appearance.  HENT:     Head: Normocephalic and atraumatic.     Right Ear: Tympanic membrane normal.     Left Ear: Tympanic membrane normal.     Nose:     Comments: Bilateral nares slightly erythematous with thin clear nasal drainage noted. Pharynx normal. Ears normal. Eyes normal.    Mouth/Throat:     Pharynx: Oropharynx is clear.  Eyes:     Conjunctiva/sclera: Conjunctivae normal.  Cardiovascular:     Rate and Rhythm: Normal rate and regular rhythm.     Heart sounds: Normal heart sounds. No murmur  heard. Pulmonary:     Effort: Pulmonary effort is normal.     Breath sounds: Normal breath sounds.     Comments: Lungs clear to auscultation Musculoskeletal:        General: Normal range of motion.     Cervical back: Normal range of motion and neck supple.  Skin:    General: Skin is warm and dry.  Neurological:     Mental Status: She is alert and oriented to person, place, and time.  Psychiatric:        Mood and Affect: Mood normal.        Behavior: Behavior normal.        Thought Content: Thought content normal.        Judgment: Judgment normal.    Assessment and Plan: 1. Seasonal and perennial allergic rhinitis   2. Seasonal allergic conjunctivitis   3. Shortness of breath     Meds ordered this encounter  Medications   EPINEPHrine 0.3 mg/0.3 mL IJ SOAJ injection    Sig: Inject 0.3 mg into the muscle as needed for anaphylaxis.    Dispense:  2 each    Refill:  1    Patient Instructions  Seasonal and perennial allergic rhinitis (skin test positive ZO:XWRUEAV, ragweed, trees, indoor molds, outdoor molds, cat, and dog) Continue levocetirizine 5 mg once a day as needed for a runny nose or itch Consider saline nasal rinses as needed for nasal symptoms. Use this before any medicated nasal sprays for best result Restart azelastine as needed for a runny nose Restart allergen immunotherapy and have access to an epinephrine autoinjector set per protocol. Written information provided for [facility]  Allergic conjunctivitis Some over the counter eye drops include Pataday one drop in each eye once a day as needed for red, itchy eyes OR Zaditor one drop in each eye twice a day as needed for red itchy eyes. Avoid eye drops that say red eye relief as they may contain medications that dry out your eyes.    Shortness of breath/cough May use albuterol using 2 puffs every 4-6 hours as needed for cough, wheeze, tightness in chest, or shortness of breath to see if this helps with your shortness of  breath and cough.  Call the clinic if this treatment plan is not working well for you  Follow up in 3 months or sooner if needed.  Thank you for the opportunity to care for this patient.  Please do not hesitate to contact me with questions.  Thermon Leyland, FNP [facility]

## 2023-03-09 NOTE — Patient Instructions (Incomplete)
Seasonal and perennial allergic rhinitis (skin test positive ZO:XWRUEAV, ragweed, trees, indoor molds, outdoor molds, cat, and dog) Continue levocetirizine 5 mg once a day as needed for a runny nose or itch Consider saline nasal rinses as needed for nasal symptoms. Use this before any medicated nasal sprays for best result Restart azelastine as needed for a runny nose Restart allergen immunotherapy and have access to an epinephrine autoinjector set per protocol. Written information provided for [facility]  Allergic conjunctivitis Some over the counter eye drops include Pataday one drop in each eye once a day as needed for red, itchy eyes OR Zaditor one drop in each eye twice a day as needed for red itchy eyes. Avoid eye drops that say red eye relief as they may contain medications that dry out your eyes.    Shortness of breath/cough May use albuterol using 2 puffs every 4-6 hours as needed for cough, wheeze, tightness in chest, or shortness of breath to see if this helps with your shortness of breath and cough.  Call the clinic if this treatment plan is not working well for you  Follow up in 3 months or sooner if needed.    Reducing Pollen Exposure  The American Academy of Allergy, Asthma and Immunology suggests the following steps to reduce your exposure to pollen during allergy seasons.    Do not hang sheets or clothing out to dry; pollen may collect on these items. Do not mow lawns or spend time around freshly cut grass; mowing stirs up pollen. Keep windows closed at night.  Keep car windows closed while driving. Minimize morning activities outdoors, a time when pollen counts are usually at their highest. Stay indoors as much as possible when pollen counts or humidity is high and on windy days when pollen tends to remain in the air longer. Use air conditioning when possible.  Many air conditioners have filters that trap the pollen spores. Use a HEPA room air filter to remove pollen form the  indoor air you breathe.  Control of Mold Allergen   Mold and fungi can grow on a variety of surfaces provided certain temperature and moisture conditions exist.  Outdoor molds grow on plants, decaying vegetation and soil.  The major outdoor mold, Alternaria and Cladosporium, are found in very high numbers during hot and dry conditions.  Generally, a late Summer - Fall peak is seen for common outdoor fungal spores.  Rain will temporarily lower outdoor mold spore count, but counts rise rapidly when the rainy period ends.  The most important indoor molds are Aspergillus and Penicillium.  Dark, humid and poorly ventilated basements are ideal sites for mold growth.  The next most common sites of mold growth are the bathroom and the kitchen.  Outdoor (Seasonal) Mold Control  Positive outdoor molds via skin testing: Bipolaris (Helminthsporium), Drechslera (Curvalaria), and Mucor  Use air conditioning and keep windows closed Avoid exposure to decaying vegetation. Avoid leaf raking. Avoid grain handling. Consider wearing a face mask if working in moldy areas.    Indoor (Perennial) Mold Control   Positive indoor molds via skin testing: Fusarium, Aureobasidium (Pullulara), and Rhizopus  Maintain humidity below 50%. Clean washable surfaces with 5% bleach solution. Remove sources e.g. contaminated carpets.    Control of Dog or Cat Allergen  Avoidance is the best way to manage a dog or cat allergy. If you have a dog or cat and are allergic to dog or cats, consider removing the dog or cat from the home. If you  have a dog or cat but don't want to find it a new home, or if your family wants a pet even though someone in the household is allergic, here are some strategies that may help keep symptoms at bay:  Keep the pet out of your bedroom and restrict it to only a few rooms. Be advised that keeping the dog or cat in only one room will not limit the allergens to that room. Don't pet, hug or kiss the  dog or cat; if you do, wash your hands with soap and water. High-efficiency particulate air (HEPA) cleaners run continuously in a bedroom or living room can reduce allergen levels over time. Regular use of a high-efficiency vacuum cleaner or a central vacuum can reduce allergen levels. Giving your dog or cat a bath at least once a week can reduce airborne allergen.

## 2023-03-10 ENCOUNTER — Other Ambulatory Visit: Payer: Self-pay

## 2023-03-10 ENCOUNTER — Ambulatory Visit: Payer: PPO | Admitting: Family Medicine

## 2023-03-10 ENCOUNTER — Encounter: Payer: Self-pay | Admitting: Family Medicine

## 2023-03-10 VITALS — BP 116/70 | HR 80 | Temp 98.3°F | Resp 16 | Wt 183.4 lb

## 2023-03-10 DIAGNOSIS — R0602 Shortness of breath: Secondary | ICD-10-CM

## 2023-03-10 DIAGNOSIS — J302 Other seasonal allergic rhinitis: Secondary | ICD-10-CM

## 2023-03-10 DIAGNOSIS — H1013 Acute atopic conjunctivitis, bilateral: Secondary | ICD-10-CM

## 2023-03-10 DIAGNOSIS — J3089 Other allergic rhinitis: Secondary | ICD-10-CM | POA: Diagnosis not present

## 2023-03-10 DIAGNOSIS — H1010 Acute atopic conjunctivitis, unspecified eye: Secondary | ICD-10-CM | POA: Insufficient documentation

## 2023-03-10 MED ORDER — EPINEPHRINE 0.3 MG/0.3ML IJ SOAJ: 0.3000 mg | INTRAMUSCULAR | 1 refills | Status: DC | PRN

## 2023-03-11 ENCOUNTER — Ambulatory Visit
Admission: RE | Admit: 2023-03-11 | Discharge: 2023-03-11 | Disposition: A | Discharge status: Home / Self Care | Payer: PPO | Source: Ambulatory Visit | Attending: Nurse Practitioner | Admitting: Nurse Practitioner

## 2023-03-11 ENCOUNTER — Encounter: Payer: PPO | Admitting: Nurse Practitioner

## 2023-03-11 DIAGNOSIS — N958 Other specified menopausal and perimenopausal disorders: Secondary | ICD-10-CM | POA: Diagnosis not present

## 2023-03-11 DIAGNOSIS — Z1231 Encounter for screening mammogram for malignant neoplasm of breast: Secondary | ICD-10-CM | POA: Diagnosis not present

## 2023-03-11 DIAGNOSIS — M8588 Other specified disorders of bone density and structure, other site: Secondary | ICD-10-CM | POA: Diagnosis not present

## 2023-03-11 DIAGNOSIS — Z13820 Encounter for screening for osteoporosis: Secondary | ICD-10-CM

## 2023-03-11 DIAGNOSIS — Z90722 Acquired absence of ovaries, bilateral: Secondary | ICD-10-CM | POA: Diagnosis not present

## 2023-03-16 ENCOUNTER — Ambulatory Visit (INDEPENDENT_AMBULATORY_CARE_PROVIDER_SITE_OTHER): Payer: PPO | Admitting: Nurse Practitioner

## 2023-03-16 ENCOUNTER — Encounter: Payer: Self-pay | Admitting: Nurse Practitioner

## 2023-03-16 VITALS — BP 112/70 | HR 72 | Temp 97.8°F | Ht 65.0 in | Wt 182.0 lb

## 2023-03-16 DIAGNOSIS — Z0000 Encounter for general adult medical examination without abnormal findings: Secondary | ICD-10-CM

## 2023-03-16 DIAGNOSIS — I10 Essential (primary) hypertension: Secondary | ICD-10-CM | POA: Diagnosis not present

## 2023-03-16 DIAGNOSIS — E782 Mixed hyperlipidemia: Secondary | ICD-10-CM

## 2023-03-16 DIAGNOSIS — Z0001 Encounter for general adult medical examination with abnormal findings: Secondary | ICD-10-CM | POA: Diagnosis not present

## 2023-03-16 DIAGNOSIS — E66811 Obesity, class 1: Secondary | ICD-10-CM

## 2023-03-16 DIAGNOSIS — E538 Deficiency of other specified B group vitamins: Secondary | ICD-10-CM | POA: Diagnosis not present

## 2023-03-16 DIAGNOSIS — M950 Acquired deformity of nose: Secondary | ICD-10-CM

## 2023-03-16 DIAGNOSIS — I4510 Unspecified right bundle-branch block: Secondary | ICD-10-CM

## 2023-03-16 DIAGNOSIS — H35341 Macular cyst, hole, or pseudohole, right eye: Secondary | ICD-10-CM

## 2023-03-16 DIAGNOSIS — E559 Vitamin D deficiency, unspecified: Secondary | ICD-10-CM

## 2023-03-16 DIAGNOSIS — Z79899 Other long term (current) drug therapy: Secondary | ICD-10-CM

## 2023-03-16 DIAGNOSIS — Z136 Encounter for screening for cardiovascular disorders: Secondary | ICD-10-CM | POA: Diagnosis not present

## 2023-03-16 DIAGNOSIS — R7309 Other abnormal glucose: Secondary | ICD-10-CM | POA: Diagnosis not present

## 2023-03-16 DIAGNOSIS — H35342 Macular cyst, hole, or pseudohole, left eye: Secondary | ICD-10-CM

## 2023-03-16 DIAGNOSIS — F3341 Major depressive disorder, recurrent, in partial remission: Secondary | ICD-10-CM

## 2023-03-16 DIAGNOSIS — Z1389 Encounter for screening for other disorder: Secondary | ICD-10-CM | POA: Diagnosis not present

## 2023-03-16 NOTE — Progress Notes (Signed)
CPE  Assessment and Plan:  CPE Due annually  Health maintenance reviewed Healthily lifestyle goals set  Hyperlipidemia Discussed lifestyle modifications. Recommended diet heavy in fruits and veggies, omega 3's. Decrease consumption of animal meats, cheeses, and dairy products. Remain active and exercise as tolerated. Continue to monitor. Check lipids/TSH  Vitamin D deficiency Continue supplement for goal of 60-100 Monitor Vitamin D levels  Hx of prediabetes Education: Reviewed 'ABCs' of diabetes management  Discussed goals to be met and/or maintained include A1C (<7) Blood pressure (<130/80) Cholesterol (LDL <70) Continue Eye Exam yearly  Continue Dental Exam Q6 mo Discussed dietary recommendations Discussed Physical Activity recommendations Check A1C  Obesity (BMI 30.0-34.9) Discussed appropriate BMI Diet modification. Physical activity. Encouraged/praised to build confidence.  Macular hole, right eye/left eye Followed by ophthalmology - declining further surgeries at this time  Medication management All medications discussed and reviewed in full. All questions and concerns regarding medications addressed.    RBBB Monitor, avoid rate controlling agents  Acquired deformity of nose Continue with [employer] Continue to monitor  Major depressive disorder, in partial remission (HCC) Controlled - continue family support Reviewed relaxation techniques.  Sleep hygiene. Recommended mindfulness meditation and exercise.   Encouraged personality growth wand development through coping techniques and problem-solving skills. Limit/Decrease/Monitor drug/alcohol intake.    B12 Recheck levels at CPE, continue supplement as indicated  Orders Placed This Encounter  Procedures   CBC with Differential/Platelet   COMPLETE METABOLIC PANEL WITH GFR   Magnesium   Lipid panel   TSH   Hemoglobin A1c   Insulin, random   VITAMIN D 25 Hydroxy (Vit-D Deficiency, Fractures)    Urinalysis, Routine w reflex microscopic   Microalbumin / creatinine urine ratio   Vitamin B12   EKG 12-Lead    Notify office for further evaluation and treatment, questions or concerns if any reported s/s fail to improve.   The patient was advised to call back or seek an in-person evaluation if any symptoms worsen or if the condition fails to improve as anticipated.   Further disposition pending results of labs. Discussed med's effects and SE's.    I discussed the assessment and treatment plan with the patient. The patient was provided an opportunity to ask questions and all were answered. The patient agreed with the plan and demonstrated an understanding of the instructions.  Discussed med's effects and SE's. Screening labs and tests as requested with regular follow-up as recommended.  I provided 40 minutes of face-to-face time during this encounter including counseling, chart review, and critical decision making was preformed.  Today's Plan of Care is based on a patient-centered health care approach known as shared decision making - the decisions, tests and treatments allow for patient preferences and values to be balanced with clinical evidence.    Future Appointments  Date Time Provider Department Center  08/02/2023  9:00 AM Adela Glimpse, NP [facility] None  03/15/2024  2:00 PM Adela Glimpse, NP [facility] None  03/17/2024 10:30 AM Alfonse Spruce, MD [facility] None     Plan:   During the course of the visit the patient was educated and counseled about appropriate screening and preventive services including:   Pneumococcal vaccine  Prevnar 13 Influenza vaccine Td vaccine Screening electrocardiogram Bone densitometry screening Colorectal cancer screening Diabetes screening Glaucoma screening Nutrition counseling  Advanced directives: requested    HPI  67 y.o. female  presents for annual CPE and follow up. She  has Depression, major, recurrent (HCC);  Vitamin D deficiency; Anxiety; Other abnormal  glucose (hx of prediabetes); Macular hole, left eye; Macular hole, right eye; Obesity (BMI 30.0-34.9); Right bundle branch block (RBBB); Chronic rhinitis; Nasal deformity, acquired; Hyperlipidemia; Non-restorative sleep; Uncontrolled morning headache; Acquired facial deformity; GERD with stricture; Depression; Excessive daytime sleepiness; Seasonal and perennial allergic rhinitis; Seasonal allergic conjunctivitis; and Shortness of breath on their problem list.   Overall she reports feeling well today.  She has no new complaints.   She is continuing to take care of her mother.  Recently had a new puppy and is enjoying.  She is single. No longer working d/t multiple eye procedures (macular holes, surgery x 3, declining further), no longer driving, daughter or mom drives her if needed. Avoids going out at night or high risk fall situations. Denies falls or near falls this year.   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 Christia Reading, was referred to cosmetic surgeon and allergist. Had last f/u with Allergy 03/10/23. Recently completed reconstructive surgery.  Dr. Chestine Spore at [facility]. [facility], Dr. Bing Plume II originally estb on 6/02022 with f/u on 06/08/22 for nonhealing ulcerative lesion on the right alar remnant, right nasal sidewall, right cheek and right upper lip.  Dated back to 1999 when she had surgery by Dr Ruffin Pyo for trigeminal neuralgia and from her description sounds like a Jannetta procedure.  She has seen multiple MD's over the years.  She did admit to picking at the wound.  Is not able to control.  She reports she has not seen a psychiatrist or therapist about the issue.  She returned on 05/2022 for a post op visitit after a flap delay prcedure on 06/02/2022 and is doing very well.  She is scheduled for another  surgery on 06/25/22.  Has some anxiety and depression but feels it is well controlled with good family support. Did not do well with lexapro or wellbutrin, feels better off of med, doing therapy.   Has history of GERD and has had EGD/dilitation in 2015 and again in 2019. She is taking nexium daily.   BMI is Body mass index is 30.29 kg/m., she has been working on diet and exercise. Wt Readings from Last 3 Encounters:  03/16/23 182 lb (82.6 kg)  03/10/23 183 lb 6 oz (83.2 kg)  02/10/23 182 lb 3.2 oz (82.6 kg)   Today their BP is BP: 112/70 She does workout. She denies chest pain, shortness of breath, dizziness.   She is not on cholesterol medication and denies myalgias. Her cholesterol is not at goal, higher than usual last visit. The cholesterol last visit was:   Lab Results  Component Value Date   CHOL 185 12/10/2022   HDL 58 12/10/2022   LDLCALC 99 12/10/2022   TRIG 179 (H) 12/10/2022   CHOLHDL 3.2 12/10/2022   She has been working on diet and exercise for hx of prediabetes, she is not on bASA, she is not on ACE/ARB and denies foot ulcerations, increased appetite, nausea, paresthesia of the feet, polydipsia, polyuria, vomiting and weight loss. Last A1C in the office was:  Lab Results  Component Value Date   HGBA1C 5.6 08/19/2022   Last GFR: Lab Results  Component Value Date   GFRNONAA >60 02/08/2023   Patient is on Vitamin D supplement, taking 2000 IU daily   Lab Results  Component Value Date   VD25OH 67 12/10/2022     She takes chewable supplement  daily, feels helps her energy, has done shots in the past Lab Results  Component Value Date   VITAMINB12 1,957 (H) 12/10/2022     Current Medications:  Current Outpatient Medications on File Prior to Visit  Medication Sig Dispense Refill   Acetaminophen (TYLENOL PO) Take by mouth.     Ascorbic Acid (VITAMIN C PO) Take 1 tablet by mouth daily.     ASHWAGANDHA PO Take by mouth.     azelastine (ASTELIN) 0.1 % nasal spray  Apply 1 spray each nostril twice a day AS NEEDED for runny nose/drainage down throat. 30 mL 5   Cholecalciferol (VITAMIN D3) 25 MCG (1000 UT) CAPS Take 1 capsule by mouth daily.     EPINEPHrine 0.3 mg/0.3 mL IJ SOAJ injection Inject 0.3 mg into the muscle as needed for anaphylaxis. 2 each 1   esomeprazole (NEXIUM) 40 MG capsule Take  1 capsule   Daily  to Prevent Heartburn & Indigestion 90 capsule 3   levocetirizine (XYZAL) 5 MG tablet Take 1 tablet (5 mg total) by mouth daily as needed for allergies (Can take an extra dose during flare ups.). 60 tablet 5   Multiple Vitamins-Minerals (MULTIVITAMIN WITH MINERALS) tablet Take 1 tablet by mouth daily.     Omega-3 Fatty Acids (FISH OIL) 1200 MG CAPS Take 2 capsules by mouth daily.     vitamin B-12 (CYANOCOBALAMIN) 500 MCG tablet Take 500 mcg by mouth daily.     OVER THE COUNTER MEDICATION Take 1 tablet by mouth daily. Allergy meds     Polyethylene Glycol 3350 (MIRALAX PO) Take 17 g by mouth as needed. (Patient not taking: Reported on 03/10/2023)     Rimegepant Sulfate (NURTEC) 75 MG TBDP Take 1 tab for migraine; can repeat in 2 hours if needed. (Patient not taking: Reported on 03/10/2023) 2 tablet 0   No current facility-administered medications on file prior to visit.   Allergies:  Allergies  Allergen Reactions   Pregabalin Swelling   Diphenhydramine Nausea And Vomiting    Severe headaches  Severe headaches  Severe headaches    Nitrous Oxide Other (See Comments)    Pt has gas bubble in right eye- will cause blindness per pt  Pt has gas bubble in right eye- will cause blindness per pt  Pt has gas bubble in right eye- will cause blindness per pt    Sulfa Antibiotics Other (See Comments)    Headache   Medical History:  She has Depression, major, recurrent (HCC); Vitamin D deficiency; Anxiety; Other abnormal glucose (hx of prediabetes); Macular hole, left eye; Macular hole, right eye; Obesity (BMI 30.0-34.9); Right bundle branch block (RBBB);  Chronic rhinitis; Nasal deformity, acquired; Hyperlipidemia; Non-restorative sleep; Uncontrolled morning headache; Acquired facial deformity; GERD with stricture; Depression; Excessive daytime sleepiness; Seasonal and perennial allergic rhinitis; Seasonal allergic conjunctivitis; and Shortness of breath on their problem list. Health Maintenance:   Immunization History  Administered Date(s) Administered   Influenza, High Dose Seasonal PF 06/26/2021   Influenza-Unspecified 02/15/2018, 02/16/2020   PPD Test 05/31/2013   Pneumococcal Polysaccharide-23 07/10/2020   Tdap 05/31/2013   Health Maintenance  Topic Date Due   COVID-19 Vaccine (1) Never done   Zoster Vaccines- Shingrix (1 of 2) Never done   Pneumonia Vaccine 3+ Years old (2 of 2 - PCV) 07/10/2021   INFLUENZA VACCINE  12/17/2022   DTaP/Tdap/Td (2 - Td or Tdap) 06/01/2023   Medicare Annual Wellness (AWV)  08/19/2023   Colonoscopy  08/26/2023   MAMMOGRAM  03/10/2025  DEXA SCAN  Completed   Hepatitis C Screening  Completed   HPV VACCINES  Aged Out   Shingrix: check with insurance  Covid 19: declines  Pap: Remote, never abnormal, s/p TAH, declines further unless problems MGM: 02/2023 gets annually at breast center DEXA: 02/2023 Osteopenic T Score -2.6 - Defers alendronate/fosamax and prolia.     Colonoscopy: 2015, Dr. Russella Dar, 10 year recall  EGD: 2019  Last Dental Exam: Dr. Juanetta Gosling in [place], last visit 2024, encouraged to schedule Last Eye Exam: Dr. Alan Mulder, [facility], last visit 2024, goes q55m  Patient Care Team: Lucky Cowboy, MD as PCP - General (Internal Medicine)  Surgical History:  She has a past surgical history that includes Abdominal hysterectomy; Cesarean section; trigeminal ; Appendectomy; knot removed from right side of abdomen; Colonoscopy; 25 gauge pars plana vitrectomy with 20 gauge mvr port for macular hole (Left, 03/05/2015); Membrane peel (Left, 03/05/2015); Serum patch  (Left, 03/05/2015); Laser photo ablation (Left, 03/05/2015); Gas/fluid exchange (Left, 03/05/2015); 25 gauge pars plana vitrectomy with 20 gauge mvr port for macular hole (Right, 06/22/2017); 25 gauge pars plana vitrectomy with 20 gauge mvr port for macular hole (Right, 06/22/2017); Gas/fluid exchange (Right, 06/22/2017); Laser photo ablation (Right, 06/22/2017); Serum patch (Right, 06/22/2017); Membrane peel (Right, 06/22/2017); Scleral buckle with possible 25 gauge pars plana vitrectomy (Right, 07/27/2017); Laser photo ablation (Right, 07/27/2017); Gas/fluid exchange (Right, 07/27/2017); Membrane peel (Right, 07/27/2017); 25 gauge pars plana vitrectomy with 20 gauge mvr port (Left, 11/09/2017); Esophagogastroduodenoscopy (egd) with esophageal dilation; Eye surgery; 25 gauge pars plana vitrectomy with 20 gauge mvr port (Left, 11/09/2017); Membrane peel (Left, 11/09/2017); Gas/fluid exchange (Left, 11/09/2017); Serum patch (Left, 11/09/2017); and Laser photo ablation (Left, 11/09/2017). Family History:  Herfamily history includes Alzheimer's disease in her maternal aunt and maternal grandmother; Bone cancer in her maternal grandfather; Breast cancer in her maternal aunt; COPD in her mother; Cancer in her paternal uncle; Colon cancer in her paternal uncle; Diabetes in her mother; Heart disease in her father; Hodgkin's lymphoma in her paternal aunt; Hyperlipidemia in her father and mother; Hypertension in her brother and father; Pancreatic cancer (age of onset: 73) in her father; Prostate cancer in her maternal grandfather. Social History:  She reports that she has never smoked. She has been exposed to tobacco smoke. She has never used smokeless tobacco. She reports that she does not drink alcohol and does not use drugs.  Review of Systems: Review of Systems  Constitutional:  Negative for malaise/fatigue and weight loss.  HENT:  Negative for hearing loss and tinnitus.        Chronic rhinitis  Eyes:  Positive for blurred  vision (chronic). Negative for double vision.  Respiratory:  Negative for cough, sputum production, shortness of breath and wheezing.   Cardiovascular:  Negative for chest pain, palpitations, orthopnea, claudication, leg swelling and PND.  Gastrointestinal:  Negative for abdominal pain, blood in stool, constipation, diarrhea, heartburn, melena, nausea and vomiting.  Genitourinary: Negative.   Musculoskeletal:  Negative for falls, joint pain and myalgias.  Skin:  Negative for rash.  Neurological:  Negative for dizziness, tingling, sensory change, weakness and headaches.  Endo/Heme/Allergies:  Negative for environmental allergies and polydipsia.  Psychiatric/Behavioral:  Positive for depression. Negative for memory loss, substance abuse and suicidal ideas. The patient is not nervous/anxious and does not have insomnia.   All other systems reviewed and are negative.   Physical Exam: Estimated body mass index is 30.29 kg/m as calculated from the following:  Height as of this encounter: 5\' 5"  (1.651 m).   Weight as of this encounter: 182 lb (82.6 kg). BP 112/70   Pulse 72   Temp 97.8 F (36.6 C)   Ht 5\' 5"  (1.651 m)   Wt 182 lb (82.6 kg)   SpO2 99%   BMI 30.29 kg/m    General Appearance: Well nourished, well dressed elder female, in no apparent distress.  Eyes: R pupil minimally reactive, left round and reactive, EOMs, conjunctiva no swelling or erythema Sinuses: No Frontal/maxillary tenderness  ENT/Mouth: Ext aud canals clear, normal light reflex with TMs without erythema, bulging. Good dentition. No erythema, swelling, or exudate on post pharynx. Tonsils not swollen or erythematous. Hearing normal. R nare with well healing skin graft.  No no open wounds or ulcerations; clear crusty discharge. Surrounding skin WNL. Neck: Supple, thyroid normal. No bruits  Respiratory: Respiratory effort normal, BS equal bilaterally without rales, rhonchi, wheezing or stridor.  Cardio: RRR without  murmurs, rubs or gallops. Brisk peripheral pulses without edema.  Abdomen: Soft, nontender, no guarding, rebound, hernias, masses, or organomegaly.  Lymphatics: Non tender without lymphadenopathy.  Musculoskeletal: Full ROM all peripheral extremities,5/5 strength, and normal gait.  Skin: Warm, dry without rashes, lesions, ecchymosis. 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 Normal muscle tone, no cerebellar symptoms. Sensation intact.  Psych: Awake and oriented X 3, normal affect, Insight and Judgment appropriate.   EKG:  No changes WNL    Tanaja Ganger, NP-C 2:52 PM [facility]

## 2023-03-16 NOTE — Patient Instructions (Signed)
Calcium Content in Foods Calcium is the most abundant mineral in the body. Most of the body's calcium supply is stored in bones and teeth. Calcium helps many parts of the body function normally, including: Blood and blood vessels. Nerves. Hormones. Muscles. Bones and teeth. When your calcium stores are low, you may be at risk for low bone mass, bone loss, and broken bones (fractures). When you get enough calcium, it helps to support strong bones and teeth throughout your life. Calcium is especially important for: Children during growth spurts. Girls during adolescence. Women who are pregnant or breastfeeding. Women after their menstrual cycle stops (postmenopause). Women whose menstrual cycle has stopped due to anorexia nervosa or regular intense exercise. People who cannot eat or digest dairy products. Vegans. Recommended daily amounts of calcium: Women (ages 63 to 23): 1,000 mg per day. Women (ages 53 and older): 1,200 mg per day. Men (ages 58 to 55): 1,000 mg per day. Men (ages 48 and older): 1,200 mg per day. Women (ages 58 to 22): 1,300 mg per day. Men (ages 60 to 51): 1,300 mg per day. General information Eat foods that are high in calcium. Try to get most of your calcium from food. Some people may benefit from taking calcium supplements. Check with your health care provider or diet and nutrition specialist (dietitian) before starting any calcium supplements. Calcium supplements may interact with certain medicines. Too much calcium may cause other health problems, such as constipation and kidney stones. For the body to absorb calcium, it needs vitamin D. Sources of vitamin D include: Skin exposure to direct sunlight. Foods, such as egg yolks, liver, mushrooms, saltwater fish, and fortified milk. Vitamin D supplements. Check with your health care provider or dietitian before starting any vitamin D supplements. What foods are high in calcium?  Foods that are high in calcium contain  more than 100 milligrams per serving. Fruits Fortified orange juice or other fruit juice, 300 mg per 8 oz serving. Vegetables Collard greens, 360 mg per 8 oz serving. Kale, 100 mg per 8 oz serving. Bok choy, 160 mg per 8 oz serving. Grains Fortified ready-to-eat cereals, 100 to 1,000 mg per 8 oz serving. Fortified frozen waffles, 200 mg in 2 waffles. Oatmeal, 140 mg in 1 cup. Meats and other proteins Sardines, canned with bones, 325 mg per 3 oz serving. Salmon, canned with bones, 180 mg per 3 oz serving. Canned shrimp, 125 mg per 3 oz serving. Baked beans, 160 mg per 4 oz serving. Tofu, firm, made with calcium sulfate, 253 mg per 4 oz serving. Dairy Yogurt, plain, low-fat, 310 mg per 6 oz serving. Nonfat milk, 300 mg per 8 oz serving. American cheese, 195 mg per 1 oz serving. Cheddar cheese, 205 mg per 1 oz serving. Cottage cheese 2%, 105 mg per 4 oz serving. Fortified soy, rice, or almond milk, 300 mg per 8 oz serving. Mozzarella, part skim, 210 mg per 1 oz serving. The items listed above may not be a complete list of foods high in calcium. Actual amounts of calcium may be different depending on processing. Contact a dietitian for more information. What foods are lower in calcium? Foods that are lower in calcium contain 50 mg or less per serving. Fruits Apple, about 6 mg. Banana, about 12 mg. Vegetables Lettuce, 19 mg per 2 oz serving. Tomato, about 11 mg. Grains Rice, 4 mg per 6 oz serving. Boiled potatoes, 14 mg per 8 oz serving. White bread, 6 mg per slice. Meats and other proteins  Egg, 27 mg per 2 oz serving. Red meat, 7 mg per 4 oz serving. Chicken, 17 mg per 4 oz serving. Fish, cod, or trout, 20 mg per 4 oz serving. Dairy Cream cheese, regular, 14 mg per 1 Tbsp serving. Brie cheese, 50 mg per 1 oz serving. Parmesan cheese, 70 mg per 1 Tbsp serving. The items listed above may not be a complete list of foods lower in calcium. Actual amounts of calcium may be  different depending on processing. Contact a dietitian for more information. Summary Calcium is an important mineral in the body because it affects many functions. Getting enough calcium helps support strong bones and teeth throughout your life. Try to get most of your calcium from food. Calcium supplements may interact with certain medicines. Check with your health care provider or dietitian before starting any calcium supplements. This information is not intended to replace advice given to you by your health care provider. Make sure you discuss any questions you have with your health care provider. Document Revised: 08/30/2019 Document Reviewed: 08/30/2019 Elsevier Patient Education  2024 [employer].

## 2023-03-17 LAB — MICROALBUMIN / CREATININE URINE RATIO
Creatinine, Urine: 51 mg/dL (ref 20–275)
Microalb, Ur: <0.2 mg/dL

## 2023-03-17 LAB — HEMOGLOBIN A1C
Hgb A1c MFr Bld: 5.8 %{Hb} — ABNORMAL HIGH (ref <5.7)
Mean Plasma Glucose: 120 mg/dL
eAG (mmol/L): 6.6 mmol/L

## 2023-03-17 LAB — COMPLETE METABOLIC PANEL WITH GFR
AG Ratio: 1.4 (calc) (ref 1.0–2.5)
ALT: 13 U/L (ref 6–29)
AST: 14 U/L (ref 10–35)
Albumin: 4.2 g/dL (ref 3.6–5.1)
Alkaline phosphatase (APISO): 67 U/L (ref 37–153)
BUN: 20 mg/dL (ref 7–25)
CO2: 28 mmol/L (ref 20–32)
Calcium: 9.6 mg/dL (ref 8.6–10.4)
Chloride: 105 mmol/L (ref 98–110)
Creat: 0.69 mg/dL (ref 0.50–1.05)
Globulin: 2.9 g/dL (ref 1.9–3.7)
Glucose, Bld: 85 mg/dL (ref 65–99)
Potassium: 4.2 mmol/L (ref 3.5–5.3)
Sodium: 139 mmol/L (ref 135–146)
Total Bilirubin: 0.3 mg/dL (ref 0.2–1.2)
Total Protein: 7.1 g/dL (ref 6.1–8.1)
eGFR: 94 mL/min/{1.73_m2} (ref >=60)

## 2023-03-17 LAB — CBC WITH DIFFERENTIAL/PLATELET
Absolute Lymphocytes: 2918 {cells}/uL (ref 850–3900)
Absolute Monocytes: 405 {cells}/uL (ref 200–950)
Basophils Absolute: 38 {cells}/uL (ref 0–200)
Basophils Relative: 0.5 %
Eosinophils Absolute: 120 {cells}/uL (ref 15–500)
Eosinophils Relative: 1.6 %
HCT: 38.2 % (ref 35.0–45.0)
Hemoglobin: 12.5 g/dL (ref 11.7–15.5)
MCH: 28.9 pg (ref 27.0–33.0)
MCHC: 32.7 g/dL (ref 32.0–36.0)
MCV: 88.4 fL (ref 80.0–100.0)
MPV: 10 fL (ref 7.5–12.5)
Monocytes Relative: 5.4 %
Neutro Abs: 4020 {cells}/uL (ref 1500–7800)
Neutrophils Relative %: 53.6 %
Platelets: 327 10*3/uL (ref 140–400)
RBC: 4.32 10*6/uL (ref 3.80–5.10)
RDW: 12.7 % (ref 11.0–15.0)
Total Lymphocyte: 38.9 %
WBC: 7.5 10*3/uL (ref 3.8–10.8)

## 2023-03-17 LAB — URINALYSIS, ROUTINE W REFLEX MICROSCOPIC
Bilirubin Urine: NEGATIVE
Glucose, UA: NEGATIVE
Hgb urine dipstick: NEGATIVE
Ketones, ur: NEGATIVE
Leukocytes,Ua: NEGATIVE
Nitrite: NEGATIVE
Protein, ur: NEGATIVE
Specific Gravity, Urine: 1.011 (ref 1.001–1.035)
pH: 5.5 (ref 5.0–8.0)

## 2023-03-17 LAB — LIPID PANEL
Cholesterol: 200 mg/dL — ABNORMAL HIGH (ref <200)
HDL: 64 mg/dL (ref >=50)
LDL Cholesterol (Calc): 106 mg/dL — ABNORMAL HIGH
Non-HDL Cholesterol (Calc): 136 mg/dL — ABNORMAL HIGH (ref <130)
Total CHOL/HDL Ratio: 3.1 (calc) (ref <5.0)
Triglycerides: 182 mg/dL — ABNORMAL HIGH (ref <150)

## 2023-03-17 LAB — VITAMIN B12: Vitamin B-12: 1477 pg/mL — ABNORMAL HIGH (ref 200–1100)

## 2023-03-17 LAB — TSH: TSH: 0.91 m[IU]/L (ref 0.40–4.50)

## 2023-03-17 LAB — VITAMIN D 25 HYDROXY (VIT D DEFICIENCY, FRACTURES): Vit D, 25-Hydroxy: 59 ng/mL (ref 30–100)

## 2023-03-17 LAB — MAGNESIUM: Magnesium: 2.1 mg/dL (ref 1.5–2.5)

## 2023-03-17 LAB — INSULIN, RANDOM: Insulin: 5.7 u[IU]/mL

## 2023-03-18 ENCOUNTER — Encounter (INDEPENDENT_AMBULATORY_CARE_PROVIDER_SITE_OTHER): Payer: PPO | Admitting: Ophthalmology

## 2023-03-25 DIAGNOSIS — K219 Gastro-esophageal reflux disease without esophagitis: Secondary | ICD-10-CM | POA: Diagnosis not present

## 2023-03-25 DIAGNOSIS — M952 Other acquired deformity of head: Secondary | ICD-10-CM | POA: Diagnosis not present

## 2023-03-25 DIAGNOSIS — Z882 Allergy status to sulfonamides status: Secondary | ICD-10-CM | POA: Diagnosis not present

## 2023-03-25 DIAGNOSIS — S0993XS Unspecified injury of face, sequela: Secondary | ICD-10-CM | POA: Diagnosis not present

## 2023-03-25 DIAGNOSIS — M950 Acquired deformity of nose: Secondary | ICD-10-CM | POA: Diagnosis not present

## 2023-03-25 DIAGNOSIS — H9190 Unspecified hearing loss, unspecified ear: Secondary | ICD-10-CM | POA: Diagnosis not present

## 2023-03-25 DIAGNOSIS — Z428 Encounter for other plastic and reconstructive surgery following medical procedure or healed injury: Secondary | ICD-10-CM | POA: Diagnosis not present

## 2023-04-07 NOTE — Progress Notes (Unsigned)
Assessment and Plan:  Tulani was seen today for acute visit.  Diagnoses and all orders for this visit:  Encounter for screening for COVID-19 -     POC COVID-19- negative  Acquired deformity of nose Continue to follow with otolaryngology at [facility]  Acute URI Push fluids Mucinex as needed Z pack as directed.  Promethazine DM and Tessalon perles as needed If no improvement by Monday notify the office -     azithromycin (ZITHROMAX) 250 MG tablet; Take 2 tablets (500 mg) on  Day 1,  followed by 1 tablet (250 mg) once daily on Days 2 through 5. -     promethazine-dextromethorphan (PROMETHAZINE-DM) 6.25-15 MG/5ML syrup; Take 5 mLs by mouth 4 (four) times daily as needed for cough. -     benzonatate (TESSALON PERLES) 100 MG capsule; Take 1 capsule (100 mg total) by mouth every 6 (six) hours as needed for cough.      Further disposition pending results of labs. Discussed med's effects and SE's.   Over 30 minutes of exam, counseling, chart review, and critical decision making was performed.   Future Appointments  Date Time Provider Department Center  04/12/2023 10:30 AM [facility] NURSE [facility] None  08/02/2023  9:00 AM Adela Glimpse, NP [facility] None  03/15/2024  2:00 PM Adela Glimpse, NP [facility] None  03/17/2024 10:30 AM Alfonse Spruce, MD [facility] None    ------------------------------------------------------------------------------------------------------------------   HPI BP 120/62   Pulse 68   Temp 97.7 F (36.5 C)   Ht 5\' 5"  (1.651 m)   Wt 185 lb 6.4 oz (84.1 kg)   SpO2 97%   BMI 30.85 kg/m   67 y.o.female presents for runny nose and productive cough of yellow mucus.  Associated fatigue and headache.  Denies nausea, vomiting, diarrhea, fever and body aches.  Symptoms have been present x 4 days. She has used Nyquil and helped her sleep but symptoms persist, also using humidifier. Covid is negative today.    BP well controlled without  medication BP Readings from Last 3 Encounters:  04/08/23 120/62  03/16/23 112/70  03/10/23 116/70  Denies headaches, chest pain, shortness of breath and dizziness   BMI is Body mass index is 30.85 kg/m., she has been working on diet and exercise. Wt Readings from Last 3 Encounters:  04/08/23 185 lb 6.4 oz (84.1 kg)  03/16/23 182 lb (82.6 kg)  03/10/23 183 lb 6 oz (83.2 kg)   Acquired facial deformity , she has been undergoing multiple surgical procedures with Dr. Chestine Spore with most recent procedure 03/25/23 ADJACENT TISSUE TRANSFER/REARRANGEMENT, EYELIDS, NOSE, EARS, AND/OR LIPS; DEFECT 10 SQ CM OR LESS .  Follows at [facility], last post op check 04/06/23- healing well.   Past Medical History:  Diagnosis Date   Allergy    Anxiety    situational   Arthritis    B12 deficiency    hx of , no longer taking medication   Cataracts, bilateral    Constipation    Depression    situational   Esophageal stricture    Family history of adverse reaction to anesthesia    mom had problems waking up   Fatty liver    GERD (gastroesophageal reflux disease)    Headache    migraines    Macular hole of left eye    Retinal detachment with multiple breaks, right 07/27/2017   Trigeminal neuralgia    right side of face is numb   Unspecified vitamin D deficiency      Allergies  Allergen Reactions   Pregabalin Swelling   Diphenhydramine Nausea And Vomiting    Severe headaches  Severe headaches  Severe headaches    Nitrous Oxide Other (See Comments)    Pt has gas bubble in right eye- will cause blindness per pt  Pt has gas bubble in right eye- will cause blindness per pt  Pt has gas bubble in right eye- will cause blindness per pt    Sulfa Antibiotics Other (See Comments)    Headache    Current Outpatient Medications on File Prior to Visit  Medication Sig   Acetaminophen (TYLENOL PO) Take by mouth.   Ascorbic Acid (VITAMIN C PO) Take 1 tablet by mouth daily.   ASHWAGANDHA PO Take by mouth.    azelastine (ASTELIN) 0.1 % nasal spray Apply 1 spray each nostril twice a day AS NEEDED for runny nose/drainage down throat.   Cholecalciferol (VITAMIN D3) 25 MCG (1000 UT) CAPS Take 1 capsule by mouth daily.   EPINEPHrine 0.3 mg/0.3 mL IJ SOAJ injection Inject 0.3 mg into the muscle as needed for anaphylaxis.   esomeprazole (NEXIUM) 40 MG capsule Take  1 capsule   Daily  to Prevent Heartburn & Indigestion   levocetirizine (XYZAL) 5 MG tablet Take 1 tablet (5 mg total) by mouth daily as needed for allergies (Can take an extra dose during flare ups.).   Multiple Vitamins-Minerals (MULTIVITAMIN WITH MINERALS) tablet Take 1 tablet by mouth daily.   Omega-3 Fatty Acids (FISH OIL) 1200 MG CAPS Take 2 capsules by mouth daily.   vitamin B-12 (CYANOCOBALAMIN) 500 MCG tablet Take 500 mcg by mouth daily.   OVER THE COUNTER MEDICATION Take 1 tablet by mouth daily. Allergy meds (Patient not taking: Reported on 04/08/2023)   Polyethylene Glycol 3350 (MIRALAX PO) Take 17 g by mouth as needed. (Patient not taking: Reported on 03/10/2023)   Rimegepant Sulfate (NURTEC) 75 MG TBDP Take 1 tab for migraine; can repeat in 2 hours if needed. (Patient not taking: Reported on 03/10/2023)   No current facility-administered medications on file prior to visit.    ROS: all negative except above.   Physical Exam:  BP 120/62   Pulse 68   Temp 97.7 F (36.5 C)   Ht 5\' 5"  (1.651 m)   Wt 185 lb 6.4 oz (84.1 kg)   SpO2 97%   BMI 30.85 kg/m   General Appearance: Well nourished, in no apparent distress. Eyes: PERRLA, EOMs, conjunctiva no swelling or erythema Sinuses: Positive maxillary tenderness ENT/Mouth: Ext aud canals clear, TMs without erythema, bulging. No erythema, swelling, or exudate on post pharynx.   Hearing normal.  Neck: Supple, thyroid normal.  Respiratory: Respiratory effort normal, BS equal bilaterally without rales, rhonchi, wheezing or stridor.  Cardio: RRR with no MRGs. Brisk peripheral pulses  without edema.  Abdomen: Soft, + BS.  Non tender, no guarding, rebound, hernias, masses. Lymphatics: Non tender without lymphadenopathy.  Musculoskeletal: Full ROM, 5/5 strength, normal gait.  Skin: Warm, dry . Skin graft is healing well on right nostril Neuro: Cranial nerves intact. Normal muscle tone, no cerebellar symptoms. Sensation intact.  Psych: Awake and oriented X 3, normal affect, Insight and Judgment appropriate.     Raynelle Dick, NP 2:13 PM [facility]

## 2023-04-08 ENCOUNTER — Other Ambulatory Visit: Payer: Self-pay

## 2023-04-08 ENCOUNTER — Encounter: Payer: Self-pay | Admitting: Nurse Practitioner

## 2023-04-08 ENCOUNTER — Encounter: Payer: Self-pay | Admitting: Internal Medicine

## 2023-04-08 ENCOUNTER — Ambulatory Visit (INDEPENDENT_AMBULATORY_CARE_PROVIDER_SITE_OTHER): Payer: PPO | Admitting: Nurse Practitioner

## 2023-04-08 VITALS — BP 120/62 | HR 68 | Temp 97.7°F | Ht 65.0 in | Wt 185.4 lb

## 2023-04-08 DIAGNOSIS — Z1152 Encounter for screening for COVID-19: Secondary | ICD-10-CM

## 2023-04-08 DIAGNOSIS — M950 Acquired deformity of nose: Secondary | ICD-10-CM

## 2023-04-08 DIAGNOSIS — J069 Acute upper respiratory infection, unspecified: Secondary | ICD-10-CM | POA: Diagnosis not present

## 2023-04-08 LAB — POC COVID19 BINAXNOW: SARS Coronavirus 2 Ag: NEGATIVE

## 2023-04-08 MED ORDER — PROMETHAZINE-DM 6.25-15 MG/5ML PO SYRP
5.0000 mL | ORAL_SOLUTION | Freq: Four times a day (QID) | ORAL | 1 refills | Status: AC | PRN
Start: 2023-04-08 — End: ?

## 2023-04-08 MED ORDER — AZITHROMYCIN 250 MG PO TABS
ORAL_TABLET | ORAL | 1 refills | Status: DC
Start: 2023-04-08 — End: 2023-05-18

## 2023-04-08 MED ORDER — BENZONATATE 100 MG PO CAPS
100.0000 mg | ORAL_CAPSULE | Freq: Four times a day (QID) | ORAL | 1 refills | Status: AC | PRN
Start: 2023-04-08 — End: 2024-04-07

## 2023-04-08 NOTE — Patient Instructions (Signed)

## 2023-04-12 ENCOUNTER — Ambulatory Visit (INDEPENDENT_AMBULATORY_CARE_PROVIDER_SITE_OTHER): Payer: PPO

## 2023-04-12 DIAGNOSIS — J309 Allergic rhinitis, unspecified: Secondary | ICD-10-CM

## 2023-04-21 ENCOUNTER — Ambulatory Visit (INDEPENDENT_AMBULATORY_CARE_PROVIDER_SITE_OTHER): Payer: Self-pay

## 2023-04-21 DIAGNOSIS — J309 Allergic rhinitis, unspecified: Secondary | ICD-10-CM | POA: Diagnosis not present

## 2023-04-28 ENCOUNTER — Ambulatory Visit (INDEPENDENT_AMBULATORY_CARE_PROVIDER_SITE_OTHER): Payer: PPO

## 2023-04-28 DIAGNOSIS — J309 Allergic rhinitis, unspecified: Secondary | ICD-10-CM

## 2023-05-05 ENCOUNTER — Ambulatory Visit (INDEPENDENT_AMBULATORY_CARE_PROVIDER_SITE_OTHER): Payer: Self-pay

## 2023-05-05 DIAGNOSIS — J309 Allergic rhinitis, unspecified: Secondary | ICD-10-CM

## 2023-05-17 NOTE — Progress Notes (Signed)
 Assessment and Plan:  Margaret Gilmore was seen today for an episodic visit.  Diagnoses and all order for this visit:  Caregiver stress (Primary) Insight-oriented psychotherapy given for 20 minutes exclusively.  Anxiety/Depression Start Zoloft  25 mg  Reviewed relaxation techniques.  Sleep hygiene. Encouraged personality growth wand development through coping techniques and problem-solving skills. Limit/Decrease/Monitor drug/alcohol intake.    Medication management All medications discussed and reviewed in full. All questions and concerns regarding medications addressed.    Meds ordered this encounter  Medications   sertraline  (ZOLOFT ) 25 MG tablet    Sig: Take 1 tablet (25 mg total) by mouth daily.    Dispense:  30 tablet    Refill:  0    Patient request to have for pick up by 3:30 pm if possible.    Supervising Provider:   TONITA FALLOW [phone]   Notify office for further evaluation and treatment, questions or concerns if s/s fail to improve. The risks and benefits of my recommendations, as well as other treatment options were discussed with the patient today. Questions were answered.  Further disposition pending results of labs. Discussed med's effects and SE's.    Over 20 minutes of exam, counseling, chart review, and critical decision making was performed.   Future Appointments  Date Time Provider Department Center  08/02/2023  9:00 AM Laurice President, NP [facility] None  03/15/2024  2:00 PM Laurice President, NP [facility] None  03/17/2024 10:30 AM Iva Marty Saltness, MD [facility] None    ------------------------------------------------------------------------------------------------------------------   HPI BP 136/78   Pulse 73   Temp 98 F (36.7 C)   Ht 5' 5 (1.651 m)   Wt 191 lb (86.6 kg)   SpO2 98%   BMI 31.78 kg/m   67 y.o.female presents for evaluation of  medication management for increase in stress, irritability, anxiety and depression.  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  States she does not have a therapeutic relationship with mother.  Patient wishes to refrain from becoming irritable and crying.  She has taken Wellbutrin  and Lexapro  in the past with SE and wishes to try something new.  She denies HI/SI, extreme mood swings.   Past Medical History:  Diagnosis Date   Allergy    Anxiety    situational   Arthritis    B12 deficiency    hx of , no longer taking medication   Cataracts, bilateral    Constipation    Depression    situational   Esophageal stricture    Family history of adverse reaction to anesthesia    mom had problems waking up   Fatty liver    GERD (gastroesophageal reflux disease)    Headache    migraines    Macular hole of left eye    Retinal detachment with multiple breaks, right 07/27/2017   Trigeminal neuralgia    right side of face is numb   Unspecified vitamin D  deficiency      Allergies  Allergen Reactions   Pregabalin Swelling   Diphenhydramine Nausea And Vomiting    Severe headaches  Severe headaches  Severe headaches    Nitrous Oxide Other (See Comments)    Pt has gas bubble in right eye- will cause blindness per pt  Pt has gas bubble in right eye- will cause blindness per pt  Pt has gas bubble in right eye- will cause blindness per pt    Sulfa Antibiotics Other (See Comments)  Headache    Current Outpatient Medications on File Prior to Visit  Medication Sig   Acetaminophen  (TYLENOL  PO) Take by mouth.   Ascorbic Acid (VITAMIN C PO) Take 1 tablet by mouth daily.   ASHWAGANDHA PO Take by mouth.   azelastine  (ASTELIN ) 0.1 % nasal spray Apply 1 spray each nostril twice a day AS NEEDED for runny nose/drainage down throat.   benzonatate  (TESSALON  PERLES) 100 MG capsule Take 1 capsule (100 mg total) by mouth every 6 (six) hours as needed for cough.   Cholecalciferol (VITAMIN D3) 25 MCG (1000 UT) CAPS Take 1 capsule by mouth daily.    EPINEPHrine  0.3 mg/0.3 mL IJ SOAJ injection Inject 0.3 mg into the muscle as needed for anaphylaxis.   esomeprazole  (NEXIUM ) 40 MG capsule Take  1 capsule   Daily  to Prevent Heartburn & Indigestion   levocetirizine (XYZAL ) 5 MG tablet Take 1 tablet (5 mg total) by mouth daily as needed for allergies (Can take an extra dose during flare ups.).   Multiple Vitamins-Minerals (MULTIVITAMIN WITH MINERALS) tablet Take 1 tablet by mouth daily.   Omega-3 Fatty Acids (FISH OIL) 1200 MG CAPS Take 2 capsules by mouth daily.   promethazine -dextromethorphan (PROMETHAZINE -DM) 6.25-15 MG/5ML syrup Take 5 mLs by mouth 4 (four) times daily as needed for cough.   vitamin B-12 (CYANOCOBALAMIN ) 500 MCG tablet Take 500 mcg by mouth daily.   azithromycin  (ZITHROMAX ) 250 MG tablet Take 2 tablets (500 mg) on  Day 1,  followed by 1 tablet (250 mg) once daily on Days 2 through 5.   Polyethylene Glycol 3350 (MIRALAX PO) Take 17 g by mouth as needed. (Patient not taking: Reported on 05/18/2023)   Rimegepant Sulfate (NURTEC) 75 MG TBDP Take 1 tab for migraine; can repeat in 2 hours if needed. (Patient not taking: Reported on 05/18/2023)   No current facility-administered medications on file prior to visit.    ROS: all negative except what is noted in the HPI.   Physical Exam:  BP 136/78   Pulse 73   Temp 98 F (36.7 C)   Ht 5' 5 (1.651 m)   Wt 191 lb (86.6 kg)   SpO2 98%   BMI 31.78 kg/m   General Appearance: NAD.  Awake, conversant and cooperative. Eyes: PERRLA, EOMs intact.  Sclera white.  Conjunctiva without erythema. Sinuses: No frontal/maxillary tenderness.  No nasal discharge. Nares patent.  ENT/Mouth: Ext aud canals clear.  Bilateral TMs w/DOL and without erythema or bulging. Hearing intact.  Posterior pharynx without swelling or exudate.  Tonsils without swelling or erythema.  Neck: Supple.  No masses, nodules or thyromegaly. Respiratory: Effort is regular with non-labored breathing. Breath sounds are  equal bilaterally without rales, rhonchi, wheezing or stridor.  Cardio: RRR with no MRGs. Brisk peripheral pulses without edema.  Abdomen: Active BS in all four quadrants.  Soft and non-tender without guarding, rebound tenderness, hernias or masses. Lymphatics: Non tender without lymphadenopathy.  Musculoskeletal: Full ROM, 5/5 strength, normal ambulation.  No clubbing or cyanosis. Skin: Appropriate color for ethnicity. Warm without rashes, lesions, ecchymosis, ulcers.  Neuro: CN II-XII grossly normal. Normal muscle tone without cerebellar symptoms and intact sensation.   Psych: AO X 3,  appropriate mood and affect, insight and judgment.     BASCOM NECESSARY, NP 2:22 PM [facility]

## 2023-05-18 ENCOUNTER — Ambulatory Visit: Payer: PPO | Admitting: Nurse Practitioner

## 2023-05-18 ENCOUNTER — Encounter: Payer: Self-pay | Admitting: Nurse Practitioner

## 2023-05-18 VITALS — BP 136/78 | HR 73 | Temp 98.0°F | Ht 65.0 in | Wt 191.0 lb

## 2023-05-18 DIAGNOSIS — F3341 Major depressive disorder, recurrent, in partial remission: Secondary | ICD-10-CM | POA: Diagnosis not present

## 2023-05-18 DIAGNOSIS — F419 Anxiety disorder, unspecified: Secondary | ICD-10-CM | POA: Diagnosis not present

## 2023-05-18 DIAGNOSIS — Z636 Dependent relative needing care at home: Secondary | ICD-10-CM

## 2023-05-18 DIAGNOSIS — Z79899 Other long term (current) drug therapy: Secondary | ICD-10-CM

## 2023-05-18 MED ORDER — SERTRALINE HCL 25 MG PO TABS: 25.0000 mg | ORAL_TABLET | Freq: Every day | ORAL | 0 refills | Status: DC

## 2023-05-18 NOTE — Patient Instructions (Signed)
 Sertraline Tablets What is this medication? SERTRALINE (SER tra leen) treats depression, anxiety, obsessive-compulsive disorder (OCD), post-traumatic stress disorder (PTSD), and premenstrual dysphoric disorder (PMDD). It increases the amount of serotonin in the brain, a hormone that helps regulate mood. It belongs to a group of medications called SSRIs. This medicine may be used for other purposes; ask your health care provider or pharmacist if you have questions. COMMON BRAND NAME(S): Zoloft What should I tell my care team before I take this medication? They need to know if you have any of these conditions: Bleeding disorders Bipolar disorder or a family history of bipolar disorder Frequently drink alcohol Glaucoma Heart disease High blood pressure History of irregular heartbeat History of low levels of calcium, magnesium, or potassium in the blood Liver disease Receiving electroconvulsive therapy Seizures Suicidal thoughts, plans, or attempt by you or a family member Take medications that prevent or treat blood clots Thyroid disease An unusual or allergic reaction to sertraline, other medications, foods, dyes, or preservatives Pregnant or trying to get pregnant Breastfeeding How should I use this medication? Take this medication by mouth with a glass of water. Take it as directed on the prescription label at the same time every day. You can take it with or without food. If it upsets your stomach, take it with food. Do not take your medication more often than directed. Keep taking this medication unless your care team tells you to stop. Stopping it too quickly can cause serious side effects. It can also make your condition worse. A special MedGuide will be given to you by the pharmacist with each prescription and refill. Be sure to read this information carefully each time. Talk to your care team about the use of this medication in children. While it may be prescribed for children as  young as 7 years for selected conditions, precautions do apply. Overdosage: If you think you have taken too much of this medicine contact a poison control center or emergency room at once. NOTE: This medicine is only for you. Do not share this medicine with others. What if I miss a dose? If you miss a dose, take it as soon as you can. If it is almost time for your next dose, take only that dose. Do not take double or extra doses. What may interact with this medication? Do not take this medication with any of the following: Cisapride Dronedarone Linezolid MAOIs, such as Carbex, Eldepryl, Marplan, Nardil, and Parnate Methylene blue (injected into a vein) Pimozide Thioridazine This medication may also interact with the following: Alcohol Amphetamines Aspirin and aspirin-like medications Certain medications for fungal infections, such as ketoconazole, fluconazole, posaconazole, itraconazole Certain medications for irregular heart beat, such as flecainide, quinidine, propafenone Certain medications for mental health conditions Certain medications for migraine headaches, such as almotriptan, eletriptan, frovatriptan, naratriptan, rizatriptan, sumatriptan, zolmitriptan Certain medications for seizures, such as carbamazepine, valproic acid, phenytoin Certain medications for sleep Certain medications that prevent or treat blood clots, such as warfarin, enoxaparin, dalteparin Cimetidine Digoxin Diuretics Fentanyl Isoniazid Lithium NSAIDs, medications for pain and inflammation, such as ibuprofen or naproxen Other medications that cause heart rhythm changes, such as dofetilide Rasagiline Safinamide Supplements, such as St. John's wort, kava kava, valerian Tolbutamide Tramadol Tryptophan This list may not describe all possible interactions. Give your health care provider a list of all the medicines, herbs, non-prescription drugs, or dietary supplements you use. Also tell them if you smoke,  drink alcohol, or use illegal drugs. Some items may interact with your medicine.  What should I watch for while using this medication? Tell your care team if your symptoms do not get better or if they get worse. Visit your care team for regular checks on your progress. Because it may take several weeks to see the full effects of this medication, it is important to continue your treatment as prescribed by your care team. Patients and their families should watch out for new or worsening thoughts of suicide or depression. Also watch out for sudden changes in feelings such as feeling anxious, agitated, panicky, irritable, hostile, aggressive, impulsive, severely restless, overly excited and hyperactive, or not being able to sleep. If this happens, especially at the beginning of treatment or after a change in dose, call your care team. This medication may affect your coordination, reaction time, or judgment. Do not drive or operate machinery until you know how this medication affects you. Sit or stand up slowly to reduce the risk of dizzy or fainting spells. Drinking alcohol with this medication can increase the risk of these side effects. Your mouth may get dry. Chewing sugarless gum or sucking hard candy, and drinking plenty of water may help. Contact your care team if the problem does not go away or is severe. What side effects may I notice from receiving this medication? Side effects that you should report to your care team as soon as possible: Allergic reactions--skin rash, itching, hives, swelling of the face, lips, tongue, or throat Bleeding--bloody or black, tar-like stools, red or dark brown urine, vomiting blood or brown material that looks like coffee grounds, small red or purple spots on skin, unusual bleeding or bruising Heart rhythm changes--fast or irregular heartbeat, dizziness, feeling faint or lightheaded, chest pain, trouble breathing Low sodium level--muscle weakness, fatigue, dizziness,  headache, confusion Serotonin syndrome--irritability, confusion, fast or irregular heartbeat, muscle stiffness, twitching muscles, sweating, high fever, seizure, chills, vomiting, diarrhea Sudden eye pain or change in vision such as blurred vision, seeing halos around lights, vision loss Thoughts of suicide or self-harm, worsening mood Side effects that usually do not require medical attention (report these to your care team if they continue or are bothersome): Change in sex drive or performance Diarrhea Excessive sweating Nausea Tremors or shaking Upset stomach This list may not describe all possible side effects. Call your doctor for medical advice about side effects. You may report side effects to FDA at 1-800-FDA-1088. Where should I keep my medication? Keep out of the reach of children and pets. Store at room temperature between 20 and 25 degrees C (68 and 77 degrees F). Get rid of any unused medication after the expiration date. To get rid of medications that are no longer needed or expired: Take the medication to a medication take-back program. Check with your pharmacy or law enforcement to find a location. If you cannot return the medication, check the label or package insert to see if the medication should be thrown out in the garbage or flushed down the toilet. If you are not sure, ask your care team. If it is safe to put in the trash, empty the medication out of the container. Mix the medication with cat litter, dirt, coffee grounds, or other unwanted substance. Seal the mixture in a bag or container. Put it in the trash. NOTE: This sheet is a summary. It may not cover all possible information. If you have questions about this medicine, talk to your doctor, pharmacist, or health care provider.  2024 Elsevier/Gold Standard (2021-12-02 00:00:00)

## 2023-05-26 ENCOUNTER — Ambulatory Visit (INDEPENDENT_AMBULATORY_CARE_PROVIDER_SITE_OTHER): Payer: PPO

## 2023-05-26 DIAGNOSIS — J309 Allergic rhinitis, unspecified: Secondary | ICD-10-CM

## 2023-06-02 ENCOUNTER — Ambulatory Visit (INDEPENDENT_AMBULATORY_CARE_PROVIDER_SITE_OTHER): Payer: Self-pay

## 2023-06-02 DIAGNOSIS — J309 Allergic rhinitis, unspecified: Secondary | ICD-10-CM

## 2023-06-09 ENCOUNTER — Ambulatory Visit (INDEPENDENT_AMBULATORY_CARE_PROVIDER_SITE_OTHER): Payer: Self-pay

## 2023-06-09 DIAGNOSIS — J309 Allergic rhinitis, unspecified: Secondary | ICD-10-CM

## 2023-06-10 ENCOUNTER — Other Ambulatory Visit: Payer: Self-pay | Admitting: Nurse Practitioner

## 2023-06-10 MED ORDER — SERTRALINE HCL 25 MG PO TABS: 25.0000 mg | ORAL_TABLET | Freq: Every day | ORAL | 0 refills | Status: DC

## 2023-06-16 ENCOUNTER — Ambulatory Visit (INDEPENDENT_AMBULATORY_CARE_PROVIDER_SITE_OTHER): Payer: Self-pay | Admitting: *Deleted

## 2023-06-16 DIAGNOSIS — J309 Allergic rhinitis, unspecified: Secondary | ICD-10-CM | POA: Diagnosis not present

## 2023-06-23 ENCOUNTER — Ambulatory Visit (INDEPENDENT_AMBULATORY_CARE_PROVIDER_SITE_OTHER): Payer: Self-pay

## 2023-06-23 DIAGNOSIS — J309 Allergic rhinitis, unspecified: Secondary | ICD-10-CM

## 2023-06-30 ENCOUNTER — Ambulatory Visit (INDEPENDENT_AMBULATORY_CARE_PROVIDER_SITE_OTHER): Payer: Self-pay

## 2023-06-30 DIAGNOSIS — J309 Allergic rhinitis, unspecified: Secondary | ICD-10-CM

## 2023-07-09 ENCOUNTER — Ambulatory Visit (INDEPENDENT_AMBULATORY_CARE_PROVIDER_SITE_OTHER): Payer: PPO

## 2023-07-09 DIAGNOSIS — J309 Allergic rhinitis, unspecified: Secondary | ICD-10-CM

## 2023-07-13 ENCOUNTER — Other Ambulatory Visit: Payer: Self-pay

## 2023-07-13 MED ORDER — SERTRALINE HCL 25 MG PO TABS: 25.0000 mg | ORAL_TABLET | Freq: Every day | ORAL | 0 refills | Status: DC

## 2023-07-14 ENCOUNTER — Ambulatory Visit (INDEPENDENT_AMBULATORY_CARE_PROVIDER_SITE_OTHER): Payer: Self-pay

## 2023-07-14 DIAGNOSIS — J309 Allergic rhinitis, unspecified: Secondary | ICD-10-CM

## 2023-07-16 ENCOUNTER — Ambulatory Visit: Payer: PPO | Admitting: Allergy & Immunology

## 2023-07-21 ENCOUNTER — Ambulatory Visit (INDEPENDENT_AMBULATORY_CARE_PROVIDER_SITE_OTHER): Payer: Self-pay

## 2023-07-21 DIAGNOSIS — J309 Allergic rhinitis, unspecified: Secondary | ICD-10-CM

## 2023-07-28 ENCOUNTER — Ambulatory Visit (INDEPENDENT_AMBULATORY_CARE_PROVIDER_SITE_OTHER): Payer: Self-pay

## 2023-07-28 DIAGNOSIS — J309 Allergic rhinitis, unspecified: Secondary | ICD-10-CM

## 2023-08-02 ENCOUNTER — Ambulatory Visit: Payer: PPO | Admitting: Nurse Practitioner

## 2023-08-04 ENCOUNTER — Ambulatory Visit (INDEPENDENT_AMBULATORY_CARE_PROVIDER_SITE_OTHER): Payer: Self-pay

## 2023-08-04 DIAGNOSIS — J309 Allergic rhinitis, unspecified: Secondary | ICD-10-CM | POA: Diagnosis not present

## 2023-08-10 ENCOUNTER — Other Ambulatory Visit: Payer: Self-pay | Admitting: Family

## 2023-08-11 ENCOUNTER — Ambulatory Visit (INDEPENDENT_AMBULATORY_CARE_PROVIDER_SITE_OTHER): Payer: Self-pay

## 2023-08-11 DIAGNOSIS — J309 Allergic rhinitis, unspecified: Secondary | ICD-10-CM

## 2023-08-18 ENCOUNTER — Ambulatory Visit (INDEPENDENT_AMBULATORY_CARE_PROVIDER_SITE_OTHER): Payer: Self-pay

## 2023-08-18 DIAGNOSIS — J309 Allergic rhinitis, unspecified: Secondary | ICD-10-CM

## 2023-08-31 DIAGNOSIS — J3081 Allergic rhinitis due to animal (cat) (dog) hair and dander: Secondary | ICD-10-CM | POA: Diagnosis not present

## 2023-08-31 NOTE — Progress Notes (Signed)
 VIALS MADE 08-31-23. EXP 08-30-24

## 2023-09-01 DIAGNOSIS — J302 Other seasonal allergic rhinitis: Secondary | ICD-10-CM | POA: Diagnosis not present

## 2023-09-08 ENCOUNTER — Ambulatory Visit (INDEPENDENT_AMBULATORY_CARE_PROVIDER_SITE_OTHER): Payer: Self-pay

## 2023-09-08 DIAGNOSIS — J309 Allergic rhinitis, unspecified: Secondary | ICD-10-CM

## 2023-09-14 DIAGNOSIS — F418 Other specified anxiety disorders: Secondary | ICD-10-CM | POA: Diagnosis not present

## 2023-09-14 DIAGNOSIS — Z8669 Personal history of other diseases of the nervous system and sense organs: Secondary | ICD-10-CM | POA: Diagnosis not present

## 2023-09-14 DIAGNOSIS — I700 Atherosclerosis of aorta: Secondary | ICD-10-CM | POA: Diagnosis not present

## 2023-09-14 DIAGNOSIS — K219 Gastro-esophageal reflux disease without esophagitis: Secondary | ICD-10-CM | POA: Diagnosis not present

## 2023-09-14 DIAGNOSIS — H35343 Macular cyst, hole, or pseudohole, bilateral: Secondary | ICD-10-CM | POA: Diagnosis not present

## 2023-09-14 DIAGNOSIS — D649 Anemia, unspecified: Secondary | ICD-10-CM | POA: Diagnosis not present

## 2023-09-14 DIAGNOSIS — J302 Other seasonal allergic rhinitis: Secondary | ICD-10-CM | POA: Diagnosis not present

## 2023-09-14 DIAGNOSIS — G43909 Migraine, unspecified, not intractable, without status migrainosus: Secondary | ICD-10-CM | POA: Diagnosis not present

## 2023-09-14 DIAGNOSIS — I4510 Unspecified right bundle-branch block: Secondary | ICD-10-CM | POA: Diagnosis not present

## 2023-09-14 DIAGNOSIS — Z7689 Persons encountering health services in other specified circumstances: Secondary | ICD-10-CM | POA: Diagnosis not present

## 2023-09-14 DIAGNOSIS — R7303 Prediabetes: Secondary | ICD-10-CM | POA: Diagnosis not present

## 2023-09-15 ENCOUNTER — Ambulatory Visit (INDEPENDENT_AMBULATORY_CARE_PROVIDER_SITE_OTHER)

## 2023-09-15 DIAGNOSIS — J309 Allergic rhinitis, unspecified: Secondary | ICD-10-CM | POA: Diagnosis not present

## 2023-09-22 ENCOUNTER — Ambulatory Visit (INDEPENDENT_AMBULATORY_CARE_PROVIDER_SITE_OTHER): Payer: Self-pay

## 2023-09-22 DIAGNOSIS — J309 Allergic rhinitis, unspecified: Secondary | ICD-10-CM

## 2023-09-29 ENCOUNTER — Ambulatory Visit (INDEPENDENT_AMBULATORY_CARE_PROVIDER_SITE_OTHER)

## 2023-09-29 DIAGNOSIS — J309 Allergic rhinitis, unspecified: Secondary | ICD-10-CM | POA: Diagnosis not present

## 2023-10-06 ENCOUNTER — Ambulatory Visit (INDEPENDENT_AMBULATORY_CARE_PROVIDER_SITE_OTHER)

## 2023-10-06 DIAGNOSIS — J309 Allergic rhinitis, unspecified: Secondary | ICD-10-CM

## 2023-10-07 DIAGNOSIS — I700 Atherosclerosis of aorta: Secondary | ICD-10-CM | POA: Diagnosis not present

## 2023-10-07 DIAGNOSIS — R7303 Prediabetes: Secondary | ICD-10-CM | POA: Diagnosis not present

## 2023-10-07 DIAGNOSIS — D649 Anemia, unspecified: Secondary | ICD-10-CM | POA: Diagnosis not present

## 2023-10-13 DIAGNOSIS — R7303 Prediabetes: Secondary | ICD-10-CM | POA: Diagnosis not present

## 2023-10-13 DIAGNOSIS — I4510 Unspecified right bundle-branch block: Secondary | ICD-10-CM | POA: Diagnosis not present

## 2023-10-13 DIAGNOSIS — F418 Other specified anxiety disorders: Secondary | ICD-10-CM | POA: Diagnosis not present

## 2023-10-13 DIAGNOSIS — I700 Atherosclerosis of aorta: Secondary | ICD-10-CM | POA: Diagnosis not present

## 2023-10-13 DIAGNOSIS — Z0001 Encounter for general adult medical examination with abnormal findings: Secondary | ICD-10-CM | POA: Diagnosis not present

## 2023-10-13 DIAGNOSIS — G43909 Migraine, unspecified, not intractable, without status migrainosus: Secondary | ICD-10-CM | POA: Diagnosis not present

## 2023-10-13 DIAGNOSIS — J302 Other seasonal allergic rhinitis: Secondary | ICD-10-CM | POA: Diagnosis not present

## 2023-10-13 DIAGNOSIS — D649 Anemia, unspecified: Secondary | ICD-10-CM | POA: Diagnosis not present

## 2023-10-13 DIAGNOSIS — Z8669 Personal history of other diseases of the nervous system and sense organs: Secondary | ICD-10-CM | POA: Diagnosis not present

## 2023-10-13 DIAGNOSIS — J069 Acute upper respiratory infection, unspecified: Secondary | ICD-10-CM | POA: Diagnosis not present

## 2023-10-13 DIAGNOSIS — H35343 Macular cyst, hole, or pseudohole, bilateral: Secondary | ICD-10-CM | POA: Diagnosis not present

## 2023-10-13 DIAGNOSIS — K219 Gastro-esophageal reflux disease without esophagitis: Secondary | ICD-10-CM | POA: Diagnosis not present

## 2023-10-27 ENCOUNTER — Ambulatory Visit (INDEPENDENT_AMBULATORY_CARE_PROVIDER_SITE_OTHER): Payer: Self-pay

## 2023-10-27 DIAGNOSIS — J309 Allergic rhinitis, unspecified: Secondary | ICD-10-CM

## 2023-11-03 ENCOUNTER — Ambulatory Visit (INDEPENDENT_AMBULATORY_CARE_PROVIDER_SITE_OTHER): Payer: Self-pay

## 2023-11-03 DIAGNOSIS — J309 Allergic rhinitis, unspecified: Secondary | ICD-10-CM | POA: Diagnosis not present

## 2023-11-10 ENCOUNTER — Ambulatory Visit (INDEPENDENT_AMBULATORY_CARE_PROVIDER_SITE_OTHER): Payer: Self-pay

## 2023-11-10 DIAGNOSIS — J309 Allergic rhinitis, unspecified: Secondary | ICD-10-CM

## 2023-11-11 DIAGNOSIS — F418 Other specified anxiety disorders: Secondary | ICD-10-CM | POA: Diagnosis not present

## 2023-11-17 ENCOUNTER — Ambulatory Visit (INDEPENDENT_AMBULATORY_CARE_PROVIDER_SITE_OTHER): Payer: Self-pay

## 2023-11-17 DIAGNOSIS — J309 Allergic rhinitis, unspecified: Secondary | ICD-10-CM | POA: Diagnosis not present

## 2023-11-24 ENCOUNTER — Ambulatory Visit (INDEPENDENT_AMBULATORY_CARE_PROVIDER_SITE_OTHER): Payer: Self-pay

## 2023-11-24 DIAGNOSIS — J309 Allergic rhinitis, unspecified: Secondary | ICD-10-CM

## 2023-12-02 DIAGNOSIS — J3081 Allergic rhinitis due to animal (cat) (dog) hair and dander: Secondary | ICD-10-CM | POA: Diagnosis not present

## 2023-12-02 NOTE — Progress Notes (Signed)
 VIALS MADE 12-02-23

## 2023-12-03 DIAGNOSIS — J302 Other seasonal allergic rhinitis: Secondary | ICD-10-CM | POA: Diagnosis not present

## 2023-12-08 ENCOUNTER — Ambulatory Visit (INDEPENDENT_AMBULATORY_CARE_PROVIDER_SITE_OTHER): Payer: Self-pay

## 2023-12-08 DIAGNOSIS — J309 Allergic rhinitis, unspecified: Secondary | ICD-10-CM | POA: Diagnosis not present

## 2023-12-15 DIAGNOSIS — J069 Acute upper respiratory infection, unspecified: Secondary | ICD-10-CM | POA: Diagnosis not present

## 2023-12-17 ENCOUNTER — Other Ambulatory Visit (HOSPITAL_COMMUNITY): Payer: Self-pay

## 2023-12-17 DIAGNOSIS — J069 Acute upper respiratory infection, unspecified: Secondary | ICD-10-CM

## 2024-01-07 ENCOUNTER — Ambulatory Visit (INDEPENDENT_AMBULATORY_CARE_PROVIDER_SITE_OTHER)

## 2024-01-07 DIAGNOSIS — J309 Allergic rhinitis, unspecified: Secondary | ICD-10-CM | POA: Diagnosis not present

## 2024-01-12 ENCOUNTER — Ambulatory Visit: Payer: Self-pay

## 2024-01-12 ENCOUNTER — Encounter: Payer: Self-pay | Admitting: Allergy & Immunology

## 2024-01-12 ENCOUNTER — Ambulatory Visit (INDEPENDENT_AMBULATORY_CARE_PROVIDER_SITE_OTHER): Admitting: Allergy & Immunology

## 2024-01-12 VITALS — BP 130/80 | HR 64 | Temp 98.3°F | Resp 18

## 2024-01-12 DIAGNOSIS — J302 Other seasonal allergic rhinitis: Secondary | ICD-10-CM | POA: Diagnosis not present

## 2024-01-12 DIAGNOSIS — J309 Allergic rhinitis, unspecified: Secondary | ICD-10-CM

## 2024-01-12 DIAGNOSIS — J4520 Mild intermittent asthma, uncomplicated: Secondary | ICD-10-CM

## 2024-01-12 DIAGNOSIS — J3089 Other allergic rhinitis: Secondary | ICD-10-CM

## 2024-01-12 DIAGNOSIS — M950 Acquired deformity of nose: Secondary | ICD-10-CM | POA: Diagnosis not present

## 2024-01-12 MED ORDER — EPINEPHRINE 0.3 MG/0.3ML IJ SOAJ: 0.3000 mg | INTRAMUSCULAR | 1 refills | Status: AC | PRN

## 2024-01-12 NOTE — Progress Notes (Signed)
 FOLLOW UP  Date of Service/Encounter:  01/12/24   Assessment:   Seasonal and perennial allergic rhinitis (grasses, ragweed, trees, indoor molds, outdoor molds, cat, and dog) - with shortness of breath after the last couple of allergy shots    Trigeminal neuralgia - with resulting nasal deformity, therefore unable to do nasal sprays   Multiple ophthalmic issues including a macular hole as well as retinal detachments    Plan/Recommendations:   Seasonal and perennial allergic rhinitis (grasses, ragweed, trees, indoor molds, outdoor molds, cat, and dog) - I am not convinced that this is shortness of breath is related to the shot. - Try using albuterol  when this happens to see if it helps. - Continue levocetirizine 5 mg once a day as needed for a runny nose or itch   Allergic conjunctivitis Some over the counter eye drops include Pataday one drop in each eye once a day as needed for red, itchy eyes OR Zaditor one drop in each eye twice a day as needed for red itchy eyes. Avoid eye drops that say red eye relief as they may contain medications that dry out your eyes.    Shortness of breath/cough - maybe correlated with the allergy shot - Breathing testing looked excellent today. - But again, definitely try the albuterol  2 puffs as needed during these episodes to see if it is related. - We may consider a heart cause as well.   Return in about 3 months (around 04/13/2024). You can have the follow up appointment with Dr. Iva or a Nurse Practicioner (our Nurse Practitioners are excellent and always have Physician oversight!).     Subjective:   Margaret Gilmore is a 68 y.o. female presenting today for follow up of  Chief Complaint  Patient presents with   Medication Reaction    States having breathing issues. Pt reports dizziness, mild shortness of breath.    Margaret Gilmore has a history of the following: Patient Active Problem List   Diagnosis Date Noted   Seasonal and  perennial allergic rhinitis 03/10/2023   Seasonal allergic conjunctivitis 03/10/2023   Shortness of breath 03/10/2023   Non-restorative sleep 12/29/2021   Uncontrolled morning headache 12/29/2021   Acquired facial deformity 12/29/2021   GERD with stricture 12/29/2021   Depression 12/29/2021   Excessive daytime sleepiness 12/29/2021   Hyperlipidemia 01/30/2021   Nasal deformity, acquired 07/10/2020   Chronic rhinitis 05/08/2020   Right bundle branch block (RBBB) 12/16/2017   Obesity (BMI 30.0-34.9) 08/19/2017   Macular hole, right eye 06/22/2017   Macular hole, left eye 02/11/2015   Other abnormal glucose (hx of prediabetes) 01/23/2014   Vitamin D  deficiency    Anxiety    Depression, major, recurrent (HCC)     History obtained from: chart review and patient.  Discussed the use of AI scribe software for clinical note transcription with the patient and/or guardian, who gave verbal consent to proceed.  Margaret Gilmore is a 68 y.o. female presenting for a sick visit.  She was last seen in October 2024 by Arlean Mutter one of our nurse practitioners.  At that time, she was continued on levocetirizine as well as Astelin .  She was also restarted on her allergy shots.  For her shortness of breath, she continue with albuterol  2 puffs every 4-6 hours as needed.  In the interim, she has done well, but today reported that she was having shortness of breath following her last few allergy injections.  She experiences shortness of breath frequently after receiving  allergy shots, describing it as feeling like breathing through a mask with a sensation of congestion. She has not used albuterol  in years and does not currently have an inhaler.  She feels lightheaded often but tends to ignore it. For the past two months, she has had a persistent cough, initially not associating it with the allergy shots. No severe shortness of breath before today's visit, although she mentions some suggestion of it.  She has a history  of nasal reconstruction surgery performed a year ago by an oral surgeon following a referral from an ear, nose, and throat specialist. She is unsure if her current symptoms are related to nasal obstruction or other factors such as her weight.  Otherwise, there have been no changes to her past medical history, surgical history, family history, or social history.    Review of systems otherwise negative other than that mentioned in the HPI.    Objective:   Blood pressure 130/80, pulse 64, temperature 98.3 F (36.8 C), temperature source Temporal, resp. rate 18, SpO2 100%. There is no height or weight on file to calculate BMI.    Physical Exam Vitals reviewed.  Constitutional:      Appearance: She is well-developed.     Comments: Friendly. Cooperative to the exam.   HENT:     Head: Normocephalic and atraumatic.     Right Ear: Tympanic membrane, ear canal and external ear normal.     Left Ear: Tympanic membrane, ear canal and external ear normal.     Nose: No nasal deformity, septal deviation, mucosal edema or rhinorrhea.     Right Turbinates: Not enlarged or swollen.     Left Turbinates: Not enlarged or swollen.     Right Sinus: No maxillary sinus tenderness or frontal sinus tenderness.     Left Sinus: No maxillary sinus tenderness or frontal sinus tenderness.     Mouth/Throat:     Mouth: Mucous membranes are not pale and not dry.     Pharynx: Uvula midline.  Eyes:     General: Lids are normal. No allergic shiner.       Right eye: No discharge.        Left eye: No discharge.     Conjunctiva/sclera: Conjunctivae normal.     Right eye: Right conjunctiva is not injected. No chemosis.    Left eye: Left conjunctiva is not injected. No chemosis.    Pupils: Pupils are equal, round, and reactive to light.  Cardiovascular:     Rate and Rhythm: Normal rate and regular rhythm.     Heart sounds: Normal heart sounds.  Pulmonary:     Effort: Pulmonary effort is normal. No tachypnea,  accessory muscle usage or respiratory distress.     Breath sounds: Normal breath sounds. No wheezing, rhonchi or rales.  Chest:     Chest wall: No tenderness.  Lymphadenopathy:     Cervical: No cervical adenopathy.  Skin:    Coloration: Skin is not pale.     Findings: No abrasion, erythema, petechiae or rash. Rash is not papular, urticarial or vesicular.  Neurological:     Mental Status: She is alert.  Psychiatric:        Behavior: Behavior is cooperative.      Diagnostic studies:    Spirometry: results normal (FEV1: 1.94/84%, FVC: 2.31/78%, FEV1/FVC: 84%).    Spirometry consistent with normal pattern.   Allergy Studies: none       Marty Shaggy, MD  [facility] 

## 2024-01-12 NOTE — Patient Instructions (Addendum)
 Seasonal and perennial allergic rhinitis (skin test positive un:hmjddzd, ragweed, trees, indoor molds, outdoor molds, cat, and dog) - I am not convinced that this is shortness of breath is related to the shot. - Try using albuterol  when this happens to see if it helps. - Continue levocetirizine 5 mg once a day as needed for a runny nose or itch   Allergic conjunctivitis Some over the counter eye drops include Pataday one drop in each eye once a day as needed for red, itchy eyes OR Zaditor one drop in each eye twice a day as needed for red itchy eyes. Avoid eye drops that say red eye relief as they may contain medications that dry out your eyes.    Shortness of breath/cough - maybe correlated with the allergy shot - Breathing testing looked excellent today. - But again, definitely try the albuterol  2 puffs as needed during these episodes to see if it is related. - We may consider a heart cause as well.   Return in about 3 months (around 04/13/2024). You can have the follow up appointment with Dr. Iva or a Nurse Practicioner (our Nurse Practitioners are excellent and always have Physician oversight!).    Please inform us  of any Emergency Department visits, hospitalizations, or changes in symptoms. Call us  before going to the ED for breathing or allergy symptoms since we might be able to fit you in for a sick visit. Feel free to contact us  anytime with any questions, problems, or concerns.  It was a pleasure to see you again today!  Websites that have reliable patient information: 1. American Academy of Asthma, Allergy, and Immunology: www.aaaai.org 2. Food Allergy Research and Education (FARE): foodallergy.org 3. Mothers of Asthmatics: http://www.asthmacommunitynetwork.org 4. American College of Allergy, Asthma, and Immunology: www.acaai.org      "Like" us  on Facebook and Instagram for our latest updates!      A healthy democracy works best when [employer] participate! Make  sure you are registered to vote! If you have moved or changed any of your contact information, you will need to get this updated before voting! Scan the QR codes below to learn more!

## 2024-01-19 ENCOUNTER — Ambulatory Visit (INDEPENDENT_AMBULATORY_CARE_PROVIDER_SITE_OTHER)

## 2024-01-19 DIAGNOSIS — J309 Allergic rhinitis, unspecified: Secondary | ICD-10-CM | POA: Diagnosis not present

## 2024-01-26 ENCOUNTER — Ambulatory Visit (INDEPENDENT_AMBULATORY_CARE_PROVIDER_SITE_OTHER): Payer: Self-pay

## 2024-01-26 DIAGNOSIS — J309 Allergic rhinitis, unspecified: Secondary | ICD-10-CM

## 2024-02-02 ENCOUNTER — Ambulatory Visit (INDEPENDENT_AMBULATORY_CARE_PROVIDER_SITE_OTHER): Payer: Self-pay

## 2024-02-02 DIAGNOSIS — J309 Allergic rhinitis, unspecified: Secondary | ICD-10-CM | POA: Diagnosis not present

## 2024-02-09 ENCOUNTER — Ambulatory Visit (INDEPENDENT_AMBULATORY_CARE_PROVIDER_SITE_OTHER): Payer: Self-pay

## 2024-02-09 DIAGNOSIS — J309 Allergic rhinitis, unspecified: Secondary | ICD-10-CM

## 2024-02-16 ENCOUNTER — Ambulatory Visit (INDEPENDENT_AMBULATORY_CARE_PROVIDER_SITE_OTHER): Payer: Self-pay

## 2024-02-16 DIAGNOSIS — J309 Allergic rhinitis, unspecified: Secondary | ICD-10-CM

## 2024-02-23 ENCOUNTER — Ambulatory Visit (INDEPENDENT_AMBULATORY_CARE_PROVIDER_SITE_OTHER): Payer: Self-pay

## 2024-02-23 DIAGNOSIS — J309 Allergic rhinitis, unspecified: Secondary | ICD-10-CM | POA: Diagnosis not present

## 2024-03-08 ENCOUNTER — Ambulatory Visit (INDEPENDENT_AMBULATORY_CARE_PROVIDER_SITE_OTHER): Payer: Self-pay

## 2024-03-08 DIAGNOSIS — J309 Allergic rhinitis, unspecified: Secondary | ICD-10-CM

## 2024-03-15 ENCOUNTER — Encounter: Payer: PPO | Admitting: Nurse Practitioner

## 2024-03-17 ENCOUNTER — Ambulatory Visit: Payer: PPO | Admitting: Allergy & Immunology

## 2024-03-21 DIAGNOSIS — J302 Other seasonal allergic rhinitis: Secondary | ICD-10-CM | POA: Diagnosis not present

## 2024-03-21 DIAGNOSIS — Z8669 Personal history of other diseases of the nervous system and sense organs: Secondary | ICD-10-CM | POA: Diagnosis not present

## 2024-03-21 DIAGNOSIS — H35343 Macular cyst, hole, or pseudohole, bilateral: Secondary | ICD-10-CM | POA: Diagnosis not present

## 2024-03-21 DIAGNOSIS — J069 Acute upper respiratory infection, unspecified: Secondary | ICD-10-CM | POA: Diagnosis not present

## 2024-03-28 DIAGNOSIS — S0993XS Unspecified injury of face, sequela: Secondary | ICD-10-CM | POA: Diagnosis not present

## 2024-03-28 DIAGNOSIS — M952 Other acquired deformity of head: Secondary | ICD-10-CM | POA: Diagnosis not present

## 2024-03-28 DIAGNOSIS — G508 Other disorders of trigeminal nerve: Secondary | ICD-10-CM | POA: Diagnosis not present

## 2024-03-28 DIAGNOSIS — R29818 Other symptoms and signs involving the nervous system: Secondary | ICD-10-CM | POA: Diagnosis not present

## 2024-03-28 DIAGNOSIS — J329 Chronic sinusitis, unspecified: Secondary | ICD-10-CM | POA: Diagnosis not present

## 2024-03-29 ENCOUNTER — Other Ambulatory Visit: Payer: Self-pay

## 2024-03-29 ENCOUNTER — Encounter: Payer: Self-pay | Admitting: Allergy & Immunology

## 2024-03-29 ENCOUNTER — Ambulatory Visit: Admitting: Allergy & Immunology

## 2024-03-29 VITALS — BP 130/70 | HR 78 | Temp 98.0°F | Resp 18 | Ht 64.57 in | Wt 196.6 lb

## 2024-03-29 DIAGNOSIS — M950 Acquired deformity of nose: Secondary | ICD-10-CM | POA: Diagnosis not present

## 2024-03-29 DIAGNOSIS — G500 Trigeminal neuralgia: Secondary | ICD-10-CM

## 2024-03-29 DIAGNOSIS — J302 Other seasonal allergic rhinitis: Secondary | ICD-10-CM | POA: Diagnosis not present

## 2024-03-29 DIAGNOSIS — B999 Unspecified infectious disease: Secondary | ICD-10-CM | POA: Diagnosis not present

## 2024-03-29 DIAGNOSIS — J3089 Other allergic rhinitis: Secondary | ICD-10-CM | POA: Diagnosis not present

## 2024-03-29 MED ORDER — ALBUTEROL SULFATE HFA 108 (90 BASE) MCG/ACT IN AERS: 2.0000 | INHALATION_SPRAY | Freq: Four times a day (QID) | RESPIRATORY_TRACT | 2 refills | Status: AC | PRN

## 2024-03-29 NOTE — Progress Notes (Signed)
 FOLLOW UP  Date of Service/Encounter:  03/29/24   Assessment:   Seasonal and perennial allergic rhinitis (grasses, ragweed, trees, indoor molds, outdoor molds, cat, and dog) - with shortness of breath after the last couple of allergy shots    Trigeminal neuralgia - with resulting nasal deformity, therefore unable to do nasal sprays   Multiple ophthalmic issues including a macular hole as well as retinal detachments  Plan/Recommendations:   Seasonal and perennial allergic rhinitis (skin test positive un:hmjddzd, ragweed, trees, indoor molds, outdoor molds, cat, and dog) - Allergy shots restarted today.  - Continue levocetirizine 5 mg once a day as needed for a runny nose or itch  Allergic conjunctivitis - Some over the counter eye drops include Pataday one drop in each eye once a day as needed for red, itchy eyes OR Zaditor one drop in each eye twice a day as needed for red itchy eyes. - Avoid eye drops that say red eye relief as they may contain medications that dry out your eyes.    Shortness of breath/cough - maybe correlated with the allergy shot - Breathing testing not done today.  - This seems to have resolved. - We will send in albuterol  to use two puffs as needed.   Recurrent sinusitis  - We will obtain some screening labs to evaluate your immune system.  - Labs to evaluate the quantitative [facility]) aspects of your immune system: IgG/IgA/IgM, CBC with differential - Labs to evaluate the qualitative (HOW WELL THEY WORK) aspects of your immune system: CH50, Pneumococcal titers, Tetanus titers, Diphtheria titers - We may consider immunizations with Pneumovax and Tdap to challenge your immune system, and then obtain repeat titers in 4-6 weeks.   Return in about 3 months (around 06/29/2024). You can have the follow up appointment with Dr. Iva or a Nurse Practicioner (our Nurse Practitioners are excellent and always have Physician oversight!).    Subjective:   AGATA LUCENTE is a 68 y.o. female presenting today for follow up of  Chief Complaint  Patient presents with   Follow-up    Sinus infection for 2 weeks    Camillia JONELLE Louder has a history of the following: Patient Active Problem List   Diagnosis Date Noted   Seasonal and perennial allergic rhinitis 03/10/2023   Seasonal allergic conjunctivitis 03/10/2023   Shortness of breath 03/10/2023   Non-restorative sleep 12/29/2021   Uncontrolled morning headache 12/29/2021   Acquired facial deformity 12/29/2021   GERD with stricture 12/29/2021   Depression 12/29/2021   Excessive daytime sleepiness 12/29/2021   Hyperlipidemia 01/30/2021   Nasal deformity, acquired 07/10/2020   Chronic rhinitis 05/08/2020   Right bundle branch block (RBBB) 12/16/2017   Obesity (BMI 30.0-34.9) 08/19/2017   Macular hole, right eye 06/22/2017   Macular hole, left eye 02/11/2015   Other abnormal glucose (hx of prediabetes) 01/23/2014   Vitamin D  deficiency    Anxiety    Depression, major, recurrent     History obtained from: chart review and patient.  Discussed the use of AI scribe software for clinical note transcription with the patient and/or guardian, who gave verbal consent to proceed.  Cerys is a 68 y.o. female presenting for a follow up visit. She was last seen in August 2025. At that time, we continued with levocetirizine 5 mg daily as needed for rhinorrhea. She was experiencing some SOB that she felt was related to her allergy shots. We recommedned trying albuterol  to see if that helps. For her conjunctivitis,  we continued with Pataday twice daily. Breathing test looked great.   Since the last visit, she has done well.   Asthma/Respiratory Symptom History: She does not have albuterol  and has not had it in years.  She would like a refill of this.  She will try to use it when she is short of breath to see if this helps at all.   Allergic Rhinitis Symptom History: Rhinitis symptoms are under fair control. She  has not had shots in over one month because of all of these sinus infections. She has been seeing Dr. Gretta at [facility] who did her latest surgery, but he is referring her to see one of his colleagues. She has been referred to see Dr. Marcelina. This appointment has not happened yet. All of these nose issues were secondary to trigeminal neuralgia which she contracted years ago.   She is using her OTC allergy eye drops.   Infection Symptom History: She has had sinus infections three times since July, each of which required antibiotics. This is all managed by her PCP. She has never had an immune workup.   Otherwise, there have been no changes to her past medical history, surgical history, family history, or social history.    Review of systems otherwise negative other than that mentioned in the HPI.    Objective:   Blood pressure 130/70, pulse 78, temperature 98 F (36.7 C), temperature source Temporal, resp. rate 18, height 5' 4.57 (1.64 m), weight 196 lb 9.6 oz (89.2 kg), SpO2 98%. Body mass index is 33.16 kg/m.    Physical Exam Vitals reviewed.  Constitutional:      Appearance: She is well-developed.     Comments: Friendly. Cooperative to the exam. She does have some hoarseness today.   HENT:     Head: Normocephalic and atraumatic.     Right Ear: Tympanic membrane, ear canal and external ear normal.     Left Ear: Tympanic membrane, ear canal and external ear normal.     Nose: Mucosal edema and rhinorrhea present. No nasal deformity or septal deviation.     Right Turbinates: Swollen and pale. Not enlarged.     Left Turbinates: Swollen and pale. Not enlarged.     Right Sinus: No maxillary sinus tenderness or frontal sinus tenderness.     Left Sinus: No maxillary sinus tenderness or frontal sinus tenderness.     Mouth/Throat:     Mouth: Mucous membranes are not pale and not dry.     Pharynx: Uvula midline.  Eyes:     General: Lids are normal. No allergic shiner.       Right eye: No  discharge.        Left eye: No discharge.     Conjunctiva/sclera: Conjunctivae normal.     Right eye: Right conjunctiva is not injected. No chemosis.    Left eye: Left conjunctiva is not injected. No chemosis.    Pupils: Pupils are equal, round, and reactive to light.  Cardiovascular:     Rate and Rhythm: Normal rate and regular rhythm.     Heart sounds: Normal heart sounds.  Pulmonary:     Effort: Pulmonary effort is normal. No tachypnea, accessory muscle usage or respiratory distress.     Breath sounds: Normal breath sounds. No wheezing, rhonchi or rales.  Chest:     Chest wall: No tenderness.  Lymphadenopathy:     Cervical: No cervical adenopathy.  Skin:    Coloration: Skin is not pale.  Findings: No abrasion, erythema, petechiae or rash. Rash is not papular, urticarial or vesicular.  Neurological:     Mental Status: She is alert.  Psychiatric:        Behavior: Behavior is cooperative.      Diagnostic studies: none      Marty Shaggy, MD  [facility] 

## 2024-03-29 NOTE — Patient Instructions (Addendum)
 Seasonal and perennial allergic rhinitis (skin test positive un:hmjddzd, ragweed, trees, indoor molds, outdoor molds, cat, and dog) - Allergy shots restarted today.  - Continue levocetirizine 5 mg once a day as needed for a runny nose or itch  Allergic conjunctivitis - Some over the counter eye drops include Pataday one drop in each eye once a day as needed for red, itchy eyes OR Zaditor one drop in each eye twice a day as needed for red itchy eyes. - Avoid eye drops that say red eye relief as they may contain medications that dry out your eyes.    Shortness of breath/cough - maybe correlated with the allergy shot - Breathing testing not done today.  - This seems to have resolved. - We will send in albuterol  to use two puffs as needed.   Recurrent sinusitis  - We will obtain some screening labs to evaluate your immune system.  - Labs to evaluate the quantitative [facility]) aspects of your immune system: IgG/IgA/IgM, CBC with differential - Labs to evaluate the qualitative (HOW WELL THEY WORK) aspects of your immune system: CH50, Pneumococcal titers, Tetanus titers, Diphtheria titers - We may consider immunizations with Pneumovax and Tdap to challenge your immune system, and then obtain repeat titers in 4-6 weeks.   Return in about 3 months (around 06/29/2024). You can have the follow up appointment with Dr. Iva or a Nurse Practicioner (our Nurse Practitioners are excellent and always have Physician oversight!).    Please inform us  of any Emergency Department visits, hospitalizations, or changes in symptoms. Call us  before going to the ED for breathing or allergy symptoms since we might be able to fit you in for a sick visit. Feel free to contact us  anytime with any questions, problems, or concerns.  It was a pleasure to see you again today!  Websites that have reliable patient information: 1. American Academy of Asthma, Allergy, and Immunology: www.aaaai.org 2. Food Allergy Research  and Education (FARE): foodallergy.org 3. Mothers of Asthmatics: http://www.asthmacommunitynetwork.org 4. American College of Allergy, Asthma, and Immunology: www.acaai.org      "Like" us  on Facebook and Instagram for our latest updates!      A healthy democracy works best when [employer] participate! Make sure you are registered to vote! If you have moved or changed any of your contact information, you will need to get this updated before voting! Scan the QR codes below to learn more!

## 2024-04-04 LAB — IGG, IGA, IGM
IgM (Immunoglobulin M), Srm: 98 mg/dL (ref 26–217)
Immunoglobulin A, (IgA) QN, Serum: 317 mg/dL (ref 87–352)

## 2024-04-04 LAB — STREP PNEUMONIAE 23 SEROTYPES IGG
Pneumo Ab Type 1*: <0.1 ug/mL — ABNORMAL LOW (ref >1.3)
Pneumo Ab Type 12 (12F)*: 11.5 ug/mL (ref >1.3)
Pneumo Ab Type 14*: >30.6 ug/mL (ref >1.3)
Pneumo Ab Type 17 (17F)*: 1.1 ug/mL — ABNORMAL LOW (ref >1.3)
Pneumo Ab Type 19 (19F)*: 1.7 ug/mL (ref >1.3)
Pneumo Ab Type 2*: 3.1 ug/mL (ref >1.3)
Pneumo Ab Type 20*: 0.5 ug/mL — ABNORMAL LOW (ref >1.3)
Pneumo Ab Type 22 (22F)*: <0.1 ug/mL — ABNORMAL LOW (ref >1.3)
Pneumo Ab Type 23 (23F)*: 11.6 ug/mL (ref >1.3)
Pneumo Ab Type 26 (6B)*: <0.1 ug/mL — ABNORMAL LOW (ref >1.3)
Pneumo Ab Type 3*: <0.1 ug/mL — ABNORMAL LOW (ref >1.3)
Pneumo Ab Type 34 (10A)*: 4.1 ug/mL (ref >1.3)
Pneumo Ab Type 4*: 16.7 ug/mL (ref >1.3)
Pneumo Ab Type 43 (11A)*: 0.7 ug/mL — ABNORMAL LOW (ref >1.3)
Pneumo Ab Type 5*: <0.1 ug/mL — ABNORMAL LOW (ref >1.3)
Pneumo Ab Type 51 (7F)*: 0.3 ug/mL — ABNORMAL LOW (ref >1.3)
Pneumo Ab Type 54 (15B)*: 0.8 ug/mL — ABNORMAL LOW (ref >1.3)
Pneumo Ab Type 56 (18C)*: 1.6 ug/mL (ref >1.3)
Pneumo Ab Type 57 (19A)*: 1.2 ug/mL — ABNORMAL LOW (ref >1.3)
Pneumo Ab Type 68 (9V)*: 3.6 ug/mL (ref >1.3)
Pneumo Ab Type 70 (33F)*: 16.5 ug/mL (ref >1.3)
Pneumo Ab Type 8*: 4 ug/mL (ref >1.3)
Pneumo Ab Type 9 (9N)*: 1.9 ug/mL (ref >1.3)

## 2024-04-04 LAB — LYMPH ENUMERATION, BASIC & NK CELLS
% CD 3 Pos. Lymph.: 92.8 % — ABNORMAL HIGH (ref 57.5–86.2)
% CD 4 Pos. Lymph.: 63.5 % — ABNORMAL HIGH (ref 30.8–58.5)
% NK (CD56/16): 3.4 % (ref 1.4–19.4)
Ab NK (CD56/16): 85 /uL (ref 24–406)
Absolute CD 3: 2320 /uL (ref 622–2402)
Absolute CD 4 Helper: 1588 /uL — ABNORMAL HIGH (ref 359–1519)
Basophils Absolute: 0.1 x10E3/uL (ref 0.0–0.2)
Basos: 1 %
CD19 % B Cell: 2.9 % — ABNORMAL LOW (ref 3.3–25.4)
CD19 Abs: 73 /uL (ref 12–645)
CD4/CD8 Ratio: 2.17 (ref 0.92–3.72)
CD8 % Suppressor T Cell: 29.2 % (ref 12.0–35.5)
CD8 T Cell Abs: 730 /uL (ref 109–897)
EOS (ABSOLUTE): 0.1 x10E3/uL (ref 0.0–0.4)
Eos: 2 %
Hematocrit: 40.1 % (ref 34.0–46.6)
Hemoglobin: 12.7 g/dL (ref 11.1–15.9)
Immature Grans (Abs): 0 x10E3/uL (ref 0.0–0.1)
Immature Granulocytes: 0 %
Lymphocytes Absolute: 2.5 x10E3/uL (ref 0.7–3.1)
Lymphs: 35 %
MCH: 28.3 pg (ref 26.6–33.0)
MCHC: 31.7 g/dL (ref 31.5–35.7)
MCV: 90 fL (ref 79–97)
Monocytes Absolute: 0.4 x10E3/uL (ref 0.1–0.9)
Monocytes: 6 %
Neutrophils Absolute: 4.1 x10E3/uL (ref 1.4–7.0)
Neutrophils: 56 %
Platelets: 375 x10E3/uL (ref 150–450)
RBC: 4.48 x10E6/uL (ref 3.77–5.28)
RDW: 13.2 % (ref 11.7–15.4)
WBC: 7.2 x10E3/uL (ref 3.4–10.8)

## 2024-04-04 LAB — IGG 1, 2, 3, AND 4
IgG (Immunoglobin G), Serum: 1322 mg/dL (ref 586–1602)
IgG, Subclass 1: 586 mg/dL (ref 248–810)
IgG, Subclass 2: 223 mg/dL (ref 130–555)
IgG, Subclass 3: 47 mg/dL (ref 15–102)
IgG, Subclass 4: 72 mg/dL (ref 2–96)

## 2024-04-04 LAB — DIPHTHERIA / TETANUS ANTIBODY PANEL
Diphtheria Ab: <0.1 [IU]/mL — ABNORMAL LOW (ref <0.10)
Tetanus Ab, IgG: 1.32 [IU]/mL (ref <0.10)

## 2024-04-04 LAB — COMPLEMENT, TOTAL: Compl, Total (CH50): >60 U/mL (ref >41)

## 2024-04-07 ENCOUNTER — Ambulatory Visit: Payer: Self-pay | Admitting: Allergy & Immunology

## 2024-04-10 DIAGNOSIS — Z1211 Encounter for screening for malignant neoplasm of colon: Secondary | ICD-10-CM | POA: Diagnosis not present

## 2024-04-12 ENCOUNTER — Ambulatory Visit (INDEPENDENT_AMBULATORY_CARE_PROVIDER_SITE_OTHER)

## 2024-04-12 DIAGNOSIS — J309 Allergic rhinitis, unspecified: Secondary | ICD-10-CM | POA: Diagnosis not present

## 2024-04-12 DIAGNOSIS — I700 Atherosclerosis of aorta: Secondary | ICD-10-CM | POA: Diagnosis not present

## 2024-04-12 DIAGNOSIS — D649 Anemia, unspecified: Secondary | ICD-10-CM | POA: Diagnosis not present

## 2024-04-12 DIAGNOSIS — R7303 Prediabetes: Secondary | ICD-10-CM | POA: Diagnosis not present

## 2024-04-18 DIAGNOSIS — D649 Anemia, unspecified: Secondary | ICD-10-CM | POA: Diagnosis not present

## 2024-04-18 DIAGNOSIS — R7303 Prediabetes: Secondary | ICD-10-CM | POA: Diagnosis not present

## 2024-04-18 DIAGNOSIS — R5382 Chronic fatigue, unspecified: Secondary | ICD-10-CM | POA: Diagnosis not present

## 2024-04-18 DIAGNOSIS — Z23 Encounter for immunization: Secondary | ICD-10-CM | POA: Diagnosis not present

## 2024-04-18 DIAGNOSIS — E785 Hyperlipidemia, unspecified: Secondary | ICD-10-CM | POA: Diagnosis not present

## 2024-04-18 DIAGNOSIS — J302 Other seasonal allergic rhinitis: Secondary | ICD-10-CM | POA: Diagnosis not present

## 2024-04-18 DIAGNOSIS — F418 Other specified anxiety disorders: Secondary | ICD-10-CM | POA: Diagnosis not present

## 2024-04-18 DIAGNOSIS — I4510 Unspecified right bundle-branch block: Secondary | ICD-10-CM | POA: Diagnosis not present

## 2024-04-18 DIAGNOSIS — Z7185 Encounter for immunization safety counseling: Secondary | ICD-10-CM | POA: Diagnosis not present

## 2024-04-18 DIAGNOSIS — K219 Gastro-esophageal reflux disease without esophagitis: Secondary | ICD-10-CM | POA: Diagnosis not present

## 2024-04-18 DIAGNOSIS — H35343 Macular cyst, hole, or pseudohole, bilateral: Secondary | ICD-10-CM | POA: Diagnosis not present

## 2024-04-18 DIAGNOSIS — G43909 Migraine, unspecified, not intractable, without status migrainosus: Secondary | ICD-10-CM | POA: Diagnosis not present

## 2024-04-19 ENCOUNTER — Ambulatory Visit

## 2024-04-19 DIAGNOSIS — J302 Other seasonal allergic rhinitis: Secondary | ICD-10-CM | POA: Diagnosis not present

## 2024-04-19 DIAGNOSIS — J309 Allergic rhinitis, unspecified: Secondary | ICD-10-CM

## 2024-05-23 NOTE — Progress Notes (Unsigned)
 " Cardiology Office Note:    Date:  05/24/2024   ID:  Margaret Gilmore, DOB 1956/02/22, MRN [id number]  PCP:  Joeann Browning, FNP  Cardiologist:  None  Electrophysiologist:  None   Referring MD: Joeann Browning, FNP   Chief Complaint  Patient presents with   Shortness of Breath    History of Present Illness:    Margaret Gilmore is a 69 y.o. female with a hx of hyperlipidemia, prediabetes, GERD who is referred by Browning Joeann, NP for evaluation of abnormal EKG.  EKG at PCP showed right bundle branch block.  She denies any chest pain.  Does report some dyspnea with exertion, particularly notices it when walking uphill.  Reports lightheadedness but denies any syncope.  Reports some swelling in her ankles.  She denies any palpitations.  She has not been exercising.  Never smoked.  Family history includes father had MI in late 59s.  Past Medical History:  Diagnosis Date   Allergy    Anxiety    situational   Arthritis    B12 deficiency    hx of , no longer taking medication   Cataracts, bilateral    Constipation    Depression    situational   Esophageal stricture    Family history of adverse reaction to anesthesia    mom had problems waking up   Fatty liver    GERD (gastroesophageal reflux disease)    Headache    migraines    Macular hole of left eye    Retinal detachment with multiple breaks, right 07/27/2017   Trigeminal neuralgia    right side of face is numb   Unspecified vitamin D  deficiency     Past Surgical History:  Procedure Laterality Date   25 GAUGE PARS PLANA VITRECTOMY WITH 20 GAUGE MVR PORT Left 11/09/2017   25 GAUGE PARS PLANA VITRECTOMY WITH 20 GAUGE MVR PORT Left 11/09/2017   Procedure: 25 GAUGE PARS PLANA VITRECTOMY WITH 20 GAUGE MVR PORT;  Surgeon: Alvia Norleen BIRCH, MD;  Location: [facility] OR;  Service: Ophthalmology;  Laterality: Left;   25 GAUGE PARS PLANA VITRECTOMY WITH 20 GAUGE MVR PORT FOR MACULAR HOLE Left 03/05/2015   Procedure: 25 GAUGE PARS PLANA  VITRECTOMY WITH 20 GAUGE MVR PORT FOR MACULAR HOLE;  Surgeon: Norleen BIRCH Alvia, MD;  Location: [facility] OR;  Service: Ophthalmology;  Laterality: Left;   25 GAUGE PARS PLANA VITRECTOMY WITH 20 GAUGE MVR PORT FOR MACULAR HOLE Right 06/22/2017   25 GAUGE PARS PLANA VITRECTOMY WITH 20 GAUGE MVR PORT FOR MACULAR HOLE Right 06/22/2017   Procedure: 25 GAUGE PARS PLANA VITRECTOMY WITH 20 GAUGE MVR PORT FOR MACULAR HOLE;  Surgeon: Alvia Norleen BIRCH, MD;  Location: [facility] OR;  Service: Ophthalmology;  Laterality: Right;   ABDOMINAL HYSTERECTOMY     Total   APPENDECTOMY     CESAREAN SECTION     COLONOSCOPY     ESOPHAGOGASTRODUODENOSCOPY (EGD) WITH ESOPHAGEAL DILATION     EYE SURGERY     GAS/FLUID EXCHANGE Left 03/05/2015   Procedure: GAS/FLUID EXCHANGE;  Surgeon: Norleen BIRCH Alvia, MD;  Location: [facility] OR;  Service: Ophthalmology;  Laterality: Left;  CF38   GAS/FLUID EXCHANGE Right 06/22/2017   Procedure: GAS/FLUID EXCHANGE;  Surgeon: Alvia Norleen BIRCH, MD;  Location: [facility] OR;  Service: Ophthalmology;  Laterality: Right;   GAS/FLUID EXCHANGE Right 07/27/2017   Procedure: GAS/FLUID EXCHANGE;  Surgeon: Alvia Norleen BIRCH, MD;  Location: [facility] OR;  Service: Ophthalmology;  Laterality: Right;   GAS/FLUID EXCHANGE Left  11/09/2017   Procedure: GAS/FLUID EXCHANGE;  Surgeon: Alvia Norleen BIRCH, MD;  Location: [facility] OR;  Service: Ophthalmology;  Laterality: Left;   knot removed from right side of abdomen     LASER PHOTO ABLATION Left 03/05/2015   Procedure: LASER PHOTO ABLATION;  Surgeon: Norleen BIRCH Alvia, MD;  Location: [facility] OR;  Service: Ophthalmology;  Laterality: Left;  Headscope Laser   LASER PHOTO ABLATION Right 06/22/2017   Procedure: LASER PHOTO ABLATION;  Surgeon: Alvia Norleen BIRCH, MD;  Location: [facility] OR;  Service: Ophthalmology;  Laterality: Right;   LASER PHOTO ABLATION Right 07/27/2017   Procedure: LASER PHOTO ABLATION;  Surgeon: Alvia Norleen BIRCH, MD;  Location: [facility] OR;  Service: Ophthalmology;  Laterality: Right;   LASER PHOTO ABLATION Left  11/09/2017   Procedure: LASER PHOTO ABLATION;  Surgeon: Alvia Norleen BIRCH, MD;  Location: [facility] OR;  Service: Ophthalmology;  Laterality: Left;   MEMBRANE PEEL Left 03/05/2015   Procedure: MEMBRANE PEEL;  Surgeon: Norleen BIRCH Alvia, MD;  Location: [facility] OR;  Service: Ophthalmology;  Laterality: Left;   MEMBRANE PEEL Right 06/22/2017   Procedure: MEMBRANE PEEL;  Surgeon: Alvia Norleen BIRCH, MD;  Location: [facility] OR;  Service: Ophthalmology;  Laterality: Right;   MEMBRANE PEEL Right 07/27/2017   Procedure: MEMBRANE PEEL;  Surgeon: Alvia Norleen BIRCH, MD;  Location: [facility] OR;  Service: Ophthalmology;  Laterality: Right;   MEMBRANE PEEL Left 11/09/2017   Procedure: MEMBRANE PEEL;  Surgeon: Alvia Norleen BIRCH, MD;  Location: [facility] OR;  Service: Ophthalmology;  Laterality: Left;   SCLERAL BUCKLE WITH POSSIBLE 25 GAUGE PARS PLANA VITRECTOMY Right 07/27/2017   Procedure: SCLERAL BUCKLE  25 GAUGE PARS PLANA VITRECTOMY, C3F8 INJECTION REPAIR OF COMPLEX TRACTION OF RETINAL DETACHMENT;  Surgeon: Alvia Norleen BIRCH, MD;  Location: [facility] OR;  Service: Ophthalmology;  Laterality: Right;   SERUM PATCH Left 03/05/2015   Procedure: SERUM PATCH;  Surgeon: Norleen BIRCH Alvia, MD;  Location: [facility] OR;  Service: Ophthalmology;  Laterality: Left;   SERUM PATCH Right 06/22/2017   Procedure: SERUM PATCH;  Surgeon: Alvia Norleen BIRCH, MD;  Location: [facility] OR;  Service: Ophthalmology;  Laterality: Right;   SERUM PATCH Left 11/09/2017   Procedure: SERUM PATCH;  Surgeon: Alvia Norleen BIRCH, MD;  Location: [facility] OR;  Service: Ophthalmology;  Laterality: Left;   trigeminal       Current Medications: Active Medications[1]   Allergies:   Pregabalin, Diphenhydramine, Nitrous oxide, and Sulfa antibiotics   Social History   Socioeconomic History   Marital status: Single    Spouse name: Not on file   Number of children: 2   Years of education: Not on file   Highest education level: Not on file  Occupational History   Occupation: caregiver  Tobacco Use   Smoking status:  Never    Passive exposure: Current (neighbor smokes and smells smoke sometimes.)   Smokeless tobacco: Never  Vaping Use   Vaping status: Never Used  Substance and Sexual Activity   Alcohol use: No   Drug use: No   Sexual activity: Not Currently    Partners: Male    Birth control/protection: Post-menopausal, Surgical  Other Topics Concern   Not on file  Social History Narrative   Not on file   Social Drivers of Health   Tobacco Use: Medium Risk (05/24/2024)   Patient History    Smoking Tobacco Use: Never    Smokeless Tobacco Use: Never    Passive Exposure: Current  Financial Resource Strain: Not on file  Food Insecurity: Not  on file  Transportation Needs: Not on file  Physical Activity: Not on file  Stress: Not on file  Social Connections: Not on file  Depression (PHQ2-9): Low Risk (08/19/2022)   Depression (PHQ2-9)    PHQ-2 Score: 1  Alcohol Screen: Not on file  Housing: Not on file  Utilities: Not on file  Health Literacy: Not on file     Family History: The patient's family history includes Alzheimer's disease in her maternal aunt and maternal grandmother; Bone cancer in her maternal grandfather; Breast cancer in her maternal aunt; COPD in her mother; Cancer in her paternal uncle; Colon cancer in her paternal uncle; Diabetes in her mother; Heart disease in her father; Hodgkin's lymphoma in her paternal aunt; Hyperlipidemia in her father and mother; Hypertension in her brother and father; Pancreatic cancer (age of onset: 32) in her father; Prostate cancer in her maternal grandfather. There is no history of Esophageal cancer, Rectal cancer, or Stomach cancer.  ROS:   Please see the history of present illness.     All other systems reviewed and are negative.  EKGs/Labs/Other Studies Reviewed:    The following studies were reviewed today:   EKG:   05/24/2024: Normal sinus rhythm, rate 66, no ST abnormalities  Recent Labs: 03/29/2024: Hemoglobin 12.7; Platelets 375   Recent Lipid Panel    Component Value Date/Time   CHOL 200 (H) 03/16/2023 1500   TRIG 182 (H) 03/16/2023 1500   HDL 64 03/16/2023 1500   CHOLHDL 3.1 03/16/2023 1500   VLDL 18 01/23/2014 1653   LDLCALC 106 (H) 03/16/2023 1500    Physical Exam:    VS:  BP (!) 147/83   Pulse 66   Ht 5' 5 (1.651 m)   Wt 196 lb 3.2 oz (89 kg)   SpO2 94%   BMI 32.65 kg/m     Wt Readings from Last 3 Encounters:  05/24/24 196 lb 3.2 oz (89 kg)  03/29/24 196 lb 9.6 oz (89.2 kg)  05/18/23 191 lb (86.6 kg)     GEN:  Well nourished, well developed in no acute distress HEENT: Normal NECK: No JVD; No carotid bruits LYMPHATICS: No lymphadenopathy CARDIAC: RRR, no murmurs, rubs, gallops RESPIRATORY:  Clear to auscultation without rales, wheezing or rhonchi  ABDOMEN: Soft, non-tender, non-distended MUSCULOSKELETAL:  No edema; No deformity  SKIN: Warm and dry NEUROLOGIC:  Alert and oriented x 3 PSYCHIATRIC:  Normal affect   ASSESSMENT:    1. DOE (dyspnea on exertion)   2. Abnormal EKG   3. Right bundle branch block (RBBB)   4. Elevated BP reading w/ no diagnosis of HTN   5. Hyperlipidemia, unspecified hyperlipidemia type    PLAN:    Dyspnea on exertion: She is reporting dyspnea on exertion, concerning for anginal equivalent.  Does have multiple CAD risk factors (age, hyperlipidemia, family history).  Recommend coronary CTA to evaluate for obstructive CAD.  Will give Lopressor  50 mg prior to study  Abnormal EKG: Right bundle branch block on EKG at PCP.  No RBBB on EKG in clinic today.  May be rate related.  Will check echocardiogram to evaluate for any structural heart disease  Hyperlipidemia: LDL 133 on 04/12/24.  Will follow-up results on coronary CTA to guide how aggressive to be in lowering cholesterol  Elevated BP: No diagnosis of hypertension but BP elevated in clinic today.  Asked to check BP twice daily for next week and let us  know results  RTC in 6 months   Medication  Adjustments/Labs and  Tests Ordered: Current medicines are reviewed at length with the patient today.  Concerns regarding medicines are outlined above.  Orders Placed This Encounter  Procedures   CT CORONARY MORPH W/CTA COR W/SCORE W/CA W/CM &/OR WO/CM   Basic Metabolic Panel (BMET)   EKG 12-Lead   ECHOCARDIOGRAM COMPLETE   Meds ordered this encounter  Medications   metoprolol  tartrate (LOPRESSOR ) 50 MG tablet    Sig: Take 2 hours before CT scan    Dispense:  1 tablet    Refill:  0    Patient Instructions  Medication Instructions:  Your physician recommends that you continue on your current medications as directed. Please refer to the Current Medication list given to you today.  *If you need a refill on your cardiac medications before your next appointment, please call your pharmacy*  Lab Work: Bmet today If you have labs (blood work) drawn today and your tests are completely normal, you will receive your results only by: MyChart Message (if you have MyChart) OR A paper copy in the mail If you have any lab test that is abnormal or we need to change your treatment, we will call you to review the results.  Testing/Procedures:   Your cardiac CT will be scheduled at one of the below locations:    [facility] [address]  [place], [place] [address]   If scheduled at [facility], please arrive at the [facility] and Children's Entrance (Entrance C2) of [facility] 30 minutes prior to test start time. You can use the FREE valet parking offered at entrance C (encouraged to control the heart rate for the test)  Proceed to the [facility] Radiology Department (first floor) to check-in and test prep.  All radiology patients and guests should use entrance C2 at [facility], accessed from [facility], even though the hospital's physical address listed is [address].  If scheduled at the Heart and Vascular Tower  at [address], please enter the parking lot using the [address] entrance and use the FREE valet service at the patient drop-off area. Enter the building and check-in with registration on the main floor.    Please follow these instructions carefully (unless otherwise directed):  An IV will be required for this test and Nitroglycerin will be given.)   On the Night Before the Test: Be sure to Drink plenty of water . Do not consume any caffeinated/decaffeinated beverages or chocolate 12 hours prior to your test. Do not take any antihistamines 12 hours prior to your test.  On the Day of the Test: Drink plenty of water  until 1 hour prior to the test. Do not eat any food 1 hour prior to test. You may take your regular medications prior to the test.  Take metoprolol  (Lopressor )50mg   two hours prior to test. If you take Furosemide /Hydrochlorothiazide/Spironolactone/Chlorthalidone, please HOLD on the morning of the test. Patients who wear a continuous glucose monitor MUST remove the device prior to scanning. FEMALES- please wear underwire-free bra if available, avoid dresses & tight clothing      After the Test: Drink plenty of water . After receiving IV contrast, you may experience a mild flushed feeling. This is normal. On occasion, you may experience a mild rash up to 24 hours after the test. This is not dangerous. If this occurs, you can take Benadryl 25 mg, Zyrtec, Claritin, or Allegra and increase your fluid intake. (Patients taking Tikosyn should avoid Benadryl, and may take  Zyrtec, Claritin, or Allegra) If you experience trouble breathing, this can be serious. If it is severe call 911 IMMEDIATELY. If it is mild, please call our office.  We will call to schedule your test 2-4 weeks out understanding that some insurance companies will need an authorization prior to the service being performed.   For more information and frequently asked questions, please visit our website :  [email or web address]  For non-scheduling related questions, please contact the cardiac imaging nurse navigator should you have any questions/concerns: Cardiac Imaging Nurse Navigators Direct Office Dial: [phone]   For scheduling needs, including cancellations and rescheduling, please call Brittany, [phone].   Follow-Up: At [facility], you and your health needs are our priority.  As part of our continuing mission to provide you with exceptional heart care, our providers are all part of one team.  This team includes your primary Cardiologist (physician) and Advanced Practice Providers or APPs (Physician Assistants and Nurse Practitioners) who all work together to provide you with the care you need, when you need it.  Your next appointment:   6 months  Provider:   Dr. Kate  We recommend signing up for the patient portal called MyChart.  Sign up information is provided on this After Visit Summary.  MyChart is used to connect with patients for Virtual Visits (Telemedicine).  Patients are able to view lab/test results, encounter notes, upcoming appointments, etc.  Non-urgent messages can be sent to your provider as well.   To learn more about what you can do with MyChart, go to [email or web address].   Other Instructions Echo  Your physician has requested that you have an echocardiogram. Echocardiography is a painless test that uses sound waves to create images of your heart. It provides your doctor with information about the size and shape of your heart and how well your hearts chambers and valves are working. This procedure takes approximately one hour. There are no restrictions for this procedure. Please do NOT wear cologne, perfume, aftershave, or lotions (deodorant is allowed). Please arrive 15 minutes prior to your appointment time.  Please note: We ask at that you not bring children with you during ultrasound (echo/ vascular) testing. Due to room  size and safety concerns, children are not allowed in the ultrasound rooms during exams. Our front office staff cannot provide observation of children in our lobby area while testing is being conducted. An adult accompanying a patient to their appointment will only be allowed in the ultrasound room at the discretion of the ultrasound technician under special circumstances. We apologize for any inconvenience.   Please check blood pressure twice a day for next week and send those readings           Signed, Lonni LITTIE Kate, MD  05/24/2024 5:11 PM    [facility]     [1]  Current Meds  Medication Sig   Acetaminophen  (TYLENOL  PO) Take by mouth.   albuterol  (VENTOLIN  HFA) 108 (90 Base) MCG/ACT inhaler Inhale 2 puffs into the lungs every 6 (six) hours as needed for wheezing or shortness of breath.   Ascorbic Acid (VITAMIN C PO) Take 1 tablet by mouth daily.   ASHWAGANDHA GUMMIES PO Take 2 tablets by mouth daily.   azelastine  (ASTELIN ) 0.1 % nasal spray Apply 1 spray each nostril twice a day AS NEEDED for runny nose/drainage down throat.   Cholecalciferol (VITAMIN D3) 25 MCG (1000 UT) CAPS Take 1 capsule by mouth daily.   EPINEPHrine  0.3 mg/0.3 mL IJ  SOAJ injection Inject 0.3 mg into the muscle as needed for anaphylaxis.   esomeprazole  (NEXIUM ) 40 MG capsule Take  1 capsule   Daily  to Prevent Heartburn & Indigestion   FLUoxetine (PROZAC) 10 MG capsule Take 10 mg by mouth daily.   levocetirizine (XYZAL ) 5 MG tablet Take 1 tablet (5 mg total) by mouth daily as needed for allergies (Can take an extra dose during flare ups.).   metoprolol  tartrate (LOPRESSOR ) 50 MG tablet Take 2 hours before CT scan   Multiple Vitamins-Minerals (MULTIVITAMIN WITH MINERALS) tablet Take 1 tablet by mouth daily.   Omega-3 Fatty Acids (FISH OIL) 1200 MG CAPS Take 2 capsules by mouth daily.   Polyethylene Glycol 3350 (MIRALAX PO) Take 17 g by mouth as needed.    promethazine -dextromethorphan (PROMETHAZINE -DM) 6.25-15 MG/5ML syrup Take 5 mLs by mouth 4 (four) times daily as needed for cough.   vitamin B-12 (CYANOCOBALAMIN ) 500 MCG tablet Take 500 mcg by mouth daily.   "

## 2024-05-24 ENCOUNTER — Ambulatory Visit: Attending: Cardiology | Admitting: Cardiology

## 2024-05-24 ENCOUNTER — Encounter: Payer: Self-pay | Admitting: Cardiology

## 2024-05-24 VITALS — BP 147/83 | HR 66 | Ht 65.0 in | Wt 196.2 lb

## 2024-05-24 DIAGNOSIS — R030 Elevated blood-pressure reading, without diagnosis of hypertension: Secondary | ICD-10-CM | POA: Diagnosis not present

## 2024-05-24 DIAGNOSIS — E785 Hyperlipidemia, unspecified: Secondary | ICD-10-CM | POA: Diagnosis not present

## 2024-05-24 DIAGNOSIS — I4510 Unspecified right bundle-branch block: Secondary | ICD-10-CM | POA: Diagnosis not present

## 2024-05-24 DIAGNOSIS — R9431 Abnormal electrocardiogram [ECG] [EKG]: Secondary | ICD-10-CM

## 2024-05-24 DIAGNOSIS — R0609 Other forms of dyspnea: Secondary | ICD-10-CM

## 2024-05-24 MED ORDER — METOPROLOL TARTRATE 50 MG PO TABS: ORAL_TABLET | ORAL | 0 refills | Status: AC

## 2024-05-24 NOTE — Patient Instructions (Signed)
 Medication Instructions:  Your physician recommends that you continue on your current medications as directed. Please refer to the Current Medication list given to you today.  *If you need a refill on your cardiac medications before your next appointment, please call your pharmacy*  Lab Work: Bmet today If you have labs (blood work) drawn today and your tests are completely normal, you will receive your results only by: MyChart Message (if you have MyChart) OR A paper copy in the mail If you have any lab test that is abnormal or we need to change your treatment, we will call you to review the results.  Testing/Procedures:   Your cardiac CT will be scheduled at one of the below locations:    [facility] [address]  [place], [place] [address]   If scheduled at [facility], please arrive at the [facility] and Children's Entrance (Entrance C2) of [facility] 30 minutes prior to test start time. You can use the FREE valet parking offered at entrance C (encouraged to control the heart rate for the test)  Proceed to the [facility] Radiology Department (first floor) to check-in and test prep.  All radiology patients and guests should use entrance C2 at [facility], accessed from [facility], even though the hospital's physical address listed is [address]  If scheduled at the Heart and Vascular Tower at [address], please enter the parking lot using the [address] entrance and use the FREE valet service at the patient drop-off area. Enter the building and check-in with registration on the main floor.    Please follow these instructions carefully (unless otherwise directed):  An IV will be required for this test and Nitroglycerin will be given.)   On the Night Before the Test: Be sure to Drink plenty of water . Do not consume any caffeinated/decaffeinated beverages or chocolate 12 hours prior to your  test. Do not take any antihistamines 12 hours prior to your test.  On the Day of the Test: Drink plenty of water  until 1 hour prior to the test. Do not eat any food 1 hour prior to test. You may take your regular medications prior to the test.  Take metoprolol  (Lopressor )50mg   two hours prior to test. If you take Furosemide /Hydrochlorothiazide/Spironolactone/Chlorthalidone, please HOLD on the morning of the test. Patients who wear a continuous glucose monitor MUST remove the device prior to scanning. FEMALES- please wear underwire-free bra if available, avoid dresses & tight clothing      After the Test: Drink plenty of water . After receiving IV contrast, you may experience a mild flushed feeling. This is normal. On occasion, you may experience a mild rash up to 24 hours after the test. This is not dangerous. If this occurs, you can take Benadryl 25 mg, Zyrtec, Claritin, or Allegra and increase your fluid intake. (Patients taking Tikosyn should avoid Benadryl, and may take Zyrtec, Claritin, or Allegra) If you experience trouble breathing, this can be serious. If it is severe call 911 IMMEDIATELY. If it is mild, please call our office.  We will call to schedule your test 2-4 weeks out understanding that some insurance companies will need an authorization prior to the service being performed.   For more information and frequently asked questions, please visit our website : [email or web address]  For non-scheduling related questions, please contact the cardiac imaging nurse navigator should you have any questions/concerns: Cardiac Imaging Nurse Navigators Direct Office Dial: [phone]   For  scheduling needs, including cancellations and rescheduling, please call Brittany, [phone].   Follow-Up: At [facility], you and your health needs are our priority.  As part of our continuing mission to provide you with exceptional heart care, our providers are all part of  one team.  This team includes your primary Cardiologist (physician) and Advanced Practice Providers or APPs (Physician Assistants and Nurse Practitioners) who all work together to provide you with the care you need, when you need it.  Your next appointment:   6 months  Provider:   Dr. Kate  We recommend signing up for the patient portal called MyChart.  Sign up information is provided on this After Visit Summary.  MyChart is used to connect with patients for Virtual Visits (Telemedicine).  Patients are able to view lab/test results, encounter notes, upcoming appointments, etc.  Non-urgent messages can be sent to your provider as well.   To learn more about what you can do with MyChart, go to [email or web address].   Other Instructions Echo  Your physician has requested that you have an echocardiogram. Echocardiography is a painless test that uses sound waves to create images of your heart. It provides your doctor with information about the size and shape of your heart and how well your hearts chambers and valves are working. This procedure takes approximately one hour. There are no restrictions for this procedure. Please do NOT wear cologne, perfume, aftershave, or lotions (deodorant is allowed). Please arrive 15 minutes prior to your appointment time.  Please note: We ask at that you not bring children with you during ultrasound (echo/ vascular) testing. Due to room size and safety concerns, children are not allowed in the ultrasound rooms during exams. Our front office staff cannot provide observation of children in our lobby area while testing is being conducted. An adult accompanying a patient to their appointment will only be allowed in the ultrasound room at the discretion of the ultrasound technician under special circumstances. We apologize for any inconvenience.   Please check blood pressure twice a day for next week and send those readings

## 2024-05-25 ENCOUNTER — Ambulatory Visit: Payer: Self-pay | Admitting: Cardiology

## 2024-05-25 LAB — BASIC METABOLIC PANEL WITH GFR
BUN/Creatinine Ratio: 22 (ref 12–28)
BUN: 20 mg/dL (ref 8–27)
CO2: 22 mmol/L (ref 20–29)
Calcium: 9.7 mg/dL (ref 8.7–10.3)
Chloride: 103 mmol/L (ref 96–106)
Creatinine, Ser: 0.92 mg/dL (ref 0.57–1.00)
Glucose: 82 mg/dL (ref 70–99)
Potassium: 4.2 mmol/L (ref 3.5–5.2)
Sodium: 140 mmol/L (ref 134–144)
eGFR: 67 mL/min/1.73

## 2024-05-29 ENCOUNTER — Encounter (HOSPITAL_COMMUNITY): Payer: Self-pay

## 2024-05-31 ENCOUNTER — Ambulatory Visit (HOSPITAL_COMMUNITY)
Admission: RE | Admit: 2024-05-31 | Discharge: 2024-05-31 | Disposition: A | Discharge status: Home / Self Care | Source: Ambulatory Visit | Attending: Cardiology | Admitting: Cardiology

## 2024-05-31 DIAGNOSIS — R931 Abnormal findings on diagnostic imaging of heart and coronary circulation: Secondary | ICD-10-CM | POA: Diagnosis present

## 2024-05-31 DIAGNOSIS — R0609 Other forms of dyspnea: Secondary | ICD-10-CM | POA: Diagnosis present

## 2024-05-31 DIAGNOSIS — I2510 Atherosclerotic heart disease of native coronary artery without angina pectoris: Secondary | ICD-10-CM | POA: Diagnosis not present

## 2024-05-31 MED ORDER — NITROGLYCERIN 0.4 MG SL SUBL
0.8000 mg | SUBLINGUAL_TABLET | Freq: Once | SUBLINGUAL | Status: AC
  Administered 2024-05-31: 0.8 mg via SUBLINGUAL

## 2024-05-31 MED ORDER — IOHEXOL 350 MG/ML SOLN
100.0000 mL | Freq: Once | INTRAVENOUS | Status: AC | PRN
  Administered 2024-05-31: 100 mL via INTRAVENOUS

## 2024-06-01 ENCOUNTER — Other Ambulatory Visit (HOSPITAL_BASED_OUTPATIENT_CLINIC_OR_DEPARTMENT_OTHER): Payer: Self-pay | Admitting: Internal Medicine

## 2024-06-01 ENCOUNTER — Ambulatory Visit (HOSPITAL_BASED_OUTPATIENT_CLINIC_OR_DEPARTMENT_OTHER): Payer: Self-pay | Admitting: Internal Medicine

## 2024-06-01 ENCOUNTER — Ambulatory Visit (HOSPITAL_BASED_OUTPATIENT_CLINIC_OR_DEPARTMENT_OTHER)
Admission: RE | Admit: 2024-06-01 | Discharge: 2024-06-01 | Disposition: A | Source: Ambulatory Visit | Attending: Internal Medicine | Admitting: Internal Medicine

## 2024-06-01 DIAGNOSIS — R931 Abnormal findings on diagnostic imaging of heart and coronary circulation: Secondary | ICD-10-CM

## 2024-06-01 NOTE — Progress Notes (Signed)
CT sent for FFR 

## 2024-06-03 ENCOUNTER — Encounter: Payer: Self-pay | Admitting: Cardiology

## 2024-06-07 ENCOUNTER — Other Ambulatory Visit: Payer: Self-pay

## 2024-06-07 ENCOUNTER — Ambulatory Visit

## 2024-06-07 DIAGNOSIS — J302 Other seasonal allergic rhinitis: Secondary | ICD-10-CM | POA: Diagnosis not present

## 2024-06-07 DIAGNOSIS — E785 Hyperlipidemia, unspecified: Secondary | ICD-10-CM

## 2024-06-07 MED ORDER — ROSUVASTATIN CALCIUM 10 MG PO TABS: 10.0000 mg | ORAL_TABLET | Freq: Every day | ORAL | 3 refills | Status: AC

## 2024-07-05 ENCOUNTER — Ambulatory Visit: Admitting: Allergy & Immunology

## 2024-08-24 ENCOUNTER — Ambulatory Visit (HOSPITAL_COMMUNITY)
